# Patient Record
Sex: Male | Born: 1945
Health system: Southern US, Community
[De-identification: ages and names within clinical notes are randomized; demographics above are authoritative.]

## PROBLEM LIST (undated history)

## (undated) DIAGNOSIS — C9110 Chronic lymphocytic leukemia of B-cell type not having achieved remission: Secondary | ICD-10-CM

## (undated) DIAGNOSIS — N529 Male erectile dysfunction, unspecified: Secondary | ICD-10-CM

## (undated) DIAGNOSIS — R011 Cardiac murmur, unspecified: Secondary | ICD-10-CM

## (undated) DIAGNOSIS — I10 Essential (primary) hypertension: Secondary | ICD-10-CM

## (undated) DIAGNOSIS — S7290XA Unspecified fracture of unspecified femur, initial encounter for closed fracture: Secondary | ICD-10-CM

## (undated) DIAGNOSIS — I4949 Other premature depolarization: Secondary | ICD-10-CM

## (undated) DIAGNOSIS — E559 Vitamin D deficiency, unspecified: Secondary | ICD-10-CM

## (undated) DIAGNOSIS — H269 Unspecified cataract: Secondary | ICD-10-CM

## (undated) DIAGNOSIS — D509 Iron deficiency anemia, unspecified: Secondary | ICD-10-CM

## (undated) DIAGNOSIS — K635 Polyp of colon: Secondary | ICD-10-CM

## (undated) DIAGNOSIS — E669 Obesity, unspecified: Secondary | ICD-10-CM

## (undated) DIAGNOSIS — Z8719 Personal history of other diseases of the digestive system: Secondary | ICD-10-CM

## (undated) DIAGNOSIS — E785 Hyperlipidemia, unspecified: Secondary | ICD-10-CM

## (undated) DIAGNOSIS — E119 Type 2 diabetes mellitus without complications: Secondary | ICD-10-CM

## (undated) HISTORY — DX: Unspecified fracture of unspecified femur, initial encounter for closed fracture: S72.90XA

## (undated) HISTORY — DX: Obesity, unspecified: E66.9

## (undated) HISTORY — PX: HERNIA REPAIR: SHX51

## (undated) HISTORY — DX: Hyperlipidemia, unspecified: E78.5

## (undated) HISTORY — DX: Polyp of colon: K63.5

## (undated) HISTORY — PX: OTHER SURGICAL HISTORY: SHX169

## (undated) HISTORY — DX: Male erectile dysfunction, unspecified: N52.9

## (undated) HISTORY — DX: Iron deficiency anemia, unspecified: D50.9

## (undated) HISTORY — DX: Vitamin D deficiency, unspecified: E55.9

## (undated) HISTORY — DX: Unspecified cataract: H26.9

## (undated) HISTORY — DX: Essential (primary) hypertension: I10

## (undated) HISTORY — DX: Other premature depolarization: I49.49

## (undated) HISTORY — DX: Type 2 diabetes mellitus without complications: E11.9

---

## 1981-09-17 HISTORY — PX: OTHER SURGICAL HISTORY: SHX169

## 1990-09-17 DIAGNOSIS — E119 Type 2 diabetes mellitus without complications: Secondary | ICD-10-CM

## 1990-09-17 HISTORY — DX: Type 2 diabetes mellitus without complications: E11.9

## 1998-12-06 ENCOUNTER — Ambulatory Visit (HOSPITAL_COMMUNITY): Admission: RE | Admit: 1998-12-06 | Discharge: 1998-12-06 | Payer: Self-pay | Admitting: *Deleted

## 1999-12-18 ENCOUNTER — Ambulatory Visit (HOSPITAL_COMMUNITY): Admission: RE | Admit: 1999-12-18 | Discharge: 1999-12-18 | Payer: Self-pay | Admitting: *Deleted

## 2001-04-15 ENCOUNTER — Encounter: Payer: Self-pay | Admitting: Internal Medicine

## 2001-04-15 ENCOUNTER — Ambulatory Visit (HOSPITAL_COMMUNITY): Admission: RE | Admit: 2001-04-15 | Discharge: 2001-04-15 | Payer: Self-pay | Admitting: Internal Medicine

## 2001-06-10 ENCOUNTER — Encounter: Payer: Self-pay | Admitting: Gastroenterology

## 2005-01-02 ENCOUNTER — Encounter: Admission: RE | Admit: 2005-01-02 | Discharge: 2005-04-02 | Payer: Self-pay | Admitting: Internal Medicine

## 2008-08-10 ENCOUNTER — Ambulatory Visit (HOSPITAL_COMMUNITY): Admission: RE | Admit: 2008-08-10 | Discharge: 2008-08-10 | Payer: Self-pay | Admitting: Internal Medicine

## 2009-04-19 ENCOUNTER — Ambulatory Visit (HOSPITAL_COMMUNITY): Admission: RE | Admit: 2009-04-19 | Discharge: 2009-04-19 | Payer: Self-pay | Admitting: Internal Medicine

## 2009-09-28 ENCOUNTER — Encounter: Payer: Self-pay | Admitting: Cardiology

## 2009-10-19 ENCOUNTER — Ambulatory Visit: Payer: Self-pay | Admitting: Cardiology

## 2009-10-24 ENCOUNTER — Telehealth (INDEPENDENT_AMBULATORY_CARE_PROVIDER_SITE_OTHER): Payer: Self-pay

## 2009-10-25 ENCOUNTER — Ambulatory Visit: Payer: Self-pay | Admitting: Cardiology

## 2009-10-25 ENCOUNTER — Encounter (HOSPITAL_COMMUNITY): Admission: RE | Admit: 2009-10-25 | Discharge: 2009-12-21 | Payer: Self-pay | Admitting: Cardiology

## 2009-10-25 ENCOUNTER — Ambulatory Visit: Payer: Self-pay

## 2009-11-04 ENCOUNTER — Ambulatory Visit: Payer: Self-pay | Admitting: Cardiology

## 2009-11-22 ENCOUNTER — Encounter: Payer: Self-pay | Admitting: Gastroenterology

## 2010-01-04 ENCOUNTER — Ambulatory Visit: Payer: Self-pay | Admitting: Gastroenterology

## 2010-01-04 DIAGNOSIS — Z794 Long term (current) use of insulin: Secondary | ICD-10-CM

## 2010-01-04 DIAGNOSIS — E1122 Type 2 diabetes mellitus with diabetic chronic kidney disease: Secondary | ICD-10-CM | POA: Insufficient documentation

## 2010-01-04 DIAGNOSIS — N183 Chronic kidney disease, stage 3 (moderate): Secondary | ICD-10-CM

## 2010-03-02 ENCOUNTER — Ambulatory Visit: Payer: Self-pay | Admitting: Gastroenterology

## 2010-03-09 ENCOUNTER — Encounter: Payer: Self-pay | Admitting: Gastroenterology

## 2010-10-08 ENCOUNTER — Encounter: Payer: Self-pay | Admitting: Internal Medicine

## 2010-10-17 NOTE — Procedures (Signed)
Summary: Colonoscopy   Colonoscopy  Procedure date:  06/10/2001  Findings:      Location:  [facility].   Patient Name: Nathan, Davis MRN:  Procedure Procedures: Colorectal cancer screening, average risk CPT: G0121.  Personnel: Endoscopist: Barbette Hair. Arlyce Dice, MD.  Referred By: Marinus Maw, MD.  Exam Location: Exam performed in Outpatient Clinic. Outpatient  Patient Consent: Procedure, Alternatives, Risks and Benefits discussed, consent obtained, from patient.  Indications  Average Risk Screening Routine.  History  Pre-Exam Physical: Performed Jun 10, 2001. Cardio-pulmonary exam, Rectal exam, HEENT exam , Abdominal exam, Extremity exam, Neurological exam, Mental status exam WNL.  Exam Exam: Extent of exam reached: Cecum, extent intended: Cecum.  ASA Classification: II. Tolerance: good.  Monitoring: Pulse and BP monitoring, Oximetry used. Supplemental O2 given.  Colon Prep Used Golytely for colon prep. Prep results: good.  Sedation Meds: Patient assessed and found to be appropriate for moderate (conscious) sedation. Fentanyl 100 mcg. Versed 10 mg.  Findings DIVERTICULOSIS: Sigmoid Colon. ICD9: Sep 15, 1898.  DIVERTICULOSIS: Sigmoid Colon. ICD9: Sep 15, 1898.   Assessment Abnormal examination, see findings above.  Diagnoses: 562.10: Diverticulosis.   Events  Unplanned Interventions: No intervention was required.  Unplanned Events: There were no complications. Plans Patient Education: Patient given standard instructions for: a normal exam.  Disposition: After procedure patient sent to recovery. After recovery patient sent home.  Scheduling/Referral: Colonoscopy, to Barbette Hair. Arlyce Dice, MD, around Jun 11, 2011.    CC: Lucky Cowboy, MD  This report was created from the original endoscopy report, which was reviewed and signed by the above listed endoscopist.

## 2010-10-17 NOTE — Assessment & Plan Note (Signed)
Summary: np6/chest heaviness/premature beats/eval for stress test/jml   Primary Provider:  Dr. Elisabeth Most   History of Present Illness: 65 yo with history of diabetes, HTN, and hyperlipidemia presents for evaluation of heart fluttering.  Patient had some nasal congestion earlier this month so started taking Sudafed.  He began to develop fluttering in his chest after this.  The fluttering was documented at Dr. Hardie Pulley office to be PVCs.  He was started on atenolol with resolution of the flutters.  He is very active.  He can walk as far as he wants on flat ground or up a flight of steps without shortness of breath.  No chest pain or tightness.  No lightheadedness or syncope.    ECG: NSR at 56, 1st degree AV block, nonspecific ST changes  Labs (11/10): K 4.3, creatinine 1.01, LDL 64, HDL 37, TSH normal  Current Medications (verified): 1)  Metformin Hcl 500 Mg Tabs (Metformin Hcl) .... Take One Tablet Two Times A Day 2)  Loratadine 10 Mg Tabs (Loratadine) .... Take One Tablet Once Daily 3)  Lipitor 20 Mg Tabs (Atorvastatin Calcium) .... Take One Tablet Once Daily 4)  Slow Fe 160 (50 Fe) Mg Cr-Tabs (Ferrous Sulfate Dried) .... Once Daily 5)  Claritin 10 Mg Tabs (Loratadine) .... Take One Tablet As Needed 6)  Aspirin 81 Mg Tabs (Aspirin) .... Once Daily 7)  Atenolol 25 Mg Tabs (Atenolol) .... Take One Tablet Once Daily  Allergies (verified): 1)  ! * Actos 2)  ! Codeine  Past History:  Past Medical History: 1. PREMATURE BEATS (ICD-427.60) 2. HTN 3. Diabetes: since 1992 4. Hyperlipidemia 5. Obesity 6.  Fe-deficiency anemia  Family History: HTN Grandmother with MI in her 47s.   Social History: Never smoked.  Married with children.  Works for [employer] [place] at the [employer].   Review of Systems       All systems reviewed and negative except as per HPI.   Vital Signs:  Patient profile:   65 year old male Height:      68 inches Weight:      217 pounds BMI:      33.11 Pulse rate:   56 / minute Pulse rhythm:   regular BP sitting:   120 / 74  (left arm) Cuff size:   large  Vitals Entered By: Judithe Modest CMA (October 19, 2009 2:49 PM)  Physical Exam  General:  Well developed, well nourished, in no acute distress. Head:  normocephalic and atraumatic Nose:  no deformity, discharge, inflammation, or lesions Mouth:  Teeth, gums and palate normal. Oral mucosa normal. Neck:  Neck supple, no JVD. No masses, thyromegaly or abnormal cervical nodes. Lungs:  Clear bilaterally to auscultation and percussion. Heart:  Non-displaced PMI, chest non-tender; regular rate and rhythm, S1, S2 without murmurs, rubs or gallops. Carotid upstroke normal, no bruit.  Pedals normal pulses. No edema, no varicosities. Abdomen:  Bowel sounds positive; abdomen soft and non-tender without masses, organomegaly, or hernias noted. No hepatosplenomegaly. Msk:  Back normal, normal gait. Muscle strength and tone normal. Extremities:  No clubbing or cyanosis. Neurologic:  Alert and oriented x 3. Skin:  Intact without lesions or rashes. Psych:  Normal affect.   Impression & Recommendations:  Problem # 1:  PREMATURE BEATS (ICD-427.60) Patient was having frequent fluttering that appeared to be temporally linked to PVCs found on ECG at Dr. Hardie Pulley office.  Symptoms have resolved with atenolol.  Sudafed (contains phenylephrine) could have been a potential source for  the fluttering.  - Given PVCs + multiple cardiac risk factors including long-standing diabetes, I will risk stratify him with an ETT-myoview.  - Avoid over the counter cold meds.   Problem # 2:  HYPERLIPIDEMIA-MIXED (ICD-272.4) Lipids at goal in 11/10.   Other Orders: EKG w/ Interpretation (93000) Nuclear Stress Test (Nuc Stress Test)  Patient Instructions: 1)  Your physician has requested that you have an exercise stress myoview.  For further information please visit [email or web address]  Please follow  instruction sheet, as given. 2)  Your physician recommends that you schedule a follow-up appointment in: 2 weeks with Dr. Marca Ancona

## 2010-10-17 NOTE — Letter (Signed)
Summary: Diabetic Instructions  [facility]  [address] [place], [place] [address]   Phone: [phone]  Fax: [phone]    Nathan Davis 1946-06-11 MRN: [id number]   X    ORAL DIABETIC MEDICATION INSTRUCTIONS                                      METFORMIN The day before your procedure:   Take your diabetic pill as you do normally  The day of your procedure:   Do not take your diabetic pill    We will check your blood sugar levels during the admission process and again in Recovery before discharging you home  ________________________________________________________________________  _  _   INSULIN (LONG ACTING) MEDICATION INSTRUCTIONS (Lantus, NPH, 70/30, Humulin, Novolin-N)   The day before your procedure:   Take  your regular evening dose    The day of your procedure:   Do not take your morning dose    _  _   INSULIN (SHORT ACTING) MEDICATION INSTRUCTIONS (Regular, Humulog, Novolog)   The day before your procedure:   Do not take your evening dose   The day of your procedure:   Do not take your morning dose   _  _   INSULIN PUMP MEDICATION INSTRUCTIONS  We will contact the physician managing your diabetic care for written dosage instructions for the day before your procedure and the day of your procedure.  Once we have received the instructions, we will contact you.

## 2010-10-17 NOTE — Letter (Signed)
Summary: Moviprep Instructions  [facility]  [address] [facility] [address]   [place], [place] [address]   Phone: [phone]  Fax: [phone]       Nathan LarsenGEORGE Davis    1945/09/19    MRN: [id number]        Procedure Day /Date:THURSDAY 01/26/2010     Arrival Time:10:30AM     Procedure Time:11:30AM     Location of Procedure:                    X   [facility]oscopy Center (4th Floor)                        PREPARATION FOR COLONOSCOPY WITH MOVIPREP   Starting 5 days prior to your procedure 01/21/2010 do not eat nuts, seeds, popcorn, corn, beans, peas,  salads, or any raw vegetables.  Do not take any fiber supplements (e.g. Metamucil, Citrucel, and Benefiber).  THE DAY BEFORE YOUR PROCEDURE         DATE: 01/25/2010 DAY: WED  1.  Drink clear liquids the entire day-NO SOLID FOOD  2.  Do not drink anything colored red or purple.  Avoid juices with pulp.  No orange juice.  3.  Drink at least 64 oz. (8 glasses) of fluid/clear liquids during the day to prevent dehydration and help the prep work efficiently.  CLEAR LIQUIDS INCLUDE: Water Jello Ice Popsicles Tea (sugar ok, no milk/cream) Powdered fruit flavored drinks Coffee (sugar ok, no milk/cream) Gatorade Juice: apple, white grape, white cranberry  Lemonade Clear bullion, consomm, broth Carbonated beverages (any kind) Strained chicken noodle soup Hard Candy                             4.  In the morning, mix first dose of MoviPrep solution:    Empty 1 Pouch A and 1 Pouch B into the disposable container    Add lukewarm drinking water to the top line of the container. Mix to dissolve    Refrigerate (mixed solution should be used within 24 hrs)  5.  Begin drinking the prep at 5:00 p.m. The MoviPrep container is divided by 4 marks.   Every 15 minutes drink the solution down to the next mark (approximately 8 oz) until the full liter is complete.   6.  Follow completed prep with 16 oz of clear liquid of your choice (Nothing  red or purple).  Continue to drink clear liquids until bedtime.  7.  Before going to bed, mix second dose of MoviPrep solution:    Empty 1 Pouch A and 1 Pouch B into the disposable container    Add lukewarm drinking water to the top line of the container. Mix to dissolve    Refrigerate  THE DAY OF YOUR PROCEDURE      DATE: 01/26/2010 DAY: THURSDAY  Beginning at 6:30a.m. (5 hours before procedure):         1. Every 15 minutes, drink the solution down to the next mark (approx 8 oz) until the full liter is complete.  2. Follow completed prep with 16 oz. of clear liquid of your choice.    3. You may drink clear liquids until 9:30AM(2 HOURS BEFORE PROCEDURE).   MEDICATION INSTRUCTIONS  Unless otherwise instructed, you should take regular prescription medications with a small sip of water   as early as possible the morning of your procedure.  Diabetic patients - see separate instructions.  HOLD IRON 7 DAYS PRIOR TO PROCEDURE         OTHER INSTRUCTIONS  You will need a responsible adult at least 66 years of age to accompany you and drive you home.   This person must remain in the waiting room during your procedure.  Wear loose fitting clothing that is easily removed.  Leave jewelry and other valuables at home.  However, you may wish to bring a book to read or  an iPod/MP3 player to listen to music as you wait for your procedure to start.  Remove all body piercing jewelry and leave at home.  Total time from sign-in until discharge is approximately 2-3 hours.  You should go home directly after your procedure and rest.  You can resume normal activities the  day after your procedure.  The day of your procedure you should not:   Drive   Make legal decisions   Operate machinery   Drink alcohol   Return to work  You will receive specific instructions about eating, activities and medications before you leave.    The above instructions have been reviewed and  explained to me by   _______________________    I fully understand and can verbalize these instructions _____________________________ Date _________

## 2010-10-17 NOTE — Letter (Signed)
Summary: Results Letter  [facility]  [address] [place], [place] [address]   Phone: [phone]  Fax: [phone]        March 09, 2010 MRN: [id number]    Nathan Davis [address] [place], [place]  [address]    Dear Mr. Nathan Davis,  Your colon biopsy results did not show any remarkable findings. I recommend that you undergo followup surveillance colonloscopy in 10 years. Please continue with the recommendations previously discussed.  Should you have any further questions or immediate concers, feel free to contact me.  Sincerely,  Barbette Hair. Arlyce Dice, M.D., [facility], [place]          Sincerely,  Louis Meckel MD  This letter has been electronically signed by your physician.  Appended Document: Results Letter letter mailed 6.24.11

## 2010-10-17 NOTE — Assessment & Plan Note (Signed)
Summary: ABNORMAL LABS/YF   History of Present Illness Visit Type: consult  Primary GI MD: Melvia Heaps MD [facility] Primary Provider: Lovenia Kim, DO  Requesting Provider: Lucky Cowboy, MD Chief Complaint: Abnormal labs. Pt denies any GI complaints  History of Present Illness:   Nathan Davis is a pleasant 65 year old African American male referred at the request of Dr. Elisabeth Most for evaluation of anemia.  Routine lab work demonstrated a microcytic anemia.   On November 23, 2009 hemoglobin was 11.4 and MCV 69.6.  The patient has no GI complaints including change of bowel habits, abdominal pain, melena or hematochezia.  He is on no gastric irritants including nonsteroidals.  He takes iron.  He apparently tested Hemoccult positive.  Colonoscopy in 2002 demonstrated diverticula. The patient has non-insulin-dependent diabetes mellitus.   GI Review of Systems      Denies abdominal pain, acid reflux, belching, bloating, chest pain, dysphagia with liquids, dysphagia with solids, heartburn, loss of appetite, nausea, vomiting, vomiting blood, weight loss, and  weight gain.        Denies anal fissure, black tarry stools, change in bowel habit, constipation, diarrhea, diverticulosis, fecal incontinence, heme positive stool, hemorrhoids, irritable bowel syndrome, jaundice, light color stool, liver problems, rectal bleeding, and  rectal pain.    Current Medications (verified): 1)  Metformin Hcl 500 Mg Tabs (Metformin Hcl) .... Take 2  Tablet Two Times A Day 2)  Loratadine 10 Mg Tabs (Loratadine) .... Take One Tablet Once Daily 3)  Lipitor 20 Mg Tabs (Atorvastatin Calcium) .... Take One Tablet Once Daily 4)  Slow Fe 160 (50 Fe) Mg Cr-Tabs (Ferrous Sulfate Dried) .... Once Daily 5)  Aspirin 81 Mg Tabs (Aspirin) .... Once Daily 6)  Atenolol 25 Mg Tabs (Atenolol) .... Take One Tablet Once Daily 7)  Onglyza 5 Mg Tabs (Saxagliptin Hcl) .... One Tablet By Mouth Once Daily  Allergies (verified): 1)  ! *  Actos 2)  ! Codeine  Past History:  Past Medical History: Reviewed history from 11/04/2009 and no changes required. 1. PREMATURE BEATS (ICD-427.60) 2. HTN 3. Diabetes: since 1992 4. Hyperlipidemia 5. Obesity 6. Fe-deficiency anemia 7. ETT-myoview: 7'45", stopped due to fatigue, no chest pain.  EF 55%, no ischemia or infarction.   Past Surgical History: Hernia Surgery  Family History: HTN Grandmother with MI in her 57s.  No FH of Colon Cancer:  Social History: Never smoked.  Married with children.  Works for [employer] [place] at the [employer] park.  Daily Caffeine Use: 1-2 daily   Review of Systems       The patient complains of allergy/sinus.  The patient denies anemia, anxiety-new, arthritis/joint pain, back pain, blood in urine, breast changes/lumps, change in vision, confusion, cough, coughing up blood, depression-new, fainting, fatigue, fever, headaches-new, hearing problems, heart murmur, heart rhythm changes, itching, muscle pains/cramps, night sweats, nosebleeds, shortness of breath, skin rash, sleeping problems, sore throat, swelling of feet/legs, swollen lymph glands, thirst - excessive, urination - excessive, urination changes/pain, urine leakage, vision changes, and voice change.         All other systems were reviewed and were negative   Vital Signs:  Patient profile:   65 year old male Height:      68 inches Weight:      214 pounds BMI:     32.66 BSA:     2.11 Pulse rate:   76 / minute Pulse rhythm:   regular BP sitting:   136 / 82  (left arm) Cuff size:  regular  Vitals Entered By: Ok AnisKelly Smith CMA (January 04, 2010 8:50 AM)  Physical Exam  Additional Exam:  On physical exam he is a well-developed well-nourished male  skin: anicteric HEENT: normocephalic; PEERLA; no nasal or pharyngeal abnormalities neck: supple nodes: no cervical lymphadenopathy chest: clear to ausculatation and percussion heart: no murmurs, gallops, or rubs abd: soft,  nontender; BS normoactive; no abdominal masses, tenderness, organomegaly rectal: deferred ext: no cynanosis, clubbing, edema skeletal: no deformities neuro: oriented x 3; no focal abnormalities    Impression & Recommendations:  Problem # 1:  IRON DEFICIENCY (ICD-280.9)  Iron deficiency anemia with Hemoccult-positive stool points to chronic GI blood loss.  Bleeding sources including polyps, AVMs or neoplasm have to be ruled out.  Recommendations #1 colonoscopy #2 upper endoscopy if colonoscopy is negative  Risks, alternatives, and complications of the procedure, including bleeding, perforation, and possible need for surgery, were explained to the patient.  Patient's questions were answered.  Orders: Colonoscopy (Colon)  Problem # 2:  DM (ICD-250.00) Assessment: Comment Only  Patient Instructions: 1)  Colonoscopy and Flexible Sigmoidoscopy brochure given.  2)  Conscious Sedation brochure given.  3)  Your Colonoscopy is scheduled for 01/26/2010 at 11:30am 4)  You can pick up your MoviPrep from your pharmacy today 5)  cc Amy Elisabeth MostStevenson, DO 6)  The medication list was reviewed and reconciled.  All changed / newly prescribed medications were explained.  A complete medication list was provided to the patient / caregiver. Prescriptions: MOVIPREP 100 GM  SOLR (PEG-KCL-NACL-NASULF-NA ASC-C) As per prep instructions.  #1 x 0   Entered by:   Merri Rayobin Stallings CMA (AAMA)   Authorized by:   Louis Meckelobert D Birdie Beveridge MD   Signed by:   Merri Rayobin Stallings CMA (AAMA) on 01/04/2010   Method used:   [facility] to        [facility]  [address] [phone]* (retail)       [address]       [place]       [place], [place]  [id number]       Ph: [phone] or [phone]       Fax: [phone]   RxID:   [id number]

## 2010-10-17 NOTE — Assessment & Plan Note (Signed)
Summary: Cardiology Nuclear Study  Nuclear Med Background Indications for Stress Test: Evaluation for Ischemia  Indications Comments: PVC's documented per Dr.Stevenson's office.   History Comments: NO DOCUMENTED CAD  Symptoms: Chest Pain, Palpitations  Symptoms Comments: Last episode of CP:"heart burn"; 2 days ago.   Nuclear Pre-Procedure Cardiac Risk Factors: Hypertension, Lipids, NIDDM, Obesity Caffeine/Decaff Intake: None NPO After: 9:30 PM Lungs: Clear IV 0.9% NS with Angio Cath: 20g     IV Site: (R) AC IV Started by: Irean HongPatsy Edwards RN Chest Size (in) 44     Height (in): 68 Weight (lb): 208 BMI: 31.74 Tech Comments: Atenolol held x 36 hours.  Nuclear Med Study 1 or 2 day study:  1 day     Stress Test Type:  Stress Reading MD:  Willa RoughJeffrey Isamu Trammel, MD     Referring MD:  Marca Anconaalton McLean, MD Resting Radionuclide:  Technetium 4150m Tetrofosmin     Resting Radionuclide Dose:  11.0 mCi  Stress Radionuclide:  Technetium 5950m Tetrofosmin     Stress Radionuclide Dose:  32.0 mCi   Stress Protocol Exercise Time (min):  7:45 min     Max HR:  136 bpm     Predicted Max HR:  156 bpm  Max Systolic BP: 187 mm Hg     Percent Max HR:  87.18 %     METS: 8.6 Rate Pressure Product:  [id number]    Stress Test Technologist:  Rea CollegeSherri Tobin CMA-N     Nuclear Technologist:  Burna MortimerWanda Deal RT-N  Rest Procedure  Myocardial perfusion imaging was performed at rest 45 minutes following the intravenous administration of Myoview Technetium 6150m Tetrofosmin.  Stress Procedure  The patient exercised for 7:45.  The patient stopped due to fatigue and denied any chest pain.  There were no significant ST-T wave changes, only occasional PVC's.  Myoview was injected at peak exercise and myocardial perfusion imaging was performed after a brief delay.  QPS Raw Data Images:  Patient motion noted; appropriate software correction applied. Stress Images:  There is normal uptake in all areas. Rest Images:  Normal homogeneous uptake in  all areas of the myocardium. Subtraction (SDS):  No evidence of ischemia. Transient Ischemic Dilatation:  1.05  (Normal <1.22)  Lung/Heart Ratio:  .27  (Normal <0.45)  Quantitative Gated Spect Images QGS EDV:  107 ml QGS ESV:  48 ml QGS EF:  55 % QGS cine images:  Normal motion  Findings Normal nuclear study      Overall Impression  Exercise Capacity: Fair exercise capacity. BP Response: Normal blood pressure response. Clinical Symptoms: No chest pain ECG Impression: No significant ST segment change suggestive of ischemia. Overall Impression: Normal stress nuclear study.  Appended Document: Cardiology Nuclear Study normal  Appended Document: Cardiology Nuclear Study PT AWARE./CY

## 2010-10-17 NOTE — Assessment & Plan Note (Signed)
Summary: PER CHECK OUT/SF   Primary Provider:  Dr. Elisabeth MostStevenson  CC:  pt has no cardiac concerns at this time.  History of Present Illness: 65 yo with history of diabetes, HTN, and hyperlipidemia presents for evaluation of heart fluttering.  Patient had some nasal congestion earlier this month so started taking Sudafed.  He began to develop fluttering in his chest after this.  The fluttering was documented at Dr. Hardie PulleyStevenson's office to be PVCs.  He was started on atenolol with resolution of the flutters.  He is very active.  He can walk as far as he wants on flat ground or up a flight of steps without shortness of breath.  No chest pain or tightness.  No lightheadedness or syncope.    Given PVCs and multiple cardiac risk factors, we did an ETT-myoview which showed no ischemia or infarction.  Since starting atenolol, he has had no further flutters/palpitations.    Labs (11/10): K 4.3, creatinine 1.01, LDL 64, HDL 37, TSH normal  Current Medications (verified): 1)  Metformin Hcl 500 Mg Tabs (Metformin Hcl) .... Take One Tablet Two Times A Day 2)  Loratadine 10 Mg Tabs (Loratadine) .... Take One Tablet Once Daily 3)  Lipitor 20 Mg Tabs (Atorvastatin Calcium) .... Take One Tablet Once Daily 4)  Slow Fe 160 (50 Fe) Mg Cr-Tabs (Ferrous Sulfate Dried) .... Once Daily 5)  Claritin 10 Mg Tabs (Loratadine) .... Take One Tablet As Needed 6)  Aspirin 81 Mg Tabs (Aspirin) .... Once Daily 7)  Atenolol 25 Mg Tabs (Atenolol) .... Take One Tablet Once Daily  Allergies (verified): 1)  ! * Actos 2)  ! Codeine  Past History:  Past Medical History: 1. PREMATURE BEATS (ICD-427.60) 2. HTN 3. Diabetes: since 1992 4. Hyperlipidemia 5. Obesity 6. Fe-deficiency anemia 7. ETT-myoview: 7'45", stopped due to fatigue, no chest pain.  EF 55%, no ischemia or infarction.   Family History: Reviewed history from 10/19/2009 and no changes required. HTN Grandmother with MI in her 2680s.   Social History: Reviewed  history from 10/19/2009 and no changes required. Never smoked.  Married with children.  [place] for [employer] [place] [employer].   Vital Signs:  Patient profile:   65 year old male Height:      68 inches Weight:      215 pounds BMI:     32.81 Pulse rate:   71 / minute Pulse rhythm:   regular BP sitting:   130 / 70  (left arm) Cuff size:   large  Vitals Entered By: Judithe ModestAmanda Trulove CMA (November 04, 2009 2:13 PM)  Physical Exam  General:  Well developed, well nourished, in no acute distress. Neck:  Neck supple, no JVD. No masses, thyromegaly or abnormal cervical nodes. Lungs:  Clear bilaterally to auscultation and percussion. Heart:  Non-displaced PMI, chest non-tender; regular rate and rhythm, S1, S2 without murmurs, rubs or gallops. Carotid upstroke normal, no bruit.  Pedals normal pulses. No edema, no varicosities. Abdomen:  Bowel sounds positive; abdomen soft and non-tender without masses, organomegaly, or hernias noted. No hepatosplenomegaly. Extremities:  No clubbing or cyanosis. Neurologic:  Alert and oriented x 3. Psych:  Normal affect.   Impression & Recommendations:  Problem # 1:  PREMATURE BEATS (ICD-427.60) Patient was having frequent fluttering that appeared to be temporally linked to PVCs found on ECG at Dr. Hardie PulleyStevenson's office.  Symptoms have resolved with atenolol.  Sudafed (contains phenylephrine) could have been a potential source for the fluttering.  -  Given PVCs + multiple cardiac risk factors including long-standing diabetes, we did a myoview which showed no ischemia or infarction.   - Avoid over the counter cold meds.  - as needed followup.

## 2010-10-17 NOTE — Letter (Signed)
Summary: [facility]  [facility]   Imported By: Roderic Ovens 10/28/2009 13:25:55  _____________________________________________________________________  External Attachment:    Type:   Image     Comment:   External Document

## 2010-10-17 NOTE — Progress Notes (Signed)
Summary: Nuc.Pre-Procedure  Phone Note Outgoing Call Call back at [facility] Phone [phone]   Call placed by: Irean Hong, RN,  October 24, 2009 3:25 PM Summary of Call: Reviewed information on Myoview Information Sheet (see scanned document for further details).  Spoke with patient per [employer].     Nuclear Med Background Indications for Stress Test: Evaluation for Ischemia  Indications Comments: PVC's documented per Dr.Stevenson's office.    Symptoms: Palpitations    Nuclear Pre-Procedure Cardiac Risk Factors: Hypertension, Lipids, NIDDM Height (in): 68

## 2010-10-17 NOTE — Procedures (Signed)
Summary: Colonoscopy  Patient: Nathan Davis Note: All result statuses are Final unless otherwise noted.  Tests: (1) Colonoscopy (COL)   COL Colonoscopy           DONE     [facility]     [address]     [place], [place]  [address]           COLONOSCOPY PROCEDURE REPORT           PATIENT:  Nathan Davis, Nathan Davis  MR#:  [id number]     BIRTHDATE:  [birth year], 63 yrs. old  GENDER:  male           ENDOSCOPIST:  Barbette Hair. Arlyce Dice, MD     Referred by:           PROCEDURE DATE:  03/02/2010     PROCEDURE:  Colonoscopy with snare polypectomy     ASA CLASS:  Class II     INDICATIONS:  1) heme positive stool  2) iron deficiency anemia           MEDICATIONS:   Fentanyl 50 mcg IV, Versed 6 mg IV           DESCRIPTION OF PROCEDURE:   After the risks benefits and     alternatives of the procedure were thoroughly explained, informed     consent was obtained.  Digital rectal exam was performed and     revealed no abnormalities.   The LB CF-[id number] endoscope     was introduced through the anus and advanced to the cecum, which     was identified by both the appendix and ileocecal valve, limited     by poor preparation.  Moderate amount of retained thick stool  The     quality of the prep was Moviprep fair.  The instrument was then     slowly withdrawn as the colon was fully examined.     <<PROCEDUREIMAGES>>           FINDINGS:  A sessile polyp was found in the descending colon. It     was 3 mm in size. Polyp was snared without cautery. Retrieval was     successful (see image9). snare polyp Nonbleeding polyp  Scattered     diverticula were found in the sigmoid to descending colon segments     (see image7).  This was otherwise a normal examination of the     colon (see image3, image4, image5, image6, image10, image12, and     image13).   Retroflexed views in the rectum revealed no     abnormalities.    The time to cecum =  8.0  minutes. The scope was     then withdrawn (time =   12.50  min) from the patient and the     procedure completed.           COMPLICATIONS:  None           ENDOSCOPIC IMPRESSION:     1) Nonbleeding 3 mm sessile polyp in the descending colon     2) Diverticula, scattered in the sigmoid to descending colon     segments     3) Otherwise normal examination     RECOMMENDATIONS:     1) If the polyp(s) removed today are proven to be adenomatous     (pre-cancerous) polyps, you will need a repeat colonoscopy in 5     years. Otherwise you should continue to follow colorectal cancer     screening  guidelines for "routine risk" patients with colonoscopy     in 10 years.     2) Upper endoscopy will be scheduled           REPEAT EXAM:   You will receive a letter from Dr. Arlyce DiceKaplan in 1-2     weeks, after reviewing the final pathology, with followup     recommendations.           ______________________________     Barbette Hairobert D. Arlyce DiceKaplan, MD           CC: Lucky CowboyWilliam [name], MD           n.     Rosalie DoctoreSIGNED:   Barbette Hairobert D. Kynlie Jane at 03/02/2010 11:50 AM           Duard Larsenhomasson, [place], [phone]  Note: An exclamation mark (!) indicates a result that was not dispersed into the flowsheet. Document Creation Date: 03/02/2010 11:50 AM _______________________________________________________________________  (1) Order result status: Final Collection or observation date-time: 03/02/2010 11:44 Requested date-time:  Receipt date-time:  Reported date-time:  Referring Physician:   Ordering Physician: Melvia Heapsobert Mavis Gravelle [phone]([id number] Specimen Source:  Source: Launa GrillEndoProS Filler Order Number: [id number] Lab site:   Appended Document: Colonoscopy 10 yr recall     Procedures Next Due Date:    Colonoscopy: 02/2020

## 2010-12-03 LAB — GLUCOSE, CAPILLARY
Glucose-Capillary: 110 mg/dL — ABNORMAL HIGH (ref 70–99)
Glucose-Capillary: 144 mg/dL — ABNORMAL HIGH (ref 70–99)

## 2011-04-04 ENCOUNTER — Ambulatory Visit (HOSPITAL_COMMUNITY)
Admission: RE | Admit: 2011-04-04 | Discharge: 2011-04-04 | Disposition: A | Discharge status: Home / Self Care | Payer: Medicare Other | Source: Ambulatory Visit | Attending: Internal Medicine | Admitting: Internal Medicine

## 2011-04-04 ENCOUNTER — Other Ambulatory Visit (HOSPITAL_COMMUNITY): Payer: Self-pay | Admitting: Internal Medicine

## 2011-04-04 DIAGNOSIS — R1110 Vomiting, unspecified: Secondary | ICD-10-CM | POA: Insufficient documentation

## 2012-08-26 ENCOUNTER — Encounter: Payer: Self-pay | Admitting: Cardiology

## 2012-09-19 ENCOUNTER — Encounter: Payer: Self-pay | Admitting: *Deleted

## 2012-09-26 ENCOUNTER — Ambulatory Visit: Payer: Medicare Other | Admitting: Cardiology

## 2012-11-04 ENCOUNTER — Ambulatory Visit: Payer: Medicare Other | Admitting: Cardiology

## 2012-11-14 ENCOUNTER — Ambulatory Visit (INDEPENDENT_AMBULATORY_CARE_PROVIDER_SITE_OTHER): Payer: Medicare Other | Admitting: Cardiology

## 2012-11-14 ENCOUNTER — Encounter: Payer: Self-pay | Admitting: Cardiology

## 2012-11-14 VITALS — BP 114/80 | HR 58 | Ht 68.0 in | Wt 229.0 lb

## 2012-11-14 DIAGNOSIS — I4949 Other premature depolarization: Secondary | ICD-10-CM

## 2012-11-14 DIAGNOSIS — E785 Hyperlipidemia, unspecified: Secondary | ICD-10-CM

## 2012-11-14 DIAGNOSIS — R0789 Other chest pain: Secondary | ICD-10-CM

## 2012-11-14 NOTE — Patient Instructions (Signed)
Your physician wants you to follow-up in: 1 year with Dr McLean. (February 2015).  You will receive a reminder letter in the mail two months in advance. If you don't receive a letter, please call our office to schedule the follow-up appointment.  

## 2012-11-16 NOTE — Progress Notes (Signed)
Patient ID: Nathan Davis, male   DOB: 06/20/1946, 67 y.o.   MRN: [id number] PCP: Dr. Oneta Rack  67 yo with history of diabetes, HTN, and hyperlipidemia presents for cardiology followup.  He has a history of PVCs and a normal myoview in 2011.  He has done well symptomatically recently.  He has had only rare palpitations.  No chest pain. He has occasional epigastric discomfort and belching after eating, especially when he lies down at night.  No exertional dyspnea.    Labs (11/10): K 4.3, creatinine 1.01, LDL 64, HDL 37, TSH normal   ECG: NSR, anterior T wave inversions  Allergies (verified):  1) ! * Actos  2) ! Codeine   Past Medical History:  1. PVCs 2. HTN  3. Diabetes: since 1992  4. Hyperlipidemia  5. Obesity  6. Fe-deficiency anemia  7. ETT-myoview (2011): 7'45", stopped due to fatigue, no chest pain. EF 55%, no ischemia or infarction.   Family History:  HTN  Grandmother with MI in her 43s.   Social History:  Never smoked. Married with children. 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   Current Outpatient Prescriptions  Medication Sig Dispense Refill  . aspirin 81 MG tablet Take 81 mg by mouth daily.      Marland Kitchen atenolol (TENORMIN) 25 MG tablet Take 25 mg by mouth daily.      Marland Kitchen atorvastatin (LIPITOR) 20 MG tablet Take 20 mg by mouth daily.      . ferrous sulfate dried (SLOW FE) 160 (50 FE) MG TBCR Take 160 mg by mouth daily.      Marland Kitchen LEVEMIR FLEXPEN 100 UNIT/ML injection Inject into the skin as directed.       . loratadine (CLARITIN) 10 MG tablet Take 10 mg by mouth daily.      . metFORMIN (GLUCOPHAGE) 500 MG tablet Take 500 mg by mouth 2 (two) times daily with a meal.      . saxagliptin HCl (ONGLYZA) 5 MG TABS tablet Take 5 mg by mouth daily.       No current facility-administered medications for this visit.    BP 114/80  Pulse 58  Ht 5\' 8"  (1.727 m)  Wt 229 lb (103.874 kg)  BMI 34.83 kg/m2 General: NAD Neck: No JVD, no thyromegaly or thyroid nodule.   Lungs: Clear to auscultation bilaterally with normal respiratory effort. CV: Nondisplaced PMI.  Heart regular S1/S2, no S3/S4, no murmur.  No peripheral edema.  No carotid bruit.  Normal pedal pulses.  Abdomen: Soft, nontender, no hepatosplenomegaly Neurologic: Alert and oriented x 3.  Psych: Normal affect. Extremities: No clubbing or cyanosis.   Assessment/Plan: 1. PVCs: Minimal palpitations.  He is on atenolol. 2. HTN: BP is well-controlled.  3. Hyperlipidemia: Will call Dr. Kathryne Sharper office for a copy of most recent lipids.  Given diabetes, he should continue statin.  4. Indigestion/belching: This sounds like GERD.  I suggested that he start with using over-there-counter Prilosec.   Marca Ancona 11/16/2012

## 2013-07-11 ENCOUNTER — Encounter: Payer: Self-pay | Admitting: Internal Medicine

## 2013-07-22 ENCOUNTER — Ambulatory Visit: Payer: Self-pay | Admitting: Internal Medicine

## 2013-07-22 ENCOUNTER — Other Ambulatory Visit: Payer: Medicare Other

## 2013-07-22 DIAGNOSIS — R7989 Other specified abnormal findings of blood chemistry: Secondary | ICD-10-CM

## 2013-07-22 DIAGNOSIS — Z23 Encounter for immunization: Secondary | ICD-10-CM

## 2013-07-22 DIAGNOSIS — IMO0001 Reserved for inherently not codable concepts without codable children: Secondary | ICD-10-CM

## 2013-07-22 LAB — CBC WITH DIFFERENTIAL/PLATELET
Basophils Absolute: 0.1 10*3/uL (ref 0.0–0.1)
Basophils Relative: 2 % — ABNORMAL HIGH (ref 0–1)
Eosinophils Absolute: 0.4 10*3/uL (ref 0.0–0.7)
Eosinophils Relative: 5 % (ref 0–5)
HCT: 38 % — ABNORMAL LOW (ref 39.0–52.0)
Hemoglobin: 12.4 g/dL — ABNORMAL LOW (ref 13.0–17.0)
Lymphocytes Relative: 57 % — ABNORMAL HIGH (ref 12–46)
Lymphs Abs: 4.7 10*3/uL — ABNORMAL HIGH (ref 0.7–4.0)
MCH: 22.7 pg — ABNORMAL LOW (ref 26.0–34.0)
MCHC: 32.6 g/dL (ref 30.0–36.0)
MCV: 69.5 fL — ABNORMAL LOW (ref 78.0–100.0)
Monocytes Absolute: 0.7 10*3/uL (ref 0.1–1.0)
Monocytes Relative: 8 % (ref 3–12)
Neutro Abs: 2.3 10*3/uL (ref 1.7–7.7)
Neutrophils Relative %: 28 % — ABNORMAL LOW (ref 43–77)
Platelets: 333 10*3/uL (ref 150–400)
RBC: 5.47 MIL/uL (ref 4.22–5.81)
RDW: 16.7 % — ABNORMAL HIGH (ref 11.5–15.5)
WBC: 8.2 10*3/uL (ref 4.0–10.5)

## 2013-07-22 LAB — BASIC METABOLIC PANEL WITH GFR
BUN: 15 mg/dL (ref 6–23)
CO2: 29 mEq/L (ref 19–32)
Calcium: 9.6 mg/dL (ref 8.4–10.5)
Chloride: 101 mEq/L (ref 96–112)
Creat: 1.03 mg/dL (ref 0.50–1.35)
GFR, Est African American: 86 mL/min
GFR, Est Non African American: 75 mL/min
Glucose, Bld: 75 mg/dL (ref 70–99)
Potassium: 4.9 mEq/L (ref 3.5–5.3)
Sodium: 136 mEq/L (ref 135–145)

## 2013-07-22 NOTE — Addendum Note (Signed)
Addended by: Valrie Hart C on: 07/22/2013 03:59 PM   Modules accepted: Orders

## 2013-07-23 ENCOUNTER — Telehealth: Payer: Self-pay | Admitting: *Deleted

## 2013-07-23 NOTE — Telephone Encounter (Signed)
Message copied by Laurence Spates on Thu Jul 23, 2013  5:40 PM ------      Message from: [place], [place] R      Created: Thu Jul 23, 2013  9:33 AM       All labs stable. Continue RX same needs Recheck with Mck in 49m OV ------

## 2013-07-23 NOTE — Telephone Encounter (Signed)
Spoke with pt about lab results from 07/22/2013.  Informed per Loree Fee, PA-C that labs stable and pt will f/u with Dr. Oneta Rack for CPE

## 2013-07-28 ENCOUNTER — Telehealth: Payer: Self-pay | Admitting: Emergency Medicine

## 2013-07-28 ENCOUNTER — Other Ambulatory Visit: Payer: Self-pay | Admitting: *Deleted

## 2013-07-28 DIAGNOSIS — E109 Type 1 diabetes mellitus without complications: Secondary | ICD-10-CM

## 2013-07-28 MED ORDER — INSULIN GLARGINE 100 UNIT/ML SOLOSTAR PEN: 40.0000 [IU] | PEN_INJECTOR | Freq: Once | SUBCUTANEOUS | Status: DC

## 2013-07-28 NOTE — Telephone Encounter (Signed)
Pt called wants samples on levemir pen Call when ready at [phone] Sending chart back

## 2013-07-30 ENCOUNTER — Other Ambulatory Visit: Payer: Self-pay | Admitting: Internal Medicine

## 2013-08-03 ENCOUNTER — Telehealth: Payer: Self-pay | Admitting: *Deleted

## 2013-08-03 MED ORDER — INSULIN DETEMIR 100 UNIT/ML FLEXPEN: 10.0000 [IU] | Freq: Every day | SUBCUTANEOUS | Status: DC

## 2013-08-20 NOTE — Telephone Encounter (Signed)
DONE

## 2013-08-27 ENCOUNTER — Encounter: Payer: Self-pay | Admitting: Emergency Medicine

## 2013-08-27 ENCOUNTER — Ambulatory Visit (INDEPENDENT_AMBULATORY_CARE_PROVIDER_SITE_OTHER): Payer: Medicare Other | Admitting: Emergency Medicine

## 2013-08-27 VITALS — BP 102/60 | HR 52 | Temp 98.0°F | Resp 16 | Ht 67.5 in | Wt 220.0 lb

## 2013-08-27 DIAGNOSIS — E538 Deficiency of other specified B group vitamins: Secondary | ICD-10-CM

## 2013-08-27 DIAGNOSIS — Z23 Encounter for immunization: Secondary | ICD-10-CM

## 2013-08-27 DIAGNOSIS — R5381 Other malaise: Secondary | ICD-10-CM

## 2013-08-27 DIAGNOSIS — E782 Mixed hyperlipidemia: Secondary | ICD-10-CM

## 2013-08-27 DIAGNOSIS — Z125 Encounter for screening for malignant neoplasm of prostate: Secondary | ICD-10-CM

## 2013-08-27 DIAGNOSIS — Z0000 Encounter for general adult medical examination without abnormal findings: Secondary | ICD-10-CM

## 2013-08-27 DIAGNOSIS — Z1212 Encounter for screening for malignant neoplasm of rectum: Secondary | ICD-10-CM

## 2013-08-27 DIAGNOSIS — E1149 Type 2 diabetes mellitus with other diabetic neurological complication: Secondary | ICD-10-CM

## 2013-08-27 DIAGNOSIS — D649 Anemia, unspecified: Secondary | ICD-10-CM

## 2013-08-27 DIAGNOSIS — Z111 Encounter for screening for respiratory tuberculosis: Secondary | ICD-10-CM

## 2013-08-27 DIAGNOSIS — I10 Essential (primary) hypertension: Secondary | ICD-10-CM

## 2013-08-27 LAB — CBC WITH DIFFERENTIAL/PLATELET
Basophils Absolute: 0.1 10*3/uL (ref 0.0–0.1)
Basophils Relative: 1 % (ref 0–1)
Eosinophils Absolute: 0.4 10*3/uL (ref 0.0–0.7)
Eosinophils Relative: 5 % (ref 0–5)
HCT: 38.8 % — ABNORMAL LOW (ref 39.0–52.0)
Hemoglobin: 12.6 g/dL — ABNORMAL LOW (ref 13.0–17.0)
Lymphocytes Relative: 65 % — ABNORMAL HIGH (ref 12–46)
Lymphs Abs: 5.5 10*3/uL — ABNORMAL HIGH (ref 0.7–4.0)
MCH: 22.9 pg — ABNORMAL LOW (ref 26.0–34.0)
MCHC: 32.5 g/dL (ref 30.0–36.0)
MCV: 70.4 fL — ABNORMAL LOW (ref 78.0–100.0)
Monocytes Absolute: 0.7 10*3/uL (ref 0.1–1.0)
Monocytes Relative: 8 % (ref 3–12)
Neutro Abs: 1.9 10*3/uL (ref 1.7–7.7)
Neutrophils Relative %: 21 % — ABNORMAL LOW (ref 43–77)
Platelets: 320 10*3/uL (ref 150–400)
RBC: 5.51 MIL/uL (ref 4.22–5.81)
RDW: 16.7 % — ABNORMAL HIGH (ref 11.5–15.5)
WBC: 8.4 10*3/uL (ref 4.0–10.5)

## 2013-08-27 LAB — BASIC METABOLIC PANEL WITH GFR
BUN: 18 mg/dL (ref 6–23)
CO2: 29 mEq/L (ref 19–32)
Calcium: 9.5 mg/dL (ref 8.4–10.5)
Chloride: 103 mEq/L (ref 96–112)
Creat: 1.17 mg/dL (ref 0.50–1.35)
GFR, Est African American: 74 mL/min
GFR, Est Non African American: 64 mL/min
Glucose, Bld: 58 mg/dL — ABNORMAL LOW (ref 70–99)
Potassium: 4.3 mEq/L (ref 3.5–5.3)
Sodium: 139 mEq/L (ref 135–145)

## 2013-08-27 LAB — IRON AND TIBC
%SAT: 20 % (ref 20–55)
Iron: 70 ug/dL (ref 42–165)
TIBC: 343 ug/dL (ref 215–435)
UIBC: 273 ug/dL (ref 125–400)

## 2013-08-27 LAB — VITAMIN B12: Vitamin B-12: 521 pg/mL (ref 211–911)

## 2013-08-27 NOTE — Patient Instructions (Signed)

## 2013-08-28 ENCOUNTER — Telehealth: Payer: Self-pay | Admitting: *Deleted

## 2013-08-28 LAB — URINALYSIS, MICROSCOPIC ONLY
Bacteria, UA: NONE SEEN
Casts: NONE SEEN
Crystals: NONE SEEN
Squamous Epithelial / HPF: NONE SEEN

## 2013-08-28 LAB — URINALYSIS, ROUTINE W REFLEX MICROSCOPIC
Bilirubin Urine: NEGATIVE
Glucose, UA: >1000 mg/dL — AB
Hgb urine dipstick: NEGATIVE
Ketones, ur: NEGATIVE mg/dL
Leukocytes, UA: NEGATIVE
Nitrite: NEGATIVE
Protein, ur: NEGATIVE mg/dL
Specific Gravity, Urine: 1.025 (ref 1.005–1.030)
Urobilinogen, UA: 1 mg/dL (ref 0.0–1.0)
pH: 5 (ref 5.0–8.0)

## 2013-08-28 LAB — MICROALBUMIN / CREATININE URINE RATIO
Creatinine, Urine: 158.5 mg/dL
Microalb Creat Ratio: 3.2 mg/g (ref 0.0–30.0)
Microalb, Ur: 0.5 mg/dL (ref 0.00–1.89)

## 2013-08-28 LAB — PSA: PSA: 0.72 ng/mL (ref <=4.00)

## 2013-08-28 LAB — TESTOSTERONE: Testosterone: 267 ng/dL — ABNORMAL LOW (ref 300–890)

## 2013-08-28 NOTE — Telephone Encounter (Signed)
Message copied by Nicholaus Corolla A on Fri Aug 28, 2013 10:09 AM ------      Message from: [place], [place] R      Created: Fri Aug 28, 2013  6:26 AM       + sugar in urine from [place] WNL, BS was low make sure he is eating more protein, small portions all day no big meals. CBC stable. Testosterone is low can consider RX may help with fatigue(if no [facility] or prostate CA). If starts needs 6 week OV. Needs 3 month OV with MCK ------

## 2013-08-31 LAB — TB SKIN TEST
Induration: 0 mm
TB Skin Test: NEGATIVE

## 2013-08-31 NOTE — Progress Notes (Signed)
Subjective:    Patient ID: Nathan Davis, male    DOB: [birth year], 67 y.o.   MRN: [id number]  HPI Comments: 67 yo AAM CPE presents for 3 month F/U for HTN, Cholesterol, IDDm, D. Deficient. He has been doing well over all. He is trying to improve diet and wt loss. He is walking QD for exercise. He notes BP occasionally low. BS have been improving he is averaging around 160s, with occasional lows. He notes he occasionally has a little tingling in both feet, usually after prolonged sitting. He notes occasional dizziness, usually with position changes. He denies checking BP/ BS with these episodes. He notes episodes resolve quickly.   LAST LABS BS 134 T 118 TG 99 H 29 L 69 MAG 1.8 A1C 8.7 D 39 He has added [place]xiga AD and is slowly trying to decrease Levemir. He is down 4# since visit. His CBC has been mildly abnormal but stable over last several visits.  He has EYE exam 08/2013 and Cardio eval 10/2013.    Hypertension  Hyperlipidemia  Diabetes Hypoglycemia symptoms include dizziness.    Current Outpatient Prescriptions on File Prior to Visit  Medication Sig Dispense Refill  . aspirin 81 MG tablet Take 81 mg by mouth daily.      Marland Kitchen. atenolol (TENORMIN) 25 MG tablet Take 25 mg by mouth daily.      Marland Kitchen. LEVEMIR FLEXPEN 100 UNIT/ML SOPN INJECT 40-50 UNITS SUBCUTANEOUSLY DAILY AS DIRECTED  5 pen  5  . loratadine (CLARITIN) 10 MG tablet Take 10 mg by mouth daily.      . metFORMIN (GLUCOPHAGE) 500 MG tablet Take 1,000 mg by mouth 2 (two) times daily with a meal.       . saxagliptin HCl (ONGLYZA) 5 MG TABS tablet Take 5 mg by mouth daily.      . ferrous sulfate dried (SLOW FE) 160 (50 FE) MG TBCR Take 160 mg by mouth daily.      . Insulin Glargine 100 UNIT/ML SOPN Inject 40 Units into the skin once.  1 pen  0   No current facility-administered medications on file prior to visit.   ALLERGIES Amaryl; Codeine; and Pioglitazone  Past Medical History  Diagnosis Date  . Premature beats,  unspecified   . Unspecified essential hypertension   . Other and unspecified hyperlipidemia   . Diabetes 1992  . Iron (Fe) deficiency anemia   . Obesity   . Cataract   . Vitamin D deficiency   . Colon polyps   . Femur fracture     left  . ED (erectile dysfunction)     Past Surgical History  Procedure Laterality Date  . Ett - myoview      7'45", stopped due to fatigue, no chest pain. EF55%, no ischemia or infarction  . Hernia repair    . [employer] fundoplication N/A 09/17/1981   History  Substance Use Topics  . Smoking status: Never Smoker   . Smokeless tobacco: Never Used  . Alcohol Use: No    Family History  Problem Relation Age of Onset  . Hypertension    . Heart attack  80  . Cancer - Other Mother     throat  . Alzheimer's disease Father       Review of Systems  Eyes:       DR Ephriam JenkinsSHAPIRO YEARLY EXAM SCHEDULED 08/2013  Cardiovascular:       DR Jearld PiesMCCLEAN F/U 10/2013  Neurological: Positive for dizziness and numbness.  All other systems  reviewed and are negative.    BP 102/60  Pulse 52  Temp(Src) 98 F (36.7 C) (Temporal)  Resp 16  Ht 5' 7.5" (1.715 m)  Wt 220 lb (99.791 kg)  BMI 33.93 kg/m2     Objective:   Physical Exam  Nursing note and vitals reviewed. Constitutional: He is oriented to person, place, and time. He appears well-developed and well-nourished.  HENT:  Head: Normocephalic and atraumatic.  Right Ear: External ear normal.  Left Ear: External ear normal.  Nose: Nose normal.  Eyes: Conjunctivae and EOM are normal. Pupils are equal, round, and reactive to light. Right eye exhibits no discharge. Left eye exhibits no discharge. No scleral icterus.  Neck: Normal range of motion. Neck supple. No JVD present. No tracheal deviation present. No thyromegaly present.  Cardiovascular: Normal rate, regular rhythm, normal heart sounds and intact distal pulses.   Pulmonary/Chest: Effort normal and breath sounds normal.  Abdominal: Soft. Bowel sounds are  normal. He exhibits no distension and no mass. There is no tenderness. There is no rebound and no guarding.  Genitourinary: Rectum normal and prostate normal. Guaiac negative stool.  Musculoskeletal: Normal range of motion. He exhibits no edema and no tenderness.  Lymphadenopathy:    He has no cervical adenopathy.  Neurological: He is alert and oriented to person, place, and time. He has normal reflexes. No cranial nerve deficit. He exhibits normal muscle tone. Coordination normal.  No obvious deficit  Skin: Skin is warm and dry. No rash noted. No erythema. No pallor.  Psychiatric: He has a normal mood and affect. His behavior is normal. Judgment and thought content normal.     EKG SINUS BRADY WITH 1 PVC Will send to Dr. Shirlee Latch for recheck      Assessment & Plan:  1. CPE and 3 month F/U for HTN, Cholesterol, IDDm, D. Deficient. Needs healthy diet, cardio QD and obtain healthy  160 call office.  Check labs. 2. ? Diabetic Peripheral neuropathy- patient will continue to monitor w/c with symptoms may need Neuro and podiatry eval. 3. Dizziness- Concern for hypotension vs hypoglycemia, will check BS/ BP with episodes and call with list. Decrease Atenolol 25 to 1/2 QD.

## 2013-09-01 NOTE — Telephone Encounter (Signed)
Spoke with patient about lab results and instructions. 

## 2013-10-15 ENCOUNTER — Other Ambulatory Visit: Payer: Self-pay | Admitting: Emergency Medicine

## 2013-10-15 MED ORDER — INSULIN PEN NEEDLE 31G X 8 MM MISC: Status: DC

## 2013-11-24 ENCOUNTER — Ambulatory Visit: Payer: Medicare Other | Admitting: Cardiology

## 2013-12-02 ENCOUNTER — Ambulatory Visit: Payer: Self-pay | Admitting: Internal Medicine

## 2013-12-09 ENCOUNTER — Encounter: Payer: Self-pay | Admitting: Internal Medicine

## 2013-12-09 ENCOUNTER — Ambulatory Visit (INDEPENDENT_AMBULATORY_CARE_PROVIDER_SITE_OTHER): Payer: Medicare Other | Admitting: Internal Medicine

## 2013-12-09 VITALS — BP 118/74 | HR 64 | Temp 97.7°F | Resp 16 | Ht 67.75 in | Wt 215.0 lb

## 2013-12-09 DIAGNOSIS — I10 Essential (primary) hypertension: Secondary | ICD-10-CM | POA: Insufficient documentation

## 2013-12-09 DIAGNOSIS — E785 Hyperlipidemia, unspecified: Secondary | ICD-10-CM

## 2013-12-09 DIAGNOSIS — E119 Type 2 diabetes mellitus without complications: Secondary | ICD-10-CM

## 2013-12-09 DIAGNOSIS — E559 Vitamin D deficiency, unspecified: Secondary | ICD-10-CM

## 2013-12-09 DIAGNOSIS — Z79899 Other long term (current) drug therapy: Secondary | ICD-10-CM

## 2013-12-09 DIAGNOSIS — D509 Iron deficiency anemia, unspecified: Secondary | ICD-10-CM

## 2013-12-09 LAB — LIPID PANEL
Cholesterol: 116 mg/dL (ref 0–200)
HDL: 29 mg/dL — ABNORMAL LOW (ref >39)
LDL Cholesterol: 64 mg/dL (ref 0–99)
Total CHOL/HDL Ratio: 4 Ratio
Triglycerides: 116 mg/dL (ref <150)
VLDL: 23 mg/dL (ref 0–40)

## 2013-12-09 LAB — CBC WITH DIFFERENTIAL/PLATELET
Basophils Absolute: 0.1 10*3/uL (ref 0.0–0.1)
Basophils Relative: 1 % (ref 0–1)
Eosinophils Absolute: 0.3 10*3/uL (ref 0.0–0.7)
Eosinophils Relative: 4 % (ref 0–5)
HCT: 40 % (ref 39.0–52.0)
Hemoglobin: 13.1 g/dL (ref 13.0–17.0)
Lymphocytes Relative: 49 % — ABNORMAL HIGH (ref 12–46)
Lymphs Abs: 3.8 10*3/uL (ref 0.7–4.0)
MCH: 22.6 pg — ABNORMAL LOW (ref 26.0–34.0)
MCHC: 32.8 g/dL (ref 30.0–36.0)
MCV: 69 fL — ABNORMAL LOW (ref 78.0–100.0)
Monocytes Absolute: 0.5 10*3/uL (ref 0.1–1.0)
Monocytes Relative: 6 % (ref 3–12)
Neutro Abs: 3.1 10*3/uL (ref 1.7–7.7)
Neutrophils Relative %: 40 % — ABNORMAL LOW (ref 43–77)
Platelets: 306 10*3/uL (ref 150–400)
RBC: 5.8 MIL/uL (ref 4.22–5.81)
RDW: 16.8 % — ABNORMAL HIGH (ref 11.5–15.5)
WBC: 7.8 10*3/uL (ref 4.0–10.5)

## 2013-12-09 LAB — BASIC METABOLIC PANEL WITH GFR
BUN: 20 mg/dL (ref 6–23)
CO2: 27 mEq/L (ref 19–32)
Calcium: 9.6 mg/dL (ref 8.4–10.5)
Chloride: 102 mEq/L (ref 96–112)
Creat: 0.98 mg/dL (ref 0.50–1.35)
GFR, Est African American: >89 mL/min
GFR, Est Non African American: 79 mL/min
Glucose, Bld: 108 mg/dL — ABNORMAL HIGH (ref 70–99)
Potassium: 4.6 mEq/L (ref 3.5–5.3)
Sodium: 137 mEq/L (ref 135–145)

## 2013-12-09 LAB — HEPATIC FUNCTION PANEL
ALT: 20 U/L (ref 0–53)
AST: 17 U/L (ref 0–37)
Albumin: 4.4 g/dL (ref 3.5–5.2)
Alkaline Phosphatase: 61 U/L (ref 39–117)
Bilirubin, Direct: 0.1 mg/dL (ref 0.0–0.3)
Indirect Bilirubin: 0.5 mg/dL (ref 0.2–1.2)
Total Bilirubin: 0.6 mg/dL (ref 0.2–1.2)
Total Protein: 7.5 g/dL (ref 6.0–8.3)

## 2013-12-09 LAB — HEMOGLOBIN A1C
Hgb A1c MFr Bld: 8.5 % — ABNORMAL HIGH (ref <5.7)
Mean Plasma Glucose: 197 mg/dL — ABNORMAL HIGH (ref <117)

## 2013-12-09 LAB — MAGNESIUM: Magnesium: 1.9 mg/dL (ref 1.5–2.5)

## 2013-12-09 LAB — TSH: TSH: 0.545 u[IU]/mL (ref 0.350–4.500)

## 2013-12-09 NOTE — Patient Instructions (Signed)

## 2013-12-09 NOTE — Progress Notes (Signed)
Patient ID: Nathan Davis, male   DOB: 1946/03/27, 68 y.o.   MRN: [id number]    This very nice 68 y.o. MBM presents for 3 month follow up with Hypertension, Hyperlipidemia, T1 IDDM and Vitamin D Deficiency.    HTN predates since Jan 2011. BP has been controlled at home. Today's BP: 118/74 mmHg . Patient denies any cardiac type chest pain, palpitations, dyspnea/orthopnea/PND, dizziness, claudication, or dependent edema.   Hyperlipidemia is controlled with diet & meds. Last Cholesterol was 118, Triglycerides were  99, HDL 29 and LDL 69 in Oct 2014 -all at goal. Patient denies myalgias or other med SE's.    Also, the patient has history of  T1 IDDM predating since 1992  with last A1c of  8.7% in Oct 2014 and patient was started on Farxiga Sx's. Patient denies any symptoms of reactive hypoglycemia, diabetic polys, paresthesias or visual blurring.     Further, Patient has history of Vitamin D Deficiency with last vitamin D of 39 in Oct 2014 and he was advised dose increase. Patient supplements vitamin D without any suspected side-effects.  Medication Sig  . aspirin 81 MG tablet Take 81 mg by mouth daily.  Marland Kitchen atenolol 25 MG tablet Take 25 mg by mouth daily.  Marland Kitchen atorvastatin  80 MG tablet Take 80 mg by mouth daily.  Marland Kitchen VITAMIN D 2000 UNITS tablet Take 2,000 Units by mouth daily.  . Dapagliflozin (FARXIGA) 10 MG TABS Take by mouth daily.  . Insulin Pen Needle 31G X 8 MM MISC Check BS twice daily   . LEVEMIR FLEXPEN 100 UNIT/ML SOPN  25 u / da  . loratadine 10 MG tablet Take 10 mg by mouth daily.  . metFORMIN  500 MG tablet XR Take 1,000 mg by mouth 2  times daily        Allergies  Allergen Reactions  . Amaryl [Glimepiride] Diarrhea  . Codeine Itching  . Pioglitazone Diarrhea, Itching and Anxiety    PMHx:   Past Medical History  Diagnosis Date  . Premature beats, unspecified   . Unspecified essential hypertension   . Other and unspecified hyperlipidemia   . Diabetes 1992  . Iron  (Fe) deficiency anemia   . Obesity   . Cataract   . Vitamin D deficiency   . Colon polyps   . Femur fracture     left  . ED (erectile dysfunction)     FHx:    Reviewed / unchanged  SHx:    Reviewed / unchanged   Systems Review: Constitutional: Denies fever, chills, wt changes, headaches, insomnia, fatigue, night sweats, change in appetite. Eyes: Denies redness, blurred vision, diplopia, discharge, itchy, watery eyes.  ENT: Denies discharge, congestion, post nasal drip, epistaxis, sore throat, earache, hearing loss, dental pain, tinnitus, vertigo, sinus pain, snoring.  CV: Denies chest pain, palpitations, irregular heartbeat, syncope, dyspnea, diaphoresis, orthopnea, PND, claudication, edema. Respiratory: denies cough, dyspnea, DOE, pleurisy, hoarseness, laryngitis, wheezing.  Gastrointestinal: Denies dysphagia, odynophagia, heartburn, reflux, water brash, abdominal pain or cramps, nausea, vomiting, bloating, diarrhea, constipation, hematemesis, melena, hematochezia,  or hemorrhoids. Genitourinary: Denies dysuria, frequency, urgency, nocturia, hesitancy, discharge, hematuria, flank pain. Musculoskeletal: Denies arthralgias, myalgias, stiffness, jt. swelling, pain, limp, strain/sprain.  Skin: Denies pruritus, rash, hives, warts, acne, eczema, change in skin lesion(s). Neuro: No weakness, tremor, incoordination, spasms, paresthesia, or pain. Psychiatric: Denies confusion, memory loss, or sensory loss. Endo: Denies change in weight, skin, hair change.  Heme/Lymph: No excessive bleeding, bruising, orenlarged lymph nodes.  Exam:  BP 118/74  Pulse 64  Temp(Src) 97.7 F (36.5 C) (Temporal)  Resp 16  Ht 5' 7.75" (1.721 m)  Wt 215 lb (97.523 kg)  BMI 32.93 kg/m2  Appears well nourished - in no distress. Eyes: PERRLA, EOMs, conjunctiva no swelling or erythema. Sinuses: No frontal/maxillary tenderness ENT/Mouth: EAC's clear, TM's nl w/o erythema, bulging. Nares clear w/o erythema,  swelling, exudates. Oropharynx clear without erythema or exudates. Oral hygiene is good. Tongue normal, non obstructing. Hearing intact.  Neck: Supple. Thyroid nl. Car 2+/2+ without bruits, nodes or JVD. Chest: Respirations nl with BS clear & equal w/o rales, rhonchi, wheezing or stridor.  Cor: Heart sounds normal w/ regular rate and rhythm without sig. murmurs, gallops, clicks, or rubs. Peripheral pulses normal and equal  without edema.  Abdomen: Soft & bowel sounds normal. Non-tender w/o guarding, rebound, hernias, masses, or organomegaly.  Lymphatics: Unremarkable.  Musculoskeletal: Full ROM all peripheral extremities, joint stability, 5/5 strength, and normal gait.  Skin: Warm, dry without exposed rashes, lesions, ecchymosis apparent.  Neuro: Cranial nerves intact, reflexes equal bilaterally. Sensory-motor testing grossly intact. Tendon reflexes grossly intact.  Pysch: Alert & oriented x 3. Insight and judgement nl & appropriate. No ideations.  Assessment and Plan:  1. Hypertension - Continue monitor blood pressure at home. Continue diet/meds same.  2. Hyperlipidemia - Continue diet/meds, exercise,& lifestyle modifications. Continue monitor periodic cholesterol/liver & renal functions   3. T1 IDDM - continue recommend prudent low glycemic diet, weight control, regular exercise, diabetic monitoring and periodic eye exams.  4. Vitamin D Deficiency - Continue supplementation.  Recommended regular exercise, BP monitoring, weight control, and discussed med and SE's. Recommended labs to assess and monitor clinical status. Further disposition pending results of labs.

## 2013-12-10 LAB — VITAMIN D 25 HYDROXY (VIT D DEFICIENCY, FRACTURES): Vit D, 25-Hydroxy: 42 ng/mL (ref 30–89)

## 2014-01-27 ENCOUNTER — Encounter: Payer: Self-pay | Admitting: *Deleted

## 2014-02-03 ENCOUNTER — Ambulatory Visit (INDEPENDENT_AMBULATORY_CARE_PROVIDER_SITE_OTHER): Payer: Medicare Other | Admitting: Cardiology

## 2014-02-03 VITALS — BP 132/68 | HR 69 | Ht 67.5 in | Wt 211.0 lb

## 2014-02-03 DIAGNOSIS — E785 Hyperlipidemia, unspecified: Secondary | ICD-10-CM

## 2014-02-03 DIAGNOSIS — I493 Ventricular premature depolarization: Secondary | ICD-10-CM

## 2014-02-03 DIAGNOSIS — D509 Iron deficiency anemia, unspecified: Secondary | ICD-10-CM

## 2014-02-03 DIAGNOSIS — I4949 Other premature depolarization: Secondary | ICD-10-CM

## 2014-02-03 DIAGNOSIS — I10 Essential (primary) hypertension: Secondary | ICD-10-CM

## 2014-02-03 NOTE — Patient Instructions (Signed)
Your physician wants you to follow-up in: 1 year with Dr McLean. (May 2016). You will receive a reminder letter in the mail two months in advance. If you don't receive a letter, please call our office to schedule the follow-up appointment.  

## 2014-02-04 ENCOUNTER — Encounter: Payer: Self-pay | Admitting: Cardiology

## 2014-02-04 DIAGNOSIS — I493 Ventricular premature depolarization: Secondary | ICD-10-CM | POA: Insufficient documentation

## 2014-02-04 NOTE — Progress Notes (Signed)
Patient ID: Nathan Davis, male   DOB: June 06, 1946, 68 y.o.   MRN: [id number] PCP: Dr. Melford Aase  68 yo with history of diabetes, HTN, PVCs, and hyperlipidemia presents for cardiology followup.  He had a normal myoview in 2011.  He has done well symptomatically recently.  He has had only rare palpitations on atenolol.  No chest pain.  Occasional GERD.  No exertional dyspnea.  No lightheadedness.  He is now working up at [employer] part-time.   Labs (11/10): K 4.3, creatinine 1.01, LDL 64, HDL 37, TSH normal  Labs (3/15): K 4.6, creatinine 0.98, LDL 64, HDL 29, TSH normal  ECG: NSR, 1st degree AV block, nonspecific T wave flattening.   Allergies (verified):  1) ! * Actos  2) ! Codeine   Past Medical History:  1. PVCs 2. HTN  3. Diabetes: since 1992  4. Hyperlipidemia  5. Obesity  6. Fe-deficiency anemia  7. ETT-myoview (2011): 7'45", stopped due to fatigue, no chest pain. EF 55%, no ischemia or infarction.   Family History:  HTN  Grandmother with MI in her 31s.   Social History:  Never smoked. Married with children. Works for [employer] [place] at the [employer].   Current Outpatient Prescriptions  Medication Sig Dispense Refill  . aspirin 81 MG tablet Take 81 mg by mouth daily.      Marland Kitchen atenolol (TENORMIN) 25 MG tablet Take 25 mg by mouth daily.      Marland Kitchen atorvastatin (LIPITOR) 80 MG tablet Take 40-80 mg by mouth daily. 1/2 TAB Tuesday, Thursday, SATURDAY      . Cholecalciferol (VITAMIN D) 2000 UNITS tablet Take 2,000 Units by mouth daily.      . Dapagliflozin Propanediol (FARXIGA) 10 MG TABS Take by mouth daily.      . Insulin Pen Needle 31G X 8 MM MISC Check BS twice daily for fluctuations and medication adjustment  200 each  3  . LEVEMIR FLEXPEN 100 UNIT/ML SOPN INJECT 40-50 UNITS SUBCUTANEOUSLY DAILY AS DIRECTED  5 pen  5  . loratadine (CLARITIN) 10 MG tablet Take 10 mg by mouth daily.      . metFORMIN (GLUCOPHAGE) 500 MG tablet Take 1,000 mg by mouth 2 (two) times  daily with a meal.        No current facility-administered medications for this visit.    BP 132/68  Pulse 69  Ht 5' 7.5" (1.715 m)  Wt 95.709 kg (211 lb)  BMI 32.54 kg/m2 General: NAD Neck: No JVD, no thyromegaly or thyroid nodule.  Lungs: Clear to auscultation bilaterally with normal respiratory effort. CV: Nondisplaced PMI.  Heart regular S1/S2, no S3/S4, no murmur.  No peripheral edema.  No carotid bruit.  Normal pedal pulses.  Abdomen: Soft, nontender, no hepatosplenomegaly Neurologic: Alert and oriented x 3.  Psych: Normal affect. Extremities: No clubbing or cyanosis.   Assessment/Plan: 1. PVCs: Minimal palpitations.  He is on atenolol. 2. HTN: BP is well-controlled.  3. Hyperlipidemia: Excellent LDL in 3/15.  Followup in 1 year.   Larey Dresser 02/04/2014

## 2014-02-15 ENCOUNTER — Other Ambulatory Visit: Payer: Self-pay | Admitting: Emergency Medicine

## 2014-02-15 DIAGNOSIS — R5383 Other fatigue: Secondary | ICD-10-CM

## 2014-02-15 DIAGNOSIS — I10 Essential (primary) hypertension: Secondary | ICD-10-CM

## 2014-02-15 DIAGNOSIS — Z111 Encounter for screening for respiratory tuberculosis: Secondary | ICD-10-CM

## 2014-02-15 DIAGNOSIS — Z125 Encounter for screening for malignant neoplasm of prostate: Secondary | ICD-10-CM

## 2014-02-15 DIAGNOSIS — D649 Anemia, unspecified: Secondary | ICD-10-CM

## 2014-02-15 DIAGNOSIS — Z1212 Encounter for screening for malignant neoplasm of rectum: Secondary | ICD-10-CM

## 2014-02-15 DIAGNOSIS — E538 Deficiency of other specified B group vitamins: Secondary | ICD-10-CM

## 2014-02-15 DIAGNOSIS — E782 Mixed hyperlipidemia: Secondary | ICD-10-CM

## 2014-02-15 DIAGNOSIS — R5381 Other malaise: Secondary | ICD-10-CM

## 2014-02-15 DIAGNOSIS — E1149 Type 2 diabetes mellitus with other diabetic neurological complication: Secondary | ICD-10-CM

## 2014-02-15 DIAGNOSIS — Z0000 Encounter for general adult medical examination without abnormal findings: Secondary | ICD-10-CM

## 2014-02-15 DIAGNOSIS — Z23 Encounter for immunization: Secondary | ICD-10-CM

## 2014-02-15 MED ORDER — DAPAGLIFLOZIN PROPANEDIOL 10 MG PO TABS: ORAL_TABLET | ORAL | Status: DC

## 2014-03-22 ENCOUNTER — Other Ambulatory Visit: Payer: Self-pay | Admitting: Physician Assistant

## 2014-03-31 ENCOUNTER — Encounter: Payer: Self-pay | Admitting: Emergency Medicine

## 2014-03-31 ENCOUNTER — Ambulatory Visit (INDEPENDENT_AMBULATORY_CARE_PROVIDER_SITE_OTHER): Payer: Medicare Other | Admitting: Emergency Medicine

## 2014-03-31 VITALS — BP 106/60 | HR 58 | Temp 98.2°F | Resp 16 | Ht 67.75 in | Wt 211.0 lb

## 2014-03-31 DIAGNOSIS — E1159 Type 2 diabetes mellitus with other circulatory complications: Secondary | ICD-10-CM

## 2014-03-31 DIAGNOSIS — I10 Essential (primary) hypertension: Secondary | ICD-10-CM

## 2014-03-31 DIAGNOSIS — E782 Mixed hyperlipidemia: Secondary | ICD-10-CM

## 2014-03-31 LAB — CBC WITH DIFFERENTIAL/PLATELET
Basophils Absolute: 0.1 10*3/uL (ref 0.0–0.1)
Basophils Relative: 1 % (ref 0–1)
Eosinophils Absolute: 0.4 10*3/uL (ref 0.0–0.7)
Eosinophils Relative: 5 % (ref 0–5)
HCT: 39.9 % (ref 39.0–52.0)
Hemoglobin: 12.9 g/dL — ABNORMAL LOW (ref 13.0–17.0)
Lymphocytes Relative: 65 % — ABNORMAL HIGH (ref 12–46)
Lymphs Abs: 5.1 10*3/uL — ABNORMAL HIGH (ref 0.7–4.0)
MCH: 22.6 pg — ABNORMAL LOW (ref 26.0–34.0)
MCHC: 32.3 g/dL (ref 30.0–36.0)
MCV: 70 fL — ABNORMAL LOW (ref 78.0–100.0)
Monocytes Absolute: 0.5 10*3/uL (ref 0.1–1.0)
Monocytes Relative: 6 % (ref 3–12)
Neutro Abs: 1.8 10*3/uL (ref 1.7–7.7)
Neutrophils Relative %: 23 % — ABNORMAL LOW (ref 43–77)
Platelets: 304 10*3/uL (ref 150–400)
RBC: 5.7 MIL/uL (ref 4.22–5.81)
RDW: 16.7 % — ABNORMAL HIGH (ref 11.5–15.5)
WBC: 7.8 10*3/uL (ref 4.0–10.5)

## 2014-03-31 LAB — LIPID PANEL
Cholesterol: 115 mg/dL (ref 0–200)
HDL: 35 mg/dL — ABNORMAL LOW (ref >39)
LDL Cholesterol: 63 mg/dL (ref 0–99)
Total CHOL/HDL Ratio: 3.3 Ratio
Triglycerides: 83 mg/dL (ref <150)
VLDL: 17 mg/dL (ref 0–40)

## 2014-03-31 LAB — BASIC METABOLIC PANEL WITHOUT GFR
BUN: 21 mg/dL (ref 6–23)
CO2: 30 meq/L (ref 19–32)
Calcium: 9.3 mg/dL (ref 8.4–10.5)
Chloride: 101 meq/L (ref 96–112)
Creat: 1.09 mg/dL (ref 0.50–1.35)
GFR, Est African American: 80 mL/min
GFR, Est Non African American: 69 mL/min
Glucose, Bld: 95 mg/dL (ref 70–99)
Potassium: 4.8 meq/L (ref 3.5–5.3)
Sodium: 137 meq/L (ref 135–145)

## 2014-03-31 LAB — HEPATIC FUNCTION PANEL
ALT: 14 U/L (ref 0–53)
AST: 17 U/L (ref 0–37)
Albumin: 4.2 g/dL (ref 3.5–5.2)
Alkaline Phosphatase: 59 U/L (ref 39–117)
Bilirubin, Direct: 0.2 mg/dL (ref 0.0–0.3)
Indirect Bilirubin: 0.6 mg/dL (ref 0.2–1.2)
Total Bilirubin: 0.8 mg/dL (ref 0.2–1.2)
Total Protein: 7.1 g/dL (ref 6.0–8.3)

## 2014-03-31 LAB — HEMOGLOBIN A1C
Hgb A1c MFr Bld: 7.8 % — ABNORMAL HIGH (ref <5.7)
Mean Plasma Glucose: 177 mg/dL — ABNORMAL HIGH (ref <117)

## 2014-03-31 MED ORDER — ATORVASTATIN CALCIUM 80 MG PO TABS: 40.0000 mg | ORAL_TABLET | Freq: Every day | ORAL | Status: DC

## 2014-03-31 NOTE — Patient Instructions (Signed)
Hypotension Decrease Atenolol to 1/2 tablet with low BP, check BP and call if it is above 130/80.Try to decrease insulin by 2 units every 2 days that blood sugar is below 120   As your heart beats, it forces blood through your body. This force is called blood pressure. If you have hypotension, you have low blood pressure. When your blood pressure is too low, you may not get enough blood to your brain. You may feel weak, feel lightheaded, have a fast heartbeat, or even pass out (faint). HOME CARE  Drink enough fluids to keep your pee (urine) clear or pale yellow.  Take all medicines as told by your doctor.  Get up slowly after sitting or lying down.  Wear support stockings as told by your doctor.  Maintain a healthy diet by including foods such as fruits, vegetables, nuts, whole grains, and lean meats. GET HELP IF:  You are throwing up (vomiting) or have watery poop (diarrhea).  You have a fever for more than 2-3 days.  You feel more thirsty than usual.  You feel weak and tired. GET HELP RIGHT AWAY IF:   You pass out (faint).  You have chest pain or a fast or irregular heartbeat.  You lose feeling in part of your body.  You cannot move your arms or legs.  You have trouble speaking.  You get sweaty or feel lightheaded. MAKE SURE YOU:   Understand these instructions.  Will watch your condition.  Will get help right away if you are not doing well or get worse. Document Released: 11/28/2009 Document Revised: 05/06/2013 Document Reviewed: 03/06/2013 [facility] Patient Information 2015 [place], [place]. This information is not intended to replace advice given to you by your health care provider. Make sure you discuss any questions you have with your health care provider.

## 2014-03-31 NOTE — Progress Notes (Signed)
Subjective:    Patient ID: Nathan Davis, male    DOB: 31-May-1946, 68 y.o.   MRN: [id number]  HPI Comments: 68 yo AAM presents for 3 month F/U for HTN, Cholesterol, DM, D. Deficient. He notes BS up and down. He checks feet routinely and denies skin break down or neuropathy increase. He is exercising routinely. He is eating healthy for the most part. He is trying to lose weight. He is using 25 units of insulin daily. He denies any low BP issues.   WBC             7.8   12/09/2013 HGB            13.1   12/09/2013 HCT            40.0   12/09/2013 PLT             306   12/09/2013 GLUCOSE         108   12/09/2013 CHOL            116   12/09/2013 TRIG            116   12/09/2013 HDL              29   12/09/2013 LDLCALC          64   12/09/2013 ALT              20   12/09/2013 AST              17   12/09/2013 NA              137   12/09/2013 K               4.6   12/09/2013 CL              102   12/09/2013 CREATININE     0.98   12/09/2013 BUN              20   12/09/2013 CO2              27   12/09/2013 TSH           0.545   12/09/2013 PSA            0.72   08/27/2013 HGBA1C          8.5   12/09/2013 MICROALBUR     0.50   08/27/2013   Diabetes  Hypertension  Hyperlipidemia     Medication List       This list is accurate as of: 03/31/14  8:54 AM.  Always use your most recent med list.               aspirin 81 MG tablet  Take 81 mg by mouth daily.     atenolol 25 MG tablet  Commonly known as:  TENORMIN  Take 25 mg by mouth daily.     atorvastatin 80 MG tablet  Commonly known as:  LIPITOR  Take 40-80 mg by mouth daily. 1/2 TAB Tuesday, Thursday, SATURDAY     Dapagliflozin Propanediol 10 MG Tabs  Commonly known as:  FARXIGA  1 qd     Insulin Pen Needle 31G X 8 MM Misc  Check BS twice daily for fluctuations and medication adjustment     LEVEMIR FLEXPEN 100 UNIT/ML Pen  Generic drug:  Insulin Detemir  INJECT 40-50 UNITS SUBCUTANEOUSLY DAILY AS DIRECTED  loratadine 10 MG  tablet  Commonly known as:  CLARITIN  Take 10 mg by mouth daily.     metFORMIN 500 MG tablet  Commonly known as:  GLUCOPHAGE  Take 1,000 mg by mouth 2 (two) times daily with a meal.     Vitamin D 2000 UNITS tablet  Take 2,000 Units by mouth daily.       Allergies  Allergen Reactions  . Amaryl [Glimepiride] Diarrhea  . Codeine Itching  . Pioglitazone Diarrhea, Itching and Anxiety   Past Medical History  Diagnosis Date  . Premature beats, unspecified   . Unspecified essential hypertension   . Other and unspecified hyperlipidemia   . Diabetes 1992  . Iron (Fe) deficiency anemia   . Obesity   . Cataract   . Vitamin D deficiency   . Colon polyps   . Femur fracture     left  . ED (erectile dysfunction)       Review of Systems  All other systems reviewed and are negative.  BP 106/60  Pulse 58  Temp(Src) 98.2 F (36.8 C) (Temporal)  Resp 16  Ht 5' 7.75" (1.721 m)  Wt 211 lb (95.709 kg)  BMI 32.31 kg/m2     Objective:   Physical Exam  Nursing note and vitals reviewed. Constitutional: He is oriented to person, place, and time. He appears well-developed and well-nourished.  HENT:  Head: Normocephalic and atraumatic.  Right Ear: External ear normal.  Left Ear: External ear normal.  Nose: Nose normal.  Eyes: Conjunctivae and EOM are normal.  Neck: Normal range of motion. Neck supple. No JVD present. No thyromegaly present.  Cardiovascular: Normal rate, regular rhythm, normal heart sounds and intact distal pulses.   Pulmonary/Chest: Effort normal and breath sounds normal.  Abdominal: Soft. Bowel sounds are normal. He exhibits no distension. There is no tenderness.  Musculoskeletal: Normal range of motion. He exhibits no edema and no tenderness.  Lymphadenopathy:    He has no cervical adenopathy.  Neurological: He is alert and oriented to person, place, and time. He has normal reflexes. No cranial nerve deficit. Coordination normal.  Skin: Skin is warm and dry.   Psychiatric: He has a normal mood and affect. His behavior is normal. Judgment and thought content normal.          Assessment & Plan:  1.  3 month F/U for HTN, Cholesterol,DM, D. Deficient. Needs healthy diet, cardio QD and obtain healthy weight. Check Labs, Check BP if >130/80 call office, Check BS if >200 call office. Decrease Atenolol to 1/2 tablet with low BP, check BP and call if it is above 130/80. Try to decrease insulin by 2 units every 2 days that blood sugar is below 120

## 2014-05-25 ENCOUNTER — Other Ambulatory Visit: Payer: Self-pay | Admitting: Physician Assistant

## 2014-06-30 ENCOUNTER — Other Ambulatory Visit: Payer: Self-pay | Admitting: Physician Assistant

## 2014-07-05 ENCOUNTER — Other Ambulatory Visit: Payer: Self-pay | Admitting: Emergency Medicine

## 2014-07-07 ENCOUNTER — Ambulatory Visit: Payer: Self-pay | Admitting: Physician Assistant

## 2014-08-02 ENCOUNTER — Other Ambulatory Visit: Payer: Self-pay | Admitting: Physician Assistant

## 2014-08-09 ENCOUNTER — Ambulatory Visit: Payer: Self-pay | Admitting: Physician Assistant

## 2014-08-11 ENCOUNTER — Encounter: Payer: Self-pay | Admitting: Physician Assistant

## 2014-08-11 ENCOUNTER — Ambulatory Visit (INDEPENDENT_AMBULATORY_CARE_PROVIDER_SITE_OTHER): Payer: Medicare Other | Admitting: Physician Assistant

## 2014-08-11 VITALS — BP 110/60 | HR 56 | Temp 98.1°F | Resp 16 | Ht 67.75 in | Wt 214.0 lb

## 2014-08-11 DIAGNOSIS — N182 Chronic kidney disease, stage 2 (mild): Secondary | ICD-10-CM

## 2014-08-11 DIAGNOSIS — I10 Essential (primary) hypertension: Secondary | ICD-10-CM

## 2014-08-11 DIAGNOSIS — E1169 Type 2 diabetes mellitus with other specified complication: Secondary | ICD-10-CM

## 2014-08-11 DIAGNOSIS — E1122 Type 2 diabetes mellitus with diabetic chronic kidney disease: Secondary | ICD-10-CM

## 2014-08-11 DIAGNOSIS — E785 Hyperlipidemia, unspecified: Secondary | ICD-10-CM

## 2014-08-11 DIAGNOSIS — E559 Vitamin D deficiency, unspecified: Secondary | ICD-10-CM

## 2014-08-11 DIAGNOSIS — Z79899 Other long term (current) drug therapy: Secondary | ICD-10-CM

## 2014-08-11 LAB — BASIC METABOLIC PANEL WITH GFR
BUN: 18 mg/dL (ref 6–23)
CO2: 28 mEq/L (ref 19–32)
Calcium: 9.5 mg/dL (ref 8.4–10.5)
Chloride: 102 mEq/L (ref 96–112)
Creat: 1.01 mg/dL (ref 0.50–1.35)
GFR, Est African American: 88 mL/min
GFR, Est Non African American: 76 mL/min
Glucose, Bld: 96 mg/dL (ref 70–99)
Potassium: 4.7 mEq/L (ref 3.5–5.3)
Sodium: 138 mEq/L (ref 135–145)

## 2014-08-11 LAB — TSH: TSH: 0.612 u[IU]/mL (ref 0.350–4.500)

## 2014-08-11 LAB — LIPID PANEL
Cholesterol: 129 mg/dL (ref 0–200)
HDL: 33 mg/dL — ABNORMAL LOW (ref >39)
LDL Cholesterol: 78 mg/dL (ref 0–99)
Total CHOL/HDL Ratio: 3.9 Ratio
Triglycerides: 91 mg/dL (ref <150)
VLDL: 18 mg/dL (ref 0–40)

## 2014-08-11 LAB — HEPATIC FUNCTION PANEL
ALT: 21 U/L (ref 0–53)
AST: 20 U/L (ref 0–37)
Albumin: 4.3 g/dL (ref 3.5–5.2)
Alkaline Phosphatase: 69 U/L (ref 39–117)
Bilirubin, Direct: 0.2 mg/dL (ref 0.0–0.3)
Indirect Bilirubin: 0.6 mg/dL (ref 0.2–1.2)
Total Bilirubin: 0.8 mg/dL (ref 0.2–1.2)
Total Protein: 7.4 g/dL (ref 6.0–8.3)

## 2014-08-11 LAB — MAGNESIUM: Magnesium: 1.9 mg/dL (ref 1.5–2.5)

## 2014-08-11 NOTE — Patient Instructions (Signed)
ACE inhibitors are blood pressure medications that protect your heart and kidneys. It can cause two symptoms: The most common symptom is a dry cough/tickle in your throat that can happen the first day you take it or 5 years after you have been taking it. Please call us if you have this and we can switch it to a different medications. The least common side effect is called angioedema which is swelling of your lips and tongue and can cause problems with your breathing. This is a very very rare side effect but very serious. If this happens please stop the medication and go to the ER.   Recommendations For Diabetic/Prediabetic Patients:   -  Take medications as prescribed  -  Recommend Dr Fara Olden Fuhrman's book "The End of Diabetes "  And "The End of Dieting"- Can get at  www.[employer].com and encourage also get the Audio CD book  - AVOID Animal products, ie. Meat - red/white, Poultry and Dairy/especially cheese - Exercise at least 5 times a week for 30 minutes or preferably daily.  - No Smoking - Drink less than 2 drinks a day.  - Monitor your feet for sores - Have yearly Eye Exams - Recommend annual Flu vaccine  - Recommend Pneumovax and Prevnar vaccines - Shingles Vaccine (Zostavax) if over 66 y.o.  Goals:   - BMI less than 24 - Fasting sugar less than 130 or less than 150 if tapering medicines to lose weight  - Systolic BP less than 511  - Diastolic BP less than 80 - Bad LDL Cholesterol less than 70 - Triglycerides less than 150    Bad carbs also include fruit juice, alcohol, and sweet tea. These are empty calories that do not signal to your brain that you are full.   Please remember the good carbs are still carbs which convert into sugar. So please measure them out no more than 1/2-1 cup of rice, oatmeal, pasta, and beans  Veggies are however free foods! Pile them on.   Not all fruit is created equal. Please see the list below, the fruit at the bottom is higher in sugars than the fruit  at the top. Please avoid all dried fruits.

## 2014-08-11 NOTE — Progress Notes (Signed)
Assessment and Plan:  Questionable Hypertension: Continue medication, monitor blood pressure at home. Continue DASH diet.  Reminder to go to the ER if any CP, SOB, nausea, dizziness, severe HA, changes vision/speech, left arm numbness and tingling and jaw pain. Cholesterol: Continue diet and exercise. Check cholesterol.  Diabetes with CKD stage II -Continue diet and exercise. Check A1C, discussed ACE/ARB, will wait until after the holidays but think it would be a good idea to try to stop atenolol and switch to ACE/ARB Vitamin D Def- check level and continue medications.  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   Continue diet and meds as discussed. Further disposition pending results of labs. Discussed med's effects and SE's.    HPI 68 y.o. male  presents for 3 month follow up with hypertension, hyperlipidemia, diabetes and vitamin D. His blood pressure has been controlled at home, today their BP is BP: 110/60 mmHg He does workout. He denies chest pain, shortness of breath, dizziness.  He is on cholesterol medication, he is on lipitor 80 mg 1/2 pill 3 days a week and denies myalgias. His cholesterol is at goal. The cholesterol was:  03/31/2014: Cholesterol, Total 115; HDL Cholesterol by NMR 35*; LDL (calc) 63; Triglycerides 83 He has been working on diet and exercise for Diabetes but admits to eating poorly due to homecoming/family reunions etc, in the AM it has been running 120's, he is on levemir 25 units, farxiga and metformin, he is on bASA and denies paresthesia of the feet, polydipsia and polyuria. Last A1C  was: 03/31/2014: Hemoglobin-A1c 7.8* Patient is on Vitamin D supplement. 12/09/2013: Vit D, 25-Hydroxy 42  He is on atenolol for questionable hypertension and PVC's however after reviewing the notes it appears that the PVCs were possibly driven by sudafed. He is very concerned if he has HTN or not, I have discussed in length that with his DM we will want a lower BP  than normal.  Current Medications:  Current Outpatient Prescriptions on File Prior to Visit  Medication Sig Dispense Refill  . ALLERGY RELIEF 10 MG tablet TAKE ONE TABLET BY MOUTH DAILY 90 tablet 4  . aspirin 81 MG tablet Take 81 mg by mouth daily.    Marland Kitchen atenolol (TENORMIN) 25 MG tablet TAKE ONE TABLET BY MOUTH DAILY AT BEDTIME 90 tablet 3  . atorvastatin (LIPITOR) 80 MG tablet Take 0.5-1 tablets (40-80 mg total) by mouth daily. 1/2 TAB Tuesday, Thursday, SATURDAY 30 tablet 1  . atorvastatin (LIPITOR) 80 MG tablet TAKE 0.5-1 TABLETS (40-80 MG TOTAL) BY MOUTH DAILY. 1/2 TAB TUESDAY, THURSDAY, SATURDAY 90 tablet 0  . Cholecalciferol (VITAMIN D) 2000 UNITS tablet Take 2,000 Units by mouth daily.    . Dapagliflozin Propanediol (FARXIGA) 10 MG TABS 1 qd 28 tablet 0  . Insulin Pen Needle 31G X 8 MM MISC Check BS twice daily for fluctuations and medication adjustment 200 each 3  . LEVEMIR FLEXPEN 100 UNIT/ML SOPN INJECT 40-50 UNITS SUBCUTANEOUSLY DAILY AS DIRECTED 5 pen 5  . metFORMIN (GLUCOPHAGE) 1000 MG tablet TAKE ONE TABLET BY MOUTH TWICE DAILY 60 tablet 4   No current facility-administered medications on file prior to visit.   Medical History:  Past Medical History  Diagnosis Date  . Premature beats, unspecified   . Unspecified essential hypertension   . Other and unspecified hyperlipidemia   . Diabetes 1992  . Iron (Fe) deficiency anemia   . Obesity   . Cataract   . Vitamin D deficiency   .  Colon polyps   . Femur fracture     left  . ED (erectile dysfunction)    Allergies:  Allergies  Allergen Reactions  . Amaryl [Glimepiride] Diarrhea  . Codeine Itching  . Pioglitazone Diarrhea, Itching and Anxiety     Review of Systems: [X]  = complains of  [ ]  = denies  General: Fatigue [ ]  Fever [ ]  Chills [ ]  Weakness [ ]   Insomnia [ ]  Eyes: Redness [ ]  Blurred vision [ ]  Diplopia [ ]   ENT: Congestion [ ]  Sinus Pain [ ]  Post Nasal Drip [ ]  Sore Throat [ ]  Earache [ ]   Cardiac: Chest  pain/pressure [ ]  SOB [ ]  Orthopnea [ ]   Palpitations [ ]   Paroxysmal nocturnal dyspnea[ ]  Claudication [ ]  Edema [ ]   Pulmonary: Cough [ ]  Wheezing[ ]   SOB [ ]   Snoring [ ]   GI: Nausea [ ]  Vomiting[ ]  Dysphagia[ ]  Heartburn[ ]  Abdominal pain [ ]  Constipation [ ] ; Diarrhea [ ] ; BRBPR [ ]  Melena[ ]  GU: Hematuria[ ]  Dysuria [ ]  Nocturia[ ]  Urgency [ ]   Hesitancy [ ]  Discharge [ ]  Neuro: Headaches[ ]  Vertigo[ ]  Paresthesias[ ]  Spasm [ ]  Speech changes [ ]  Incoordination [ ]   Ortho: Arthritis [ ]  Joint pain [ ]  Muscle pain [ ]  Joint swelling [ ]  Back Pain [ ]  Skin:  Rash [ ]   Pruritis [ ]  Change in skin lesion [ ]   Psych: Depression[ ]  Anxiety[ ]  Confusion [ ]  Memory loss [ ]   Heme/Lypmh: Bleeding [ ]  Bruising [ ]  Enlarged lymph nodes [ ]   Endocrine: Visual blurring [ ]  Paresthesia [ ]  Polyuria [ ]  Polydypsea [ ]    Heat/cold intolerance [ ]  Hypoglycemia [ ]   Family history- Review and unchanged Social history- Review and unchanged Physical Exam: BP 110/60 mmHg  Pulse 56  Temp(Src) 98.1 F (36.7 C)  Resp 16  Ht 5' 7.75" (1.721 m)  Wt 214 lb (97.07 kg)  BMI 32.77 kg/m2 Wt Readings from Last 3 Encounters:  08/11/14 214 lb (97.07 kg)  03/31/14 211 lb (95.709 kg)  02/03/14 211 lb (95.709 kg)   General Appearance: Well nourished, in no apparent distress. Eyes: PERRLA, EOMs, conjunctiva no swelling or erythema Sinuses: No Frontal/maxillary tenderness ENT/Mouth: Ext aud canals clear, TMs without erythema, bulging. No erythema, swelling, or exudate on post pharynx.  Tonsils not swollen or erythematous. Hearing normal.  Neck: Supple, thyroid normal.  Respiratory: Respiratory effort normal, BS equal bilaterally without rales, rhonchi, wheezing or stridor.  Cardio: RRR with no MRGs. Brisk peripheral pulses without edema.  Abdomen: Soft, + BS.  Non tender, no guarding, rebound, hernias, masses. Lymphatics: Non tender without lymphadenopathy.  Musculoskeletal: Full ROM, 5/5 strength, normal gait.   Skin: Warm, dry without rashes, lesions, ecchymosis.  Neuro: Cranial nerves intact. No cerebellar symptoms. Sensation intact.  Psych: Awake and oriented X 3, normal affect, Insight and Judgment appropriate.    Vicie Mutters, PA-C 11:15 AM [facility]

## 2014-08-12 LAB — CBC WITH DIFFERENTIAL/PLATELET
Basophils Absolute: 0.1 10*3/uL (ref 0.0–0.1)
Basophils Relative: 1 % (ref 0–1)
Eosinophils Absolute: 0.3 10*3/uL (ref 0.0–0.7)
Eosinophils Relative: 4 % (ref 0–5)
HCT: 40.1 % (ref 39.0–52.0)
Hemoglobin: 13 g/dL (ref 13.0–17.0)
Lymphocytes Relative: 63 % — ABNORMAL HIGH (ref 12–46)
Lymphs Abs: 5.4 10*3/uL — ABNORMAL HIGH (ref 0.7–4.0)
MCH: 22.5 pg — ABNORMAL LOW (ref 26.0–34.0)
MCHC: 32.4 g/dL (ref 30.0–36.0)
MCV: 69.5 fL — ABNORMAL LOW (ref 78.0–100.0)
MPV: 9.4 fL (ref 9.4–12.4)
Monocytes Absolute: 0.6 10*3/uL (ref 0.1–1.0)
Monocytes Relative: 7 % (ref 3–12)
Neutro Abs: 2.2 10*3/uL (ref 1.7–7.7)
Neutrophils Relative %: 25 % — ABNORMAL LOW (ref 43–77)
Platelets: 312 10*3/uL (ref 150–400)
RBC: 5.77 MIL/uL (ref 4.22–5.81)
RDW: 16.5 % — ABNORMAL HIGH (ref 11.5–15.5)
WBC: 8.6 10*3/uL (ref 4.0–10.5)

## 2014-08-12 LAB — HEMOGLOBIN A1C
Hgb A1c MFr Bld: 8.4 % — ABNORMAL HIGH (ref <5.7)
Mean Plasma Glucose: 194 mg/dL — ABNORMAL HIGH (ref <117)

## 2014-08-12 LAB — VITAMIN D 25 HYDROXY (VIT D DEFICIENCY, FRACTURES): Vit D, 25-Hydroxy: 34 ng/mL (ref 30–100)

## 2014-08-30 ENCOUNTER — Encounter: Payer: Self-pay | Admitting: Emergency Medicine

## 2014-10-06 ENCOUNTER — Other Ambulatory Visit: Payer: Self-pay | Admitting: Internal Medicine

## 2014-10-07 ENCOUNTER — Encounter: Payer: Self-pay | Admitting: Emergency Medicine

## 2014-10-07 ENCOUNTER — Encounter: Payer: Self-pay | Admitting: Physician Assistant

## 2014-11-11 ENCOUNTER — Other Ambulatory Visit: Payer: Self-pay | Admitting: Internal Medicine

## 2014-12-06 NOTE — Patient Instructions (Signed)

## 2014-12-06 NOTE — Progress Notes (Signed)
Patient ID: Nathan Davis, male   DOB: [birth year], 69 y.o.   MRN: [id number]  [facility] VISIT AND CPE  Assessment:   1. Essential hypertension  - EKG 12-Lead - [place], RETROPERITNL ABD,  LTD - TSH  2. Mixed hyperlipidemia  - Lipid panel  3. 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 Vitamin D deficiency  - Vit D  25 hydroxy (rtn osteoporosis monitoring)  5. Screening for rectal cancer  - POC Hemoccult Bld/Stl (3-Cd Home Screen); Future  6. Prostate cancer screening  - PSA  7. Depression screen   8. At low risk for fall   9. Medication management  - Urine Microscopic - CBC with Differential/Platelet - BASIC METABOLIC PANEL WITH GFR - Hepatic function panel - Magnesium  Plan:   During the course of the visit the patient was educated and counseled about appropriate screening and preventive services including:    Pneumococcal vaccine   Influenza vaccine  Td vaccine  Screening electrocardiogram  Bone densitometry screening  Colorectal cancer screening  Diabetes screening  Glaucoma screening  Nutrition counseling   Advanced directives: requested  Screening recommendations, referrals: Vaccinations: Immunization History  Administered Date(s) Administered  . Influenza, High Dose Seasonal PF 07/22/2013  . Influenza-Unspecified 06/25/2012  . PPD Test 08/27/2013  . Pneumococcal Conjugate-13 08/22/2011  . Td 09/17/2002  . Tdap 08/27/2013  . Zoster 09/17/2005  Hep B vaccine not indicated  Nutrition assessed and recommended  Colonoscopy 10/17/2010 Recommended yearly ophthalmology/optometry visit for glaucoma screening and checkup Recommended yearly dental visit for hygiene and checkup Advanced directives - no - offered forms  Conditions/risks identified: BMI: Discussed weight  loss, diet, and increase physical activity.  Increase physical activity: AHA recommends 150 minutes of physical activity a week.  Medications reviewed Diabetes is not at goal, ACE/ARB therapy: no Urinary Incontinence is not an issue: discussed non pharmacology and pharmacology options.  Fall risk: low- discussed PT, home fall assessment, medications.   Subjective:    Nathan Davis  presents for Medicare Annual Wellness Visit and complete physical.  No prior medicare wellness visit is known. This very nice 69 y.o. MBM presents for 3 month follow up with Hypertension, Hyperlipidemia, Pre-Diabetes and Vitamin D Deficiency.    Patient is treated for HTN since 2011 & BP has been controlled at home. Today's BP: 116/74 mmHg. Patient did have a negative Cardiolite in 2011. Patient has had no complaints of any cardiac type chest pain, palpitations, dyspnea/orthopnea/PND, dizziness, claudication, or dependent edema. Patient has hx/o benign or unifocal VPB's and is followed by Dr Aundra Dubin annually.   Hyperlipidemia is controlled with diet & meds. Patient denies myalgias or other med SE's. Last Lipids were at goal -  Total Chol 129; HDL 33; LDL  78; Trig 91 on 08/11/2014.   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  He is Morbidly Obese (BMI 33.42) and admittedly is not dietary compliant. He reports FBG's usu range 140's -150's.  Last A1c was  8.4% not at goal  on 08/11/2014.   Further, the patient also has history of Vitamin D Deficiency and supplements vitamin D without any suspected side-effects. Last vitamin D was  34 on  08/11/2014.  Names of Other Physician/Practitioners you currently use: 1. [facility] here for primary care 2. Dr Gershon Crane, eye doctor, next Diabetic Eye Exam  scheduled for 12/09/2014 3. No  dentist, has dentures  Patient Care Team: [name], MD as PCP - General (Internal Medicine) [name], MD as Attending Physician (Internal Medicine) Rutherford Guys, MD as Consulting Physician (Ophthalmology) Larey Dresser, MD as Consulting Physician (Cardiology) Inda Castle, MD as Consulting Physician (Gastroenterology)  Medication Review: Medication Sig  . ALLERGY RELIEF 10 MG tablet TAKE ONE TABLET BY MOUTH DAILY  . aspirin 81 MG tablet Take 81 mg by mouth daily.  Marland Kitchen atenolol  25 MG tablet TAKE ONE TABLET BY MOUTH DAILY AT BEDTIME  . atorvastatin 80 MG tablet TAKE 0.5-1 TABLETS (40-80 MG TOTAL) BY MOUTH DAILY. 1/2 TAB TUESDAY, THURSDAY, SATURDAY  . VITAMIN D 2000 UNITS  Take 2,000 Units by mouth daily.  . Dapagliflozin  (FARXIGA) 10 MG  1 qd  . Insulin Pen Needle 31G X 8 MM MISC Check BS twice daily for fluctuations and medication adjustment  . LEVEMIR FLEXPEN 100 UNIT/ML  INJECT 40-50 UNITS SUBCUTANEOUSLY DAILY AS DIRECTED  . metFORMIN  1000 MG tablet TAKE ONE TABLET BY MOUTH TWICE DAILY   Current Problems (verified) Patient Active Problem List   Diagnosis Date Noted  . PVC (premature ventricular contraction) 02/04/2014  . Essential hypertension 12/09/2013  . Vitamin D deficiency 12/09/2013  . Medication management 12/09/2013  . T1_IDDM w/CKD 01/04/2010  . IRON DEFICIENCY 01/04/2010  . Mixed hyperlipidemia 10/19/2009   Screening Tests Health Maintenance  Topic Date Due  . OPHTHALMOLOGY EXAM  09/03/1956  . PNA vac Low Risk Adult (2 of 2 - PPSV23) 08/21/2012  . URINE MICROALBUMIN  08/27/2014  . HEMOGLOBIN A1C  02/09/2015  . INFLUENZA VACCINE  04/18/2015  . FOOT EXAM  12/07/2015  . COLONOSCOPY  06/28/2020  . TETANUS/TDAP  08/28/2023  . ZOSTAVAX  Completed    Immunization History  Administered Date(s) Administered  . Influenza, High Dose Seasonal PF 07/22/2013  . Influenza-Unspecified 06/25/2012  . PPD Test 08/27/2013  . Pneumococcal Conjugate-13  08/22/2011  . Td 09/17/2002  . Tdap 08/27/2013  . Zoster 09/17/2005   Preventative care: Last colonoscopy: 10/17/2010  History reviewed: allergies, current medications, past family history, past medical history, past social history, past surgical history and problem list  Risk Factors: Tobacco History  Substance Use Topics  . Smoking status: Never Smoker   . Smokeless tobacco: Never Used  . Alcohol Use: No   He does not smoke.  Patient is not a former smoker. Are there smokers in your home (other than you)?  No  Alcohol Current alcohol use: none  Caffeine Current caffeine use: coffee 2 cups /day  Exercise Current exercise: cardiovascular workout on exercise equipment, walking and yard work  Nutrition/Diet Current diet: in general, an "unhealthy" diet  Cardiac risk factors: advanced age (older than 33 for men, 70 for women), diabetes mellitus, hypertension, male gender, sedentary lifestyle and smoking/ tobacco exposure.  Depression Screen (Note: if answer to either of the following is "Yes", a more complete depression screening is indicated)   Q1: Over the past two weeks, have you felt down, depressed or hopeless? No  Q2: Over the past two weeks, have you felt little interest or pleasure in doing things? No  Have you lost interest or pleasure in daily life? No  Do you often feel hopeless? No  Do you cry easily over simple  problems? No  Activities of Daily Living In your present state of health, do you have any difficulty performing the following activities?:  Driving? No Managing money?  No Feeding yourself? No Getting from bed to chair? No Climbing a flight of stairs? No Preparing food and eating?: No Bathing or showering? No Getting dressed: No Getting to the toilet? No Using the toilet:No Moving around from place to place: No In the past year have you fallen or had a near fall?:No   Are you sexually active?  Yes  Do you have more than one partner?   No  Vision Difficulties: No  Hearing Difficulties: No Do you often ask people to speak up or repeat themselves? No Do you experience ringing or noises in your ears? No Do you have difficulty understanding soft or whispered voices? No  Cognition  Do you feel that you have a problem with memory?No  Do you often misplace items? No  Do you feel safe at home?  Yes  Advanced directives Does patient have a [place]? No - offered forms Does patient have a Living Will? No - offered forms  Past Medical History  Diagnosis Date  . Premature beats, unspecified   . Unspecified essential hypertension   . Other and unspecified hyperlipidemia   . Diabetes 1992  . Iron (Fe) deficiency anemia   . Obesity   . Cataract   . Vitamin D deficiency   . Colon polyps   . Femur fracture     left  . ED (erectile dysfunction)    Past Surgical History  Procedure Laterality Date  . Ett - myoview      7'45", stopped due to fatigue, no chest pain. EF55%, no ischemia or infarction  . Hernia repair    . Nissan fundoplication N/A [id number]/61/4431   ROS: Constitutional: Denies fever, chills, weight loss/gain, headaches, insomnia, fatigue, night sweats or change in appetite. Eyes: Denies redness, blurred vision, diplopia, discharge, itchy or watery eyes.  ENT: Denies discharge, congestion, post nasal drip, epistaxis, sore throat, earache, hearing loss, dental pain, Tinnitus, Vertigo, Sinus pain or snoring.  Cardio: Denies chest pain, palpitations, irregular heartbeat, syncope, dyspnea, diaphoresis, orthopnea, PND, claudication or edema Respiratory: denies cough, dyspnea, DOE, pleurisy, hoarseness, laryngitis or wheezing.  Gastrointestinal: Denies dysphagia, heartburn, reflux, water brash, pain, cramps, nausea, vomiting, bloating, diarrhea, constipation, hematemesis, melena, hematochezia, jaundice or hemorrhoids Genitourinary: Denies dysuria, frequency, urgency, nocturia, hesitancy, discharge,  hematuria or flank pain Musculoskeletal: Denies arthralgia, myalgia, stiffness, Jt. Swelling, pain, limp or strain/sprain. Denies Falls. Skin: Denies puritis, rash, hives, warts, acne, eczema or change in skin lesion Neuro: No weakness, tremor, incoordination, spasms, paresthesia or pain Psychiatric: Denies confusion, memory loss or sensory loss. Denies Depression. Endocrine: Denies change in weight, skin, hair change, nocturia, and paresthesia, diabetic polys, visual blurring or hyper / hypo glycemic episodes.  Heme/Lymph: No excessive bleeding, bruising or enlarged lymph nodes.  Objective:     BP 116/74   Pulse 60  Temp 97.3 F   Resp 16  Ht 5' 7.5"   Wt 216 lb 11.2 oz     BMI 33.42   General Appearance:  Alert  WD/WN, male , in no apparent distress. Eyes: PERRLA, EOMs, conjunctiva no swelling or erythema, normal fundi and vessels.No diabetic retinopathy noted.  Sinuses: No frontal/maxillary tenderness ENT/Mouth: EACs patent / TMs  nl. Nares clear without erythema, swelling, mucoid exudates. Oral hygiene is good. No erythema, swelling, or exudate. Tongue normal, non-obstructing. Tonsils not swollen or  erythematous. Hearing normal.  Neck: Supple, thyroid normal. No bruits, nodes or JVD. Respiratory: Respiratory effort normal.  BS equal and clear bilateral without rales, rhonci, wheezing or stridor. Cardio: Heart sounds are normal with regular rate and rhythm and no murmurs, rubs or gallops. Peripheral pulses are normal and equal bilaterally without edema. No aortic or femoral bruits. No signs of PVD. Chest: symmetric with normal excursions and percussion.  Abdomen: Flat, soft, with nl bowel sounds. Nontender, no guarding, rebound, hernias, masses, or organomegaly.  Lymphatics: Non tender without lymphadenopathy.  Genitourinary: No hernias.Testes nl. DRE - prostate nl for age - smooth & firm w/o nodules. Musculoskeletal: Full ROM all peripheral extremities, joint stability, 5/5 strength,  and normal gait. Skin: Warm and dry without rashes, lesions, cyanosis, clubbing or  ecchymosis.  Neuro: Cranial nerves intact, reflexes equal bilaterally. Normal muscle tone, no cerebellar symptoms. Sensation intact.  Pysch: Awake and oriented X 3 with normal affect, insight and judgment appropriate.   Cognitive Testing  Alert? Yes  Normal Appearance? Yes  Oriented to person? Yes  Place? Yes   Time? Yes  Recall of three objects?  Yes  Can perform simple calculations? Yes  Displays appropriate judgment? Yes  Can read the correct time from a watch/clock? Yes  Medicare Attestation I have personally reviewed: The patient's medical and social history Their use of alcohol, tobacco or illicit drugs Their current medications and supplements The patient's functional ability including ADLs,fall risks, home safety risks, cognitive, and hearing and visual impairment Diet and physical activities - encouraged continue daily exercises Evidence for depression or mood disorders  The patient's weight, height, BMI, and visual acuity have been recorded in the chart.  I have made referrals, counseling, and provided education to the patient based on review of the above and I have provided the patient with a written personalized care plan for preventive services.  Over 40 minutes of exam, counseling, chart review and critical decision making was performed.   Branna Cortina DAVID, MD   12/07/2014

## 2014-12-07 ENCOUNTER — Encounter: Payer: Self-pay | Admitting: Internal Medicine

## 2014-12-07 ENCOUNTER — Ambulatory Visit (INDEPENDENT_AMBULATORY_CARE_PROVIDER_SITE_OTHER): Payer: Medicare Other | Admitting: Internal Medicine

## 2014-12-07 VITALS — BP 116/74 | HR 60 | Temp 97.3°F | Resp 16 | Ht 67.5 in | Wt 216.7 lb

## 2014-12-07 DIAGNOSIS — Z1212 Encounter for screening for malignant neoplasm of rectum: Secondary | ICD-10-CM

## 2014-12-07 DIAGNOSIS — Z9181 History of falling: Secondary | ICD-10-CM

## 2014-12-07 DIAGNOSIS — Z79899 Other long term (current) drug therapy: Secondary | ICD-10-CM

## 2014-12-07 DIAGNOSIS — Z1331 Encounter for screening for depression: Secondary | ICD-10-CM

## 2014-12-07 DIAGNOSIS — Z125 Encounter for screening for malignant neoplasm of prostate: Secondary | ICD-10-CM

## 2014-12-07 DIAGNOSIS — E1029 Type 1 diabetes mellitus with other diabetic kidney complication: Secondary | ICD-10-CM

## 2014-12-07 DIAGNOSIS — E559 Vitamin D deficiency, unspecified: Secondary | ICD-10-CM

## 2014-12-07 DIAGNOSIS — E782 Mixed hyperlipidemia: Secondary | ICD-10-CM

## 2014-12-07 DIAGNOSIS — I10 Essential (primary) hypertension: Secondary | ICD-10-CM

## 2014-12-07 LAB — LIPID PANEL
Cholesterol: 124 mg/dL (ref 0–200)
HDL: 30 mg/dL — ABNORMAL LOW (ref >=40)
LDL Cholesterol: 76 mg/dL (ref 0–99)
Total CHOL/HDL Ratio: 4.1 Ratio
Triglycerides: 88 mg/dL (ref <150)
VLDL: 18 mg/dL (ref 0–40)

## 2014-12-07 LAB — HEPATIC FUNCTION PANEL
ALT: 20 U/L (ref 0–53)
AST: 20 U/L (ref 0–37)
Albumin: 4.4 g/dL (ref 3.5–5.2)
Alkaline Phosphatase: 61 U/L (ref 39–117)
Bilirubin, Direct: 0.2 mg/dL (ref 0.0–0.3)
Indirect Bilirubin: 0.6 mg/dL (ref 0.2–1.2)
Total Bilirubin: 0.8 mg/dL (ref 0.2–1.2)
Total Protein: 7.4 g/dL (ref 6.0–8.3)

## 2014-12-07 LAB — MAGNESIUM: Magnesium: 1.9 mg/dL (ref 1.5–2.5)

## 2014-12-07 LAB — BASIC METABOLIC PANEL WITH GFR
BUN: 21 mg/dL (ref 6–23)
CO2: 27 mEq/L (ref 19–32)
Calcium: 9.7 mg/dL (ref 8.4–10.5)
Chloride: 101 mEq/L (ref 96–112)
Creat: 0.98 mg/dL (ref 0.50–1.35)
GFR, Est African American: >89 mL/min
GFR, Est Non African American: 78 mL/min
Glucose, Bld: 133 mg/dL — ABNORMAL HIGH (ref 70–99)
Potassium: 5 mEq/L (ref 3.5–5.3)
Sodium: 136 mEq/L (ref 135–145)

## 2014-12-08 LAB — CBC WITH DIFFERENTIAL/PLATELET
Basophils Absolute: 0.1 10*3/uL (ref 0.0–0.1)
Basophils Relative: 1 % (ref 0–1)
Eosinophils Absolute: 0.3 10*3/uL (ref 0.0–0.7)
Eosinophils Relative: 3 % (ref 0–5)
HCT: 41.5 % (ref 39.0–52.0)
Hemoglobin: 13.1 g/dL (ref 13.0–17.0)
Lymphocytes Relative: 68 % — ABNORMAL HIGH (ref 12–46)
Lymphs Abs: 6.6 10*3/uL — ABNORMAL HIGH (ref 0.7–4.0)
MCH: 22.8 pg — ABNORMAL LOW (ref 26.0–34.0)
MCHC: 31.6 g/dL (ref 30.0–36.0)
MCV: 72.2 fL — ABNORMAL LOW (ref 78.0–100.0)
MPV: 9.4 fL (ref 8.6–12.4)
Monocytes Absolute: 0.6 10*3/uL (ref 0.1–1.0)
Monocytes Relative: 6 % (ref 3–12)
Neutro Abs: 2.1 10*3/uL (ref 1.7–7.7)
Neutrophils Relative %: 22 % — ABNORMAL LOW (ref 43–77)
Platelets: 295 10*3/uL (ref 150–400)
RBC: 5.75 MIL/uL (ref 4.22–5.81)
RDW: 16.1 % — ABNORMAL HIGH (ref 11.5–15.5)
WBC: 9.7 10*3/uL (ref 4.0–10.5)

## 2014-12-08 LAB — URINALYSIS, MICROSCOPIC ONLY
Bacteria, UA: NONE SEEN
Casts: NONE SEEN
Crystals: NONE SEEN
Squamous Epithelial / HPF: NONE SEEN

## 2014-12-08 LAB — MICROALBUMIN / CREATININE URINE RATIO
Creatinine, Urine: 79.9 mg/dL
Microalb Creat Ratio: 3.8 mg/g (ref 0.0–30.0)
Microalb, Ur: 0.3 mg/dL (ref <2.0)

## 2014-12-08 LAB — PSA: PSA: 0.75 ng/mL (ref <=4.00)

## 2014-12-08 LAB — HEMOGLOBIN A1C
Hgb A1c MFr Bld: 8.7 % — ABNORMAL HIGH (ref <5.7)
Mean Plasma Glucose: 203 mg/dL — ABNORMAL HIGH (ref <117)

## 2014-12-08 LAB — VITAMIN D 25 HYDROXY (VIT D DEFICIENCY, FRACTURES): Vit D, 25-Hydroxy: 32 ng/mL (ref 30–100)

## 2014-12-08 LAB — PATHOLOGIST SMEAR REVIEW

## 2014-12-08 LAB — TSH: TSH: 0.927 u[IU]/mL (ref 0.350–4.500)

## 2014-12-08 LAB — INSULIN, RANDOM: Insulin: 7.5 u[IU]/mL (ref 2.0–19.6)

## 2014-12-26 ENCOUNTER — Encounter: Payer: Self-pay | Admitting: *Deleted

## 2015-01-07 ENCOUNTER — Other Ambulatory Visit: Payer: Self-pay | Admitting: *Deleted

## 2015-01-07 MED ORDER — INSULIN PEN NEEDLE 31G X 8 MM MISC: Status: DC

## 2015-02-09 ENCOUNTER — Ambulatory Visit: Payer: Self-pay | Admitting: Cardiology

## 2015-03-08 ENCOUNTER — Other Ambulatory Visit: Payer: Self-pay

## 2015-03-16 ENCOUNTER — Ambulatory Visit (INDEPENDENT_AMBULATORY_CARE_PROVIDER_SITE_OTHER): Payer: Medicare Other | Admitting: Internal Medicine

## 2015-03-16 ENCOUNTER — Encounter: Payer: Self-pay | Admitting: Internal Medicine

## 2015-03-16 VITALS — BP 118/76 | HR 56 | Temp 98.2°F | Resp 16 | Ht 67.5 in | Wt 213.0 lb

## 2015-03-16 DIAGNOSIS — Z79899 Other long term (current) drug therapy: Secondary | ICD-10-CM

## 2015-03-16 DIAGNOSIS — E559 Vitamin D deficiency, unspecified: Secondary | ICD-10-CM

## 2015-03-16 DIAGNOSIS — E1029 Type 1 diabetes mellitus with other diabetic kidney complication: Secondary | ICD-10-CM

## 2015-03-16 DIAGNOSIS — I10 Essential (primary) hypertension: Secondary | ICD-10-CM

## 2015-03-16 DIAGNOSIS — E782 Mixed hyperlipidemia: Secondary | ICD-10-CM

## 2015-03-16 DIAGNOSIS — E663 Overweight: Secondary | ICD-10-CM

## 2015-03-16 DIAGNOSIS — H6123 Impacted cerumen, bilateral: Secondary | ICD-10-CM

## 2015-03-16 LAB — HEPATIC FUNCTION PANEL
ALT: 17 U/L (ref 0–53)
AST: 19 U/L (ref 0–37)
Albumin: 4.1 g/dL (ref 3.5–5.2)
Alkaline Phosphatase: 55 U/L (ref 39–117)
Bilirubin, Direct: 0.2 mg/dL (ref 0.0–0.3)
Indirect Bilirubin: 0.6 mg/dL (ref 0.2–1.2)
Total Bilirubin: 0.8 mg/dL (ref 0.2–1.2)
Total Protein: 7.3 g/dL (ref 6.0–8.3)

## 2015-03-16 LAB — BASIC METABOLIC PANEL WITH GFR
BUN: 20 mg/dL (ref 6–23)
CO2: 28 mEq/L (ref 19–32)
Calcium: 9.7 mg/dL (ref 8.4–10.5)
Chloride: 102 mEq/L (ref 96–112)
Creat: 0.97 mg/dL (ref 0.50–1.35)
GFR, Est African American: >89 mL/min
GFR, Est Non African American: 79 mL/min
Glucose, Bld: 98 mg/dL (ref 70–99)
Potassium: 4.7 mEq/L (ref 3.5–5.3)
Sodium: 138 mEq/L (ref 135–145)

## 2015-03-16 LAB — LIPID PANEL
Cholesterol: 141 mg/dL (ref 0–200)
HDL: 33 mg/dL — ABNORMAL LOW (ref >=40)
LDL Cholesterol: 85 mg/dL (ref 0–99)
Total CHOL/HDL Ratio: 4.3 Ratio
Triglycerides: 116 mg/dL (ref <150)
VLDL: 23 mg/dL (ref 0–40)

## 2015-03-16 LAB — MAGNESIUM: Magnesium: 1.8 mg/dL (ref 1.5–2.5)

## 2015-03-16 LAB — TSH: TSH: 0.802 u[IU]/mL (ref 0.350–4.500)

## 2015-03-16 NOTE — Progress Notes (Signed)
Patient ID: BRYTEN MAHER, male   DOB: [birth year], 69 y.o.   MRN: [id number]  Assessment and Plan:  Hypertension:  -Continue medication -monitor blood pressure at home. -Continue DASH diet -Reminder to go to the ER if any CP, SOB, nausea, dizziness, severe HA, changes vision/speech, left arm numbness and tingling and jaw pain.  Cholesterol - Continue diet and exercise -Check cholesterol.   Diabetes with diabetic chronic kidney disease -Continue diet and exercise.  -Check A1C  Vitamin D Def -check level -continue medications.   Cerumen impaction -bilateral scooping and flushing   Continue diet and meds as discussed. Further disposition pending results of labs. Discussed med's effects and SE's.    HPI 69 y.o. male  presents for 3 month follow up with hypertension, hyperlipidemia, diabetes and vitamin D deficiency.   His blood pressure has been controlled at home, today their BP is BP: 118/76 mmHg.He does workout.  He walks a lot every day.   He denies chest pain, shortness of breath, dizziness.  He reports that he checks it regularly.     He is on cholesterol medication and denies myalgias. His cholesterol is at goal. The cholesterol was:  12/07/2014: Cholesterol 124; HDL 30*; LDL Cholesterol 76; Triglycerides 88   He has been working on diet and exercise for diabetes with diabetic chronic kidney disease, he is on bASA, he is not on ACE/ARB, and denies  foot ulcerations, hyperglycemia, hypoglycemia , increased appetite, nausea, paresthesia of the feet, polydipsia, polyuria, visual disturbances, vomiting and weight loss. Last A1C was: 12/07/2014: Hgb A1c MFr Bld 8.7*   Patient is on Vitamin D supplement. 12/07/2014: Vit D, 25-Hydroxy 32  He does report that he has been having a lot of graduation parties and feels like he has not been eating as good.    Current Medications:  Current Outpatient Prescriptions on File Prior to Visit  Medication Sig Dispense Refill  . ALLERGY  RELIEF 10 MG tablet TAKE ONE TABLET BY MOUTH DAILY 90 tablet 4  . aspirin 81 MG tablet Take 81 mg by mouth daily.    Marland Kitchen atenolol (TENORMIN) 25 MG tablet TAKE ONE TABLET BY MOUTH DAILY AT BEDTIME 90 tablet 3  . atorvastatin (LIPITOR) 80 MG tablet Take 0.5-1 tablets (40-80 mg total) by mouth daily. 1/2 TAB Tuesday, Thursday, SATURDAY 30 tablet 1  . atorvastatin (LIPITOR) 80 MG tablet TAKE 0.5-1 TABLETS (40-80 MG TOTAL) BY MOUTH DAILY. 1/2 TAB TUESDAY, THURSDAY, SATURDAY 90 tablet 0  . Cholecalciferol (VITAMIN D) 2000 UNITS tablet Take 2,000 Units by mouth daily.    . Dapagliflozin Propanediol (FARXIGA) 10 MG TABS 1 qd 28 tablet 0  . Insulin Pen Needle 31G X 8 MM MISC Check BS twice daily for fluctuations and medication adjustment 200 each 3  . LEVEMIR FLEXPEN 100 UNIT/ML SOPN INJECT 40-50 UNITS SUBCUTANEOUSLY DAILY AS DIRECTED 5 pen 5  . metFORMIN (GLUCOPHAGE) 1000 MG tablet TAKE ONE TABLET BY MOUTH TWICE DAILY 60 tablet 3   No current facility-administered medications on file prior to visit.   Medical History:  Past Medical History  Diagnosis Date  . Premature beats, unspecified   . Unspecified essential hypertension   . Other and unspecified hyperlipidemia   . Diabetes 1992  . Iron (Fe) deficiency anemia   . Obesity   . Cataract   . Vitamin D deficiency   . Colon polyps   . Femur fracture     left  . ED (erectile dysfunction)    Allergies:  Allergies  Allergen Reactions  . Amaryl [Glimepiride] Diarrhea  . Codeine Itching  . Pioglitazone Diarrhea, Itching and Anxiety     Review of Systems:  Review of Systems  Constitutional: Negative for fever, chills and malaise/fatigue.  HENT: Negative for congestion, ear pain and sore throat.   Eyes: Negative.   Respiratory: Negative for cough, shortness of breath and wheezing.   Cardiovascular: Negative for chest pain, palpitations and leg swelling.  Gastrointestinal: Negative for heartburn, diarrhea, constipation, blood in stool and  melena.  Genitourinary: Negative.   Neurological: Negative for dizziness, sensory change, loss of consciousness and headaches.  Psychiatric/Behavioral: Negative for depression. The patient is not nervous/anxious and does not have insomnia.     Family history- Review and unchanged  Social history- Review and unchanged  Physical Exam: BP 118/76 mmHg  Pulse 56  Temp(Src) 98.2 F (36.8 C) (Temporal)  Resp 16  Ht 5' 7.5" (1.715 m)  Wt 213 lb (96.616 kg)  BMI 32.85 kg/m2 Wt Readings from Last 3 Encounters:  03/16/15 213 lb (96.616 kg)  12/07/14 216 lb 11.2 oz (98.294 kg)  08/11/14 214 lb (97.07 kg)   General Appearance: Well nourished well developed, non-toxic appearing, in no apparent distress. Eyes: PERRLA, EOMs, conjunctiva no swelling or erythema ENT/Mouth: Ear canals clear with no erythema, swelling, or discharge.  TMs normal bilaterally, oropharynx clear, moist, with no exudate.   Neck: Supple, thyroid normal, no JVD, no cervical adenopathy.  Respiratory: Respiratory effort normal, breath sounds clear A&P, no wheeze, rhonchi or rales noted.  No retractions, no accessory muscle usage Cardio: RRR with no MRGs. No noted edema.  Abdomen: Soft, + BS.  Non tender, no guarding, rebound, hernias, masses. Musculoskeletal: Full ROM, 5/5 strength, Normal gait Skin: Warm, dry without rashes, lesions, ecchymosis.  Neuro: Awake and oriented X 3, Cranial nerves intact. No cerebellar symptoms.  Psych: normal affect, Insight and Judgment appropriate.    FORCUCCI, Taim Wurm, PA-C 9:10 AM  [facility]

## 2015-03-17 LAB — CBC WITH DIFFERENTIAL/PLATELET
Basophils Absolute: 0.1 10*3/uL (ref 0.0–0.1)
Basophils Relative: 1 % (ref 0–1)
Eosinophils Absolute: 0.3 10*3/uL (ref 0.0–0.7)
Eosinophils Relative: 3 % (ref 0–5)
HCT: 42.4 % (ref 39.0–52.0)
Hemoglobin: 13.3 g/dL (ref 13.0–17.0)
Lymphocytes Relative: 69 % — ABNORMAL HIGH (ref 12–46)
Lymphs Abs: 7.5 10*3/uL — ABNORMAL HIGH (ref 0.7–4.0)
MCH: 22.7 pg — ABNORMAL LOW (ref 26.0–34.0)
MCHC: 31.4 g/dL (ref 30.0–36.0)
MCV: 72.5 fL — ABNORMAL LOW (ref 78.0–100.0)
MPV: 9.1 fL (ref 8.6–12.4)
Monocytes Absolute: 0.7 10*3/uL (ref 0.1–1.0)
Monocytes Relative: 6 % (ref 3–12)
Neutro Abs: 2.3 10*3/uL (ref 1.7–7.7)
Neutrophils Relative %: 21 % — ABNORMAL LOW (ref 43–77)
Platelets: 266 10*3/uL (ref 150–400)
RBC: 5.85 MIL/uL — ABNORMAL HIGH (ref 4.22–5.81)
RDW: 16.3 % — ABNORMAL HIGH (ref 11.5–15.5)
WBC: 10.9 10*3/uL — ABNORMAL HIGH (ref 4.0–10.5)

## 2015-03-17 LAB — VITAMIN D 25 HYDROXY (VIT D DEFICIENCY, FRACTURES): Vit D, 25-Hydroxy: 34 ng/mL (ref 30–100)

## 2015-03-17 LAB — INSULIN, RANDOM: Insulin: 14 u[IU]/mL (ref 2.0–19.6)

## 2015-03-17 LAB — HEMOGLOBIN A1C
Hgb A1c MFr Bld: 8.4 % — ABNORMAL HIGH (ref <5.7)
Mean Plasma Glucose: 194 mg/dL — ABNORMAL HIGH (ref <117)

## 2015-03-17 LAB — PATHOLOGIST SMEAR REVIEW

## 2015-04-04 ENCOUNTER — Other Ambulatory Visit: Payer: Self-pay | Admitting: *Deleted

## 2015-04-04 MED ORDER — METFORMIN HCL 1000 MG PO TABS: 1000.0000 mg | ORAL_TABLET | Freq: Two times a day (BID) | ORAL | Status: DC

## 2015-04-06 ENCOUNTER — Ambulatory Visit: Payer: Self-pay

## 2015-05-06 ENCOUNTER — Ambulatory Visit: Payer: Self-pay | Admitting: Cardiology

## 2015-05-20 ENCOUNTER — Other Ambulatory Visit: Payer: Self-pay | Admitting: Internal Medicine

## 2015-06-29 ENCOUNTER — Ambulatory Visit: Payer: Self-pay | Admitting: Internal Medicine

## 2015-07-06 ENCOUNTER — Ambulatory Visit (INDEPENDENT_AMBULATORY_CARE_PROVIDER_SITE_OTHER): Payer: Medicare Other | Admitting: Physician Assistant

## 2015-07-06 ENCOUNTER — Encounter: Payer: Self-pay | Admitting: Internal Medicine

## 2015-07-06 VITALS — BP 130/70 | HR 60 | Temp 97.7°F | Resp 16 | Ht 67.5 in | Wt 214.0 lb

## 2015-07-06 DIAGNOSIS — E785 Hyperlipidemia, unspecified: Secondary | ICD-10-CM

## 2015-07-06 DIAGNOSIS — E1022 Type 1 diabetes mellitus with diabetic chronic kidney disease: Secondary | ICD-10-CM | POA: Diagnosis not present

## 2015-07-06 DIAGNOSIS — E782 Mixed hyperlipidemia: Secondary | ICD-10-CM | POA: Diagnosis not present

## 2015-07-06 DIAGNOSIS — Z79899 Other long term (current) drug therapy: Secondary | ICD-10-CM | POA: Diagnosis not present

## 2015-07-06 DIAGNOSIS — Z1159 Encounter for screening for other viral diseases: Secondary | ICD-10-CM | POA: Diagnosis not present

## 2015-07-06 DIAGNOSIS — E663 Overweight: Secondary | ICD-10-CM | POA: Diagnosis not present

## 2015-07-06 DIAGNOSIS — N182 Chronic kidney disease, stage 2 (mild): Secondary | ICD-10-CM | POA: Diagnosis not present

## 2015-07-06 DIAGNOSIS — E559 Vitamin D deficiency, unspecified: Secondary | ICD-10-CM | POA: Diagnosis not present

## 2015-07-06 DIAGNOSIS — Z1389 Encounter for screening for other disorder: Secondary | ICD-10-CM

## 2015-07-06 DIAGNOSIS — Z1331 Encounter for screening for depression: Secondary | ICD-10-CM

## 2015-07-06 DIAGNOSIS — I10 Essential (primary) hypertension: Secondary | ICD-10-CM

## 2015-07-06 LAB — BASIC METABOLIC PANEL WITH GFR
BUN: 17 mg/dL (ref 7–25)
CO2: 29 mmol/L (ref 20–31)
Calcium: 9.3 mg/dL (ref 8.6–10.3)
Chloride: 103 mmol/L (ref 98–110)
Creat: 1.05 mg/dL (ref 0.70–1.25)
GFR, Est African American: 83 mL/min (ref >=60)
GFR, Est Non African American: 72 mL/min (ref >=60)
Glucose, Bld: 99 mg/dL (ref 65–99)
Potassium: 4.8 mmol/L (ref 3.5–5.3)
Sodium: 137 mmol/L (ref 135–146)

## 2015-07-06 LAB — LIPID PANEL
Cholesterol: 120 mg/dL — ABNORMAL LOW (ref 125–200)
HDL: 31 mg/dL — ABNORMAL LOW (ref >=40)
LDL Cholesterol: 69 mg/dL (ref <130)
Total CHOL/HDL Ratio: 3.9 Ratio (ref <=5.0)
Triglycerides: 102 mg/dL (ref <150)
VLDL: 20 mg/dL (ref <30)

## 2015-07-06 LAB — MAGNESIUM: Magnesium: 2 mg/dL (ref 1.5–2.5)

## 2015-07-06 LAB — HEPATIC FUNCTION PANEL
ALT: 14 U/L (ref 9–46)
AST: 16 U/L (ref 10–35)
Albumin: 4.1 g/dL (ref 3.6–5.1)
Alkaline Phosphatase: 57 U/L (ref 40–115)
Bilirubin, Direct: 0.2 mg/dL (ref <=0.2)
Indirect Bilirubin: 0.6 mg/dL (ref 0.2–1.2)
Total Bilirubin: 0.8 mg/dL (ref 0.2–1.2)
Total Protein: 7.3 g/dL (ref 6.1–8.1)

## 2015-07-06 LAB — TSH: TSH: 0.937 u[IU]/mL (ref 0.350–4.500)

## 2015-07-06 MED ORDER — ATORVASTATIN CALCIUM 80 MG PO TABS: ORAL_TABLET | ORAL | Status: DC

## 2015-07-06 NOTE — Progress Notes (Signed)
Assessment and Plan:  Questionable Hypertension: Continue medication, monitor blood pressure at home. Continue DASH diet.  Reminder to go to the ER if any CP, SOB, nausea, dizziness, severe HA, changes vision/speech, left arm numbness and tingling and jaw pain. Cholesterol: Continue diet and exercise. Check cholesterol.  Diabetes with CKD stage II -Continue diet and exercise. Check A1C, discussed ACE/ARB,does not want it added at this time.  Vitamin D Def- check level and continue medications.  Obesity with co morbidities- long discussion about weight loss, diet, and exercise Depression screen negative.   Continue diet and meds as discussed. Further disposition pending results of labs. Discussed med's effects and SE's.    HPI 69 y.o. AA male  presents for 3 month follow up with hypertension, hyperlipidemia, diabetes and vitamin D. His blood pressure has been controlled at home, today their BP is BP: 130/70 mmHg He does workout. He denies chest pain, shortness of breath, dizziness.  He is on cholesterol medication, he is on lipitor 80 mg 1/2 pill 3 days a week and denies myalgias. His cholesterol is at goal. The cholesterol was:  03/16/2015: Cholesterol 141; HDL 33*; LDL Cholesterol 85; Triglycerides 116 He has been working on diet and exercise for Diabetes on insulin but admits to eating poorly due to weddings, etc, in the AM it has been running 140-175, he is on levemir 25 units, farxiga and metformin, he is on bASA, he is not on ACE/ARB and wishes to not start at this time and denies polydipsia and polyuria. He has had a history of numbness in his feet but states only when he wears hit wallet/sits for long time and very sporadic. Last A1C  was: 03/16/2015: Hgb A1c MFr Bld 8.4*  Lab Results  Component Value Date   GFRAA >89 03/16/2015   Patient is on Vitamin D supplement. 03/16/2015: Vit D, 25-Hydroxy 34  BMI is Body mass index is 33 kg/(m^2)., he is working on diet and exercise. Wt Readings  from Last 3 Encounters:  07/06/15 214 lb (97.07 kg)  03/16/15 213 lb (96.616 kg)  12/07/14 216 lb 11.2 oz (98.294 kg)    Current Medications:  Current Outpatient Prescriptions on File Prior to Visit  Medication Sig Dispense Refill  . ALLERGY RELIEF 10 MG tablet TAKE ONE TABLET BY MOUTH DAILY 90 tablet 4  . aspirin 81 MG tablet Take 81 mg by mouth daily.    Marland Kitchen atenolol (TENORMIN) 25 MG tablet TAKE ONE TABLET BY MOUTH DAILY AT BEDTIME 90 tablet 1  . atorvastatin (LIPITOR) 80 MG tablet Take 0.5-1 tablets (40-80 mg total) by mouth daily. 1/2 TAB Tuesday, Thursday, SATURDAY 30 tablet 1  . atorvastatin (LIPITOR) 80 MG tablet TAKE 0.5-1 TABLETS (40-80 MG TOTAL) BY MOUTH DAILY. 1/2 TAB TUESDAY, THURSDAY, SATURDAY 90 tablet 0  . Cholecalciferol (VITAMIN D) 2000 UNITS tablet Take 2,000 Units by mouth daily.    . Dapagliflozin Propanediol (FARXIGA) 10 MG TABS 1 qd 28 tablet 0  . Insulin Pen Needle 31G X 8 MM MISC Check BS twice daily for fluctuations and medication adjustment 200 each 3  . LEVEMIR FLEXPEN 100 UNIT/ML SOPN INJECT 40-50 UNITS SUBCUTANEOUSLY DAILY AS DIRECTED 5 pen 5  . metFORMIN (GLUCOPHAGE) 1000 MG tablet Take 1 tablet (1,000 mg total) by mouth 2 (two) times daily. 60 tablet 3   No current facility-administered medications on file prior to visit.   Medical History:  Past Medical History  Diagnosis Date  . Premature beats, unspecified   . Unspecified essential  hypertension   . Other and unspecified hyperlipidemia   . Diabetes ([place]) 1992  . Iron (Fe) deficiency anemia   . Obesity   . Cataract   . Vitamin D deficiency   . Colon polyps   . Femur fracture ([facility])     left  . ED (erectile dysfunction)    Allergies:  Allergies  Allergen Reactions  . Amaryl [Glimepiride] Diarrhea  . Codeine Itching  . Pioglitazone Diarrhea, Itching and Anxiety    Review of Systems  Constitutional: Negative for fever, chills and malaise/fatigue.  HENT: Negative for congestion, ear pain and  sore throat.   Eyes: Negative.   Respiratory: Negative for cough, shortness of breath and wheezing.   Cardiovascular: Negative for chest pain, palpitations and leg swelling.  Gastrointestinal: Negative for heartburn, diarrhea, constipation, blood in stool and melena.  Genitourinary: Negative.   Neurological: Negative for dizziness, sensory change, loss of consciousness and headaches.  Psychiatric/Behavioral: Negative for depression. The patient is not nervous/anxious and does not have insomnia.      Family history- Review and unchanged Social history- Review and unchanged Physical Exam: BP 130/70 mmHg  Pulse 60  Temp(Src) 97.7 F (36.5 C) (Temporal)  Resp 16  Ht 5' 7.5" (1.715 m)  Wt 214 lb (97.07 kg)  BMI 33.00 kg/m2  SpO2 97% Wt Readings from Last 3 Encounters:  07/06/15 214 lb (97.07 kg)  03/16/15 213 lb (96.616 kg)  12/07/14 216 lb 11.2 oz (98.294 kg)   General Appearance: Well nourished, in no apparent distress. Eyes: PERRLA, EOMs, conjunctiva no swelling or erythema Sinuses: No Frontal/maxillary tenderness ENT/Mouth: Ext aud canals clear, TMs without erythema, bulging. No erythema, swelling, or exudate on post pharynx.  Tonsils not swollen or erythematous. Hearing normal.  Neck: Supple, thyroid normal.  Respiratory: Respiratory effort normal, BS equal bilaterally without rales, rhonchi, wheezing or stridor.  Cardio: RRR with no MRGs. Brisk peripheral pulses without edema.  Abdomen: Soft, + BS.  Non tender, no guarding, rebound, hernias, masses. Lymphatics: Non tender without lymphadenopathy.  Musculoskeletal: Full ROM, 5/5 strength, normal gait.  Skin: Warm, dry without rashes, lesions, ecchymosis.  Neuro: Cranial nerves intact. No cerebellar symptoms. Sensation intact.  Psych: Awake and oriented X 3, normal affect, Insight and Judgment appropriate.    Vicie Mutters, PA-C 10:00 AM [facility]

## 2015-07-06 NOTE — Patient Instructions (Signed)
Diabetes is a very complicated disease...lets simplify it.  An easy way to look at it to understand the complications is if you think of the extra sugar floating in your blood stream as glass shards floating through your blood stream.    Diabetes affects your small vessels first: 1) The glass shards (sugar) scraps down the tiny blood vessels in your eyes and lead to diabetic retinopathy, the leading cause of blindness in the [place]. Diabetes is the leading cause of newly diagnosed adult (20 to 70 years of age) blindness in the [place].  2) The glass shards scratches down the tiny vessels of your legs leading to nerve damage called neuropathy and can lead to amputations of your feet. More than 60% of all non-traumatic amputations of lower limbs occur in people with diabetes.  3) Over time the small vessels in your brain are shredded and closed off, individually this does not cause any problems but over a long period of time many of the small vessels being blocked can lead to Vascular Dementia.   4) Your kidney's are a filter system and have a "net" that keeps certain things in the body and lets bad things out. Sugar shreds this net and leads to kidney damage and eventually failure. Decreasing the sugar that is destroying the net and certain blood pressure medications can help stop or decrease progression of kidney disease. Diabetes was the primary cause of kidney failure in 44 percent of all new cases in 2011.  5) Diabetes also destroys the small vessels in your penis that lead to erectile dysfunction. Eventually the vessels are so damaged that you may not be responsive to cialis or viagra.   Diabetes and your large vessels: Your larger vessels consist of your coronary arteries in your heart and the carotid vessels to your brain. Diabetes or even increased sugars put you at 300% increased risk of heart attack and stroke and this is why.. The sugar scrapes down your large blood vessels and your body  sees this as an internal injury and tries to repair itself. Just like you get a scab on your skin, your platelets will stick to the blood vessel wall trying to heal it. This is why we have diabetics on low dose aspirin daily, this prevents the platelets from sticking and can prevent plaque formation. In addition, your body takes cholesterol and tries to shove it into the open wound. This is why we want your LDL, or bad cholesterol, below 70.   The combination of platelets and cholesterol over 5-10 years forms plaque that can break off and cause a heart attack or stroke.   PLEASE REMEMBER:  Diabetes is preventable! Up to 85 percent of complications and morbidities among individuals with type 2 diabetes can be prevented, delayed, or effectively treated and minimized with regular visits to a health professional, appropriate monitoring and medication, and a healthy diet and lifestyle.     Bad carbs also include fruit juice, alcohol, and sweet tea. These are empty calories that do not signal to your brain that you are full.   Please remember the good carbs are still carbs which convert into sugar. So please measure them out no more than 1/2-1 cup of rice, oatmeal, pasta, and beans  Veggies are however free foods! Pile them on.   Not all fruit is created equal. Please see the list below, the fruit at the bottom is higher in sugars than the fruit at the top. Please avoid all dried fruits.       ACE inhibitors are blood pressure medications that protect your heart and kidneys. It can cause two symptoms: The most common symptom is a dry cough/tickle in your throat that can happen the first day you take it or 5 years after you have been taking it. Please call us if you have this and we can switch it to a different medications. The least common side effect is called angioedema which is swelling of your lips and tongue and can cause problems with your breathing. This is a very very rare side effect but very  serious. If this happens please stop the medication and go to the ER.

## 2015-07-07 LAB — CBC WITH DIFFERENTIAL/PLATELET
Basophils Absolute: 0.1 10*3/uL (ref 0.0–0.1)
Basophils Relative: 1 % (ref 0–1)
Eosinophils Absolute: 0.4 10*3/uL (ref 0.0–0.7)
Eosinophils Relative: 3 % (ref 0–5)
HCT: 40.9 % (ref 39.0–52.0)
Hemoglobin: 12.6 g/dL — ABNORMAL LOW (ref 13.0–17.0)
Lymphocytes Relative: 76 % — ABNORMAL HIGH (ref 12–46)
Lymphs Abs: 9.2 10*3/uL — ABNORMAL HIGH (ref 0.7–4.0)
MCH: 22.8 pg — ABNORMAL LOW (ref 26.0–34.0)
MCHC: 30.8 g/dL (ref 30.0–36.0)
MCV: 74 fL — ABNORMAL LOW (ref 78.0–100.0)
MPV: 8.9 fL (ref 8.6–12.4)
Monocytes Absolute: 0.5 10*3/uL (ref 0.1–1.0)
Monocytes Relative: 4 % (ref 3–12)
Neutro Abs: 1.9 10*3/uL (ref 1.7–7.7)
Neutrophils Relative %: 16 % — ABNORMAL LOW (ref 43–77)
Platelets: 258 10*3/uL (ref 150–400)
RBC: 5.53 MIL/uL (ref 4.22–5.81)
RDW: 16.3 % — ABNORMAL HIGH (ref 11.5–15.5)
WBC: 12.1 10*3/uL — ABNORMAL HIGH (ref 4.0–10.5)

## 2015-07-07 LAB — HEMOGLOBIN A1C
Hgb A1c MFr Bld: 8.2 % — ABNORMAL HIGH (ref <5.7)
Mean Plasma Glucose: 189 mg/dL — ABNORMAL HIGH (ref <117)

## 2015-07-07 LAB — VITAMIN D 25 HYDROXY (VIT D DEFICIENCY, FRACTURES): Vit D, 25-Hydroxy: 38 ng/mL (ref 30–100)

## 2015-07-07 LAB — HEPATITIS C ANTIBODY: HCV Ab: NEGATIVE

## 2015-07-07 LAB — PATHOLOGIST SMEAR REVIEW

## 2015-08-08 ENCOUNTER — Encounter: Payer: Self-pay | Admitting: Gastroenterology

## 2015-08-12 ENCOUNTER — Other Ambulatory Visit: Payer: Self-pay | Admitting: Internal Medicine

## 2015-09-05 ENCOUNTER — Ambulatory Visit: Payer: Self-pay | Admitting: Cardiology

## 2015-10-04 ENCOUNTER — Other Ambulatory Visit: Payer: Self-pay | Admitting: Physician Assistant

## 2015-10-18 ENCOUNTER — Other Ambulatory Visit: Payer: Self-pay | Admitting: Internal Medicine

## 2015-10-19 ENCOUNTER — Ambulatory Visit: Payer: Self-pay | Admitting: Internal Medicine

## 2015-11-09 ENCOUNTER — Ambulatory Visit: Payer: Self-pay | Admitting: Internal Medicine

## 2015-11-15 ENCOUNTER — Other Ambulatory Visit: Payer: Self-pay | Admitting: Internal Medicine

## 2015-11-15 ENCOUNTER — Encounter: Payer: Self-pay | Admitting: Internal Medicine

## 2015-11-15 ENCOUNTER — Ambulatory Visit (INDEPENDENT_AMBULATORY_CARE_PROVIDER_SITE_OTHER): Payer: Medicare Other | Admitting: Physician Assistant

## 2015-11-15 VITALS — BP 102/70 | HR 68 | Temp 97.5°F | Resp 16 | Ht 67.5 in | Wt 212.8 lb

## 2015-11-15 DIAGNOSIS — I10 Essential (primary) hypertension: Secondary | ICD-10-CM

## 2015-11-15 DIAGNOSIS — R6889 Other general symptoms and signs: Secondary | ICD-10-CM | POA: Diagnosis not present

## 2015-11-15 DIAGNOSIS — Z0001 Encounter for general adult medical examination with abnormal findings: Secondary | ICD-10-CM | POA: Diagnosis not present

## 2015-11-15 DIAGNOSIS — E559 Vitamin D deficiency, unspecified: Secondary | ICD-10-CM

## 2015-11-15 DIAGNOSIS — I493 Ventricular premature depolarization: Secondary | ICD-10-CM | POA: Diagnosis not present

## 2015-11-15 DIAGNOSIS — N182 Chronic kidney disease, stage 2 (mild): Secondary | ICD-10-CM

## 2015-11-15 DIAGNOSIS — D509 Iron deficiency anemia, unspecified: Secondary | ICD-10-CM | POA: Diagnosis not present

## 2015-11-15 DIAGNOSIS — E1122 Type 2 diabetes mellitus with diabetic chronic kidney disease: Secondary | ICD-10-CM | POA: Diagnosis not present

## 2015-11-15 DIAGNOSIS — E782 Mixed hyperlipidemia: Secondary | ICD-10-CM | POA: Diagnosis not present

## 2015-11-15 DIAGNOSIS — Z79899 Other long term (current) drug therapy: Secondary | ICD-10-CM

## 2015-11-15 DIAGNOSIS — Z0000 Encounter for general adult medical examination without abnormal findings: Secondary | ICD-10-CM

## 2015-11-15 LAB — CBC WITH DIFFERENTIAL/PLATELET
Basophils Absolute: 0.1 10*3/uL (ref 0.0–0.1)
Basophils Relative: 1 % (ref 0–1)
Eosinophils Absolute: 0.3 10*3/uL (ref 0.0–0.7)
Eosinophils Relative: 2 % (ref 0–5)
HCT: 40.9 % (ref 39.0–52.0)
Hemoglobin: 13 g/dL (ref 13.0–17.0)
Lymphocytes Relative: 77 % — ABNORMAL HIGH (ref 12–46)
Lymphs Abs: 11.1 10*3/uL — ABNORMAL HIGH (ref 0.7–4.0)
MCH: 22.9 pg — ABNORMAL LOW (ref 26.0–34.0)
MCHC: 31.8 g/dL (ref 30.0–36.0)
MCV: 72.1 fL — ABNORMAL LOW (ref 78.0–100.0)
MPV: 9.6 fL (ref 8.6–12.4)
Monocytes Absolute: 0.4 10*3/uL (ref 0.1–1.0)
Monocytes Relative: 3 % (ref 3–12)
Neutro Abs: 2.4 10*3/uL (ref 1.7–7.7)
Neutrophils Relative %: 17 % — ABNORMAL LOW (ref 43–77)
Platelets: 273 10*3/uL (ref 150–400)
RBC: 5.67 MIL/uL (ref 4.22–5.81)
RDW: 16.1 % — ABNORMAL HIGH (ref 11.5–15.5)
WBC: 14.4 10*3/uL — ABNORMAL HIGH (ref 4.0–10.5)

## 2015-11-15 LAB — BASIC METABOLIC PANEL WITH GFR
BUN: 14 mg/dL (ref 7–25)
CO2: 26 mmol/L (ref 20–31)
Calcium: 9.6 mg/dL (ref 8.6–10.3)
Chloride: 100 mmol/L (ref 98–110)
Creat: 1.09 mg/dL (ref 0.70–1.18)
GFR, Est African American: 79 mL/min (ref >=60)
GFR, Est Non African American: 68 mL/min (ref >=60)
Glucose, Bld: 135 mg/dL — ABNORMAL HIGH (ref 65–99)
Potassium: 5 mmol/L (ref 3.5–5.3)
Sodium: 137 mmol/L (ref 135–146)

## 2015-11-15 LAB — HEPATIC FUNCTION PANEL
ALT: 13 U/L (ref 9–46)
AST: 16 U/L (ref 10–35)
Albumin: 4.4 g/dL (ref 3.6–5.1)
Alkaline Phosphatase: 59 U/L (ref 40–115)
Bilirubin, Direct: 0.2 mg/dL (ref <=0.2)
Indirect Bilirubin: 0.6 mg/dL (ref 0.2–1.2)
Total Bilirubin: 0.8 mg/dL (ref 0.2–1.2)
Total Protein: 7.5 g/dL (ref 6.1–8.1)

## 2015-11-15 LAB — LIPID PANEL
Cholesterol: 156 mg/dL (ref 125–200)
HDL: 33 mg/dL — ABNORMAL LOW (ref >=40)
LDL Cholesterol: 105 mg/dL (ref <130)
Total CHOL/HDL Ratio: 4.7 Ratio (ref <=5.0)
Triglycerides: 90 mg/dL (ref <150)
VLDL: 18 mg/dL (ref <30)

## 2015-11-15 LAB — MAGNESIUM: Magnesium: 1.9 mg/dL (ref 1.5–2.5)

## 2015-11-15 LAB — HEMOGLOBIN A1C
Hgb A1c MFr Bld: 8.9 % — ABNORMAL HIGH (ref <5.7)
Mean Plasma Glucose: 209 mg/dL — ABNORMAL HIGH (ref <117)

## 2015-11-15 LAB — TSH: TSH: 0.73 mIU/L (ref 0.40–4.50)

## 2015-11-15 NOTE — Patient Instructions (Signed)
Diabetes is a very complicated disease...lets simplify it.  An easy way to look at it to understand the complications is if you think of the extra sugar floating in your blood stream as glass shards floating through your blood stream.    Diabetes affects your small vessels first: 1) The glass shards (sugar) scraps down the tiny blood vessels in your eyes and lead to diabetic retinopathy, the leading cause of blindness in the [place]. Diabetes is the leading cause of newly diagnosed adult (20 to 71 years of age) blindness in the [place].  2) The glass shards scratches down the tiny vessels of your legs leading to nerve damage called neuropathy and can lead to amputations of your feet. More than 60% of all non-traumatic amputations of lower limbs occur in people with diabetes.  3) Over time the small vessels in your brain are shredded and closed off, individually this does not cause any problems but over a long period of time many of the small vessels being blocked can lead to Vascular Dementia.   4) Your kidney's are a filter system and have a "net" that keeps certain things in the body and lets bad things out. Sugar shreds this net and leads to kidney damage and eventually failure. Decreasing the sugar that is destroying the net and certain blood pressure medications can help stop or decrease progression of kidney disease. Diabetes was the primary cause of kidney failure in 44 percent of all new cases in 2011.  5) Diabetes also destroys the small vessels in your penis that lead to erectile dysfunction. Eventually the vessels are so damaged that you may not be responsive to cialis or viagra.   Diabetes and your large vessels: Your larger vessels consist of your coronary arteries in your heart and the carotid vessels to your brain. Diabetes or even increased sugars put you at 300% increased risk of heart attack and stroke and this is why.. The sugar scrapes down your large blood vessels and your body  sees this as an internal injury and tries to repair itself. Just like you get a scab on your skin, your platelets will stick to the blood vessel wall trying to heal it. This is why we have diabetics on low dose aspirin daily, this prevents the platelets from sticking and can prevent plaque formation. In addition, your body takes cholesterol and tries to shove it into the open wound. This is why we want your LDL, or bad cholesterol, below 70.   The combination of platelets and cholesterol over 5-10 years forms plaque that can break off and cause a heart attack or stroke.   PLEASE REMEMBER:  Diabetes is preventable! Up to 85 percent of complications and morbidities among individuals with type 2 diabetes can be prevented, delayed, or effectively treated and minimized with regular visits to a health professional, appropriate monitoring and medication, and a healthy diet and lifestyle.  Targets for Glucose Readings: Time of Check Usual Target for Most People  Before Meals  70-130  Two hours after meals  Less than 180  Bedtime  90-150   Why Should You Check Your Blood Glucose? -The A1C tells you how your diabetes is doing over a 3 month period.  Home blood glucose monitoring (or checking) gives you information about your diabetes on a daily basis.  You will learn how well your diabetes care plan is working and whether your blood glucose is in your target range throughout the day.   -Reviewing daily blood   glucose levels will help you and your healthcare team make any needed changes to you meal plan, physical activity and medications.  Meter Supplies: - A glucose meter was provided at your visit today along with strips and lancets. The blood glucose meter strips and lancets are usually covered by health insurance. Please let us know if they are not and contact your insurance to see which meter they prefer.  How to get a good blood sample: -Wash your hands in warm water.  You do not need to use alcohol  wipes. -Massage your hands. -Choose which finger you will use.  It helps to use a different finger each time to avoid soreness. -Keep you hand below your wrist when using you lancet to "prick" your finger. -Apply gentle pressure, but do NOT squeeze your finger.  Checking your blood glucose Important Reminders: -Make sure your strips are not expired.  Check the date on the bottle. -Make sure the code on bottle matches the code on your machine. -Make sure your hands are clean and dry. -Do not use the center of your finger, it is the most sensitive area.  Use a spot to the side of the center of your fingertip. -Completely fill the strip target area with blood (until it beeps) to make sure the results are accurate. -You will need a prescription to have your glucose strips and lancets covered by insurance. -There is usually an 800 number on the back of your meter for help with meter issues.  How often to check: -If you take diabetes pills or take one injection of insulin each day, you will usually be asked to check twice a day, before breakfast and 2 hours after one meal, or as directed. -If you take several insulin injections each day, you will usually be asked to check four times a day before meals and at bedtime every day.   

## 2015-11-15 NOTE — Progress Notes (Signed)
Patient ID: Nathan Davis, male   DOB: 12-Nov-1945, 70 y.o.   MRN: [id number]  MEDICARE ANNUAL WELLNESS VISIT AND FOLLOW UP VISIT  Assessment:   1. Mixed hyperlipidemia -continue medications, check lipids, decrease fatty foods, increase activity.  - Lipid panel  2. CKD stage 2 due to type 2 diabetes mellitus [facility]) Discussed general issues about diabetes pathophysiology and management., Educational material distributed., Suggested low cholesterol diet., Encouraged aerobic exercise., Discussed foot care., Reminded to get yearly retinal exam. - Hemoglobin A1c  3. Essential hypertension - continue medications, DASH diet, exercise and monitor at home. Call if greater than 130/80.  - CBC with Differential/Platelet - BASIC METABOLIC PANEL WITH GFR - Hepatic function panel - TSH  4. IRON DEFICIENCY Check CBC  5. Vitamin D deficiency - VITAMIN D 25 Hydroxy (Vit-D Deficiency, Fractures)  6. Medication management - Magnesium  7. PVC (premature ventricular contraction)   Plan:   During the course of the visit the patient was educated and counseled about appropriate screening and preventive services including:    Pneumococcal vaccine   Influenza vaccine  Td vaccine  Screening electrocardiogram  Bone densitometry screening  Colorectal cancer screening  Diabetes screening  Glaucoma screening  Nutrition counseling   Advanced directives: requested  Conditions/risks identified: BMI: Discussed weight loss, diet, and increase physical activity.  Increase physical activity: AHA recommends 150 minutes of physical activity a week.  Medications reviewed Diabetes is not at goal, ACE/ARB therapy: no Urinary Incontinence is not an issue: discussed non pharmacology and pharmacology options.  Fall risk: low- discussed PT, home fall assessment, medications.   Subjective:    Nathan Davis  presents for Medicare Annual Wellness Visit and complete physical.  No prior  medicare wellness visit is known. This very nice 70 y.o. MBM presents for 3 month follow up with Hypertension, Hyperlipidemia, Pre-Diabetes and Vitamin D Deficiency.    Patient is treated for HTN since 2011 & BP has been controlled at home. Today's BP: 102/70 mmHg. Patient did have a negative Cardiolite in 2011. Patient has had no complaints of any cardiac type chest pain, palpitations, dyspnea/orthopnea/PND, dizziness, claudication, or dependent edema. Patient has hx/o benign or unifocal VPB's and is followed by Dr Aundra Dubin annually.  Hyperlipidemia is controlled with diet & meds. Patient denies myalgias or other med SE's. Last Lipids were at goal - Lab Results  Component Value Date   CHOL 120* 07/06/2015   HDL 31* 07/06/2015   LDLCALC 69 07/06/2015   TRIG 102 07/06/2015   CHOLHDL 3.9 07/06/2015   Also, the patient has history of T2_IDDM w/CKD2  (GFR 88 ml/min - improved over the last year ) and has had 1 episode of reactive hypoglycemia while at work, denies diabetic polys, paresthesias or visual blurring.  He is Morbidly Obese (BMI 33.42) and admittedly is not dietary compliant. He reports FBG's usu range 140's -150's.  Lab Results  Component Value Date   HGBA1C 8.2* 07/06/2015   Further, the patient also has history of Vitamin D Deficiency and supplements vitamin D without any suspected side-effects.  Lab Results  Component Value Date   VD25OH 38 07/06/2015    Names of Other Physician/Practitioners you currently use: 1. [facility] here for primary care 2. Dr Gershon Crane, eye doctor, next Diabetic Eye Exam  scheduled for 12/09/2014 3. No dentist, has dentures  Patient Care Team: [name], MD as PCP - General (Internal Medicine) [name], MD as Attending Physician (Internal Medicine) Rutherford Guys, MD  as Consulting Physician (Ophthalmology) Larey Dresser, MD as Consulting Physician (Cardiology) Inda Castle, MD as Consulting  Physician (Gastroenterology)  Medication Review:   Medication List       This list is accurate as of: 11/15/15 12:47 PM.  Always use your most recent med list.               ALLERGY RELIEF 10 MG tablet  Generic drug:  loratadine  TAKE ONE TABLET BY MOUTH DAILY     aspirin 81 MG tablet  Take 81 mg by mouth daily.     atenolol 25 MG tablet  Commonly known as:  TENORMIN  TAKE ONE TABLET BY MOUTH DAILY AT BEDTIME     atorvastatin 80 MG tablet  Commonly known as:  LIPITOR  TAKE ONE TABLET BY MOUTH DAILY     dapagliflozin propanediol 10 MG Tabs tablet  Commonly known as:  FARXIGA  1 qd     Insulin Pen Needle 31G X 8 MM Misc  Check BS twice daily for fluctuations and medication adjustment     LEVEMIR FLEXPEN 100 UNIT/ML Pen  Generic drug:  Insulin Detemir  INJECT 40-50 UNITS SUBCUTANEOUSLY DAILY AS DIRECTED     metFORMIN 1000 MG tablet  Commonly known as:  GLUCOPHAGE  TAKE 1 TABLET (1,000 MG TOTAL) BY MOUTH 2 (TWO) TIMES DAILY.     Vitamin D 2000 units tablet  Take 2,000 Units by mouth daily.       Current Problems (verified) Patient Active Problem List   Diagnosis Date Noted  . PVC (premature ventricular contraction) 02/04/2014  . Essential hypertension 12/09/2013  . Vitamin D deficiency 12/09/2013  . Medication management 12/09/2013  . T1_IDDM w/CKD 01/04/2010  . IRON DEFICIENCY 01/04/2010  . Mixed hyperlipidemia 10/19/2009   Screening Tests Health Maintenance  Topic Date Due  . PNA vac Low Risk Adult (2 of 2 - PPSV23) 08/21/2012  . FOOT EXAM  12/07/2015  . URINE MICROALBUMIN  12/07/2015  . OPHTHALMOLOGY EXAM  12/23/2015  . HEMOGLOBIN A1C  01/04/2016  . INFLUENZA VACCINE  04/17/2016  . COLONOSCOPY  06/28/2020  . TETANUS/TDAP  08/28/2023  . ZOSTAVAX  Completed  . Hepatitis C Screening  Completed    Immunization History  Administered Date(s) Administered  . Influenza, High Dose Seasonal PF 07/22/2013  . Influenza-Unspecified 06/25/2012  . PPD Test  08/27/2013  . Pneumococcal Conjugate-13 08/22/2011  . Td 09/17/2002  . Tdap 08/27/2013  . Zoster 09/17/2005   Preventative care: Last colonoscopy: 10/17/2010  Influenza 2016 at old work Prevnar 13: 2012 Pneumo will get at [facility]PE TDAP 2014 Zoster 2007  History reviewed: allergies, current medications, past family history, past medical history, past social history, past surgical history and problem list  Allergies Allergies  Allergen Reactions  . Amaryl [Glimepiride] Diarrhea  . Codeine Itching  . Pioglitazone Diarrhea, Itching and Anxiety   Surgical history Past Surgical History  Procedure Laterality Date  . Ett - myoview      7'45", stopped due to fatigue, no chest pain. EF55%, no ischemia or infarction  . Hernia repair    . [employer] fundoplication N/A [id number]   Family history Family History  Problem Relation Age of Onset  . Hypertension      family history  . Heart attack  79  . Throat cancer Mother   . Alzheimer's disease Father     Risk Factors: Tobacco Social History  Substance Use Topics  . Smoking status: Never Smoker   . Smokeless tobacco:  Never Used  . Alcohol Use: No   He does not smoke.  Patient is not a former smoker. Are there smokers in your home (other than you)?  No  Alcohol Current alcohol use: none  Caffeine Current caffeine use: coffee 2 cups /day  Exercise Current exercise: cardiovascular workout on exercise equipment, walking and yard work  Nutrition/Diet Current diet: in general, an "unhealthy" diet  Cardiac risk factors: advanced age (older than 79 for men, 17 for women), diabetes mellitus, hypertension, male gender, sedentary lifestyle and smoking/ tobacco exposure.  Depression Screen (Note: if answer to either of the following is "Yes", a more complete depression screening is indicated)   Q1: Over the past two weeks, have you felt down, depressed or hopeless? No  Q2: Over the past two weeks, have you felt little interest or  pleasure in doing things? No  Have you lost interest or pleasure in daily life? No  Do you often feel hopeless? No  Do you cry easily over simple problems? No  Activities of Daily Living In your present state of health, do you have any difficulty performing the following activities?:  Driving? No Managing money?  No Feeding yourself? No Getting from bed to chair? No Climbing a flight of stairs? No Preparing food and eating?: No Bathing or showering? No Getting dressed: No Getting to the toilet? No Using the toilet:No Moving around from place to place: No In the past year have you fallen or had a near fall?:No  Vision Difficulties: No  Hearing Difficulties: No Do you often ask people to speak up or repeat themselves? No Do you experience ringing or noises in your ears? No Do you have difficulty understanding soft or whispered voices? No  Cognition  Do you feel that you have a problem with memory?No  Do you often misplace items? No  Do you feel safe at home?  Yes  Advanced directives Does patient have a [place]? No - given forms Does patient have a Living Will? No - given forms   Objective:     Blood pressure 102/70, pulse 68, temperature 97.5 F (36.4 C), resp. rate 16, height 5' 7.5" (1.715 m), weight 212 lb 12.8 oz (96.525 kg).   General Appearance:  Alert  WD/WN, male , in no apparent distress. Eyes: PERRLA, EOMs, conjunctiva no swelling or erythema, normal fundi and vessels.No diabetic retinopathy noted.  Sinuses: No frontal/maxillary tenderness ENT/Mouth: EACs patent / TMs  nl. Nares clear without erythema, swelling, mucoid exudates. Oral hygiene is good. No erythema, swelling, or exudate. Tongue normal, non-obstructing. Tonsils not swollen or erythematous. Hearing normal.  Neck: Supple, thyroid normal. No bruits, nodes or JVD. Respiratory: Respiratory effort normal.  BS equal and clear bilateral without rales, rhonci, wheezing or  stridor. Cardio: Heart sounds are normal with regular rate and rhythm and no murmurs, rubs or gallops. Peripheral pulses are normal and equal bilaterally without edema. No aortic or femoral bruits. No signs of PVD. Chest: symmetric with normal excursions and percussion.  Abdomen: Flat, soft, with nl bowel sounds. Nontender, no guarding, rebound, hernias, masses, or organomegaly.  Lymphatics: Non tender without lymphadenopathy.  Musculoskeletal: Full ROM all peripheral extremities, joint stability, 5/5 strength, and normal gait. Skin: Warm and dry without rashes, lesions, cyanosis, clubbing or  ecchymosis.  Neuro: Cranial nerves intact, reflexes equal bilaterally. Normal muscle tone, no cerebellar symptoms. Sensation intact.  Pysch: Awake and oriented X 3 with normal affect, insight and judgment appropriate.   Cognitive Testing  Alert? Yes  Normal Appearance? Yes  Oriented to person? Yes  Place? Yes   Time? Yes  Recall of three objects?  Yes  Can perform simple calculations? Yes  Displays appropriate judgment? Yes  Can read the correct time from a watch/clock? Yes  Medicare Attestation I have personally reviewed: The patient's medical and social history Their use of alcohol, tobacco or illicit drugs Their current medications and supplements The patient's functional ability including ADLs,fall risks, home safety risks, cognitive, and hearing and visual impairment Diet and physical activities - encouraged continue daily exercises Evidence for depression or mood disorders  The patient's weight, height, BMI, and visual acuity have been recorded in the chart.  I have made referrals, counseling, and provided education to the patient based on review of the above and I have provided the patient with a written personalized care plan for preventive services.  Over 40 minutes of exam, counseling, chart review and critical decision making was performed.   Vicie Mutters, PA-C   11/15/2015

## 2015-11-16 ENCOUNTER — Other Ambulatory Visit: Payer: Self-pay | Admitting: Physician Assistant

## 2015-11-16 DIAGNOSIS — D72820 Lymphocytosis (symptomatic): Secondary | ICD-10-CM

## 2015-11-16 DIAGNOSIS — D509 Iron deficiency anemia, unspecified: Secondary | ICD-10-CM

## 2015-11-16 LAB — VITAMIN D 25 HYDROXY (VIT D DEFICIENCY, FRACTURES): Vit D, 25-Hydroxy: 33 ng/mL (ref 30–100)

## 2015-11-23 ENCOUNTER — Other Ambulatory Visit: Payer: Self-pay | Admitting: *Deleted

## 2015-11-23 ENCOUNTER — Ambulatory Visit: Payer: Self-pay

## 2015-11-23 MED ORDER — METFORMIN HCL 1000 MG PO TABS: ORAL_TABLET | ORAL | Status: DC

## 2015-11-27 ENCOUNTER — Other Ambulatory Visit: Payer: Self-pay | Admitting: Internal Medicine

## 2015-11-28 ENCOUNTER — Other Ambulatory Visit: Payer: Self-pay | Admitting: Internal Medicine

## 2015-11-28 ENCOUNTER — Ambulatory Visit: Payer: Self-pay

## 2015-12-07 ENCOUNTER — Ambulatory Visit: Payer: Self-pay

## 2015-12-07 ENCOUNTER — Other Ambulatory Visit: Payer: Self-pay | Admitting: Internal Medicine

## 2015-12-07 ENCOUNTER — Ambulatory Visit (INDEPENDENT_AMBULATORY_CARE_PROVIDER_SITE_OTHER): Payer: Medicare Other | Admitting: *Deleted

## 2015-12-07 DIAGNOSIS — D72820 Lymphocytosis (symptomatic): Secondary | ICD-10-CM | POA: Diagnosis not present

## 2015-12-07 DIAGNOSIS — D509 Iron deficiency anemia, unspecified: Secondary | ICD-10-CM | POA: Diagnosis not present

## 2015-12-07 LAB — IRON AND TIBC
%SAT: 17 % (ref 15–60)
Iron: 54 ug/dL (ref 50–180)
TIBC: 312 ug/dL (ref 250–425)
UIBC: 258 ug/dL (ref 125–400)

## 2015-12-07 LAB — FERRITIN: Ferritin: 36 ng/mL (ref 20–380)

## 2015-12-07 NOTE — Progress Notes (Signed)
Patient ID: Nathan Davis, male   DOB: 08-24-1946, 70 y.o.   MRN: [id number] Patient here for [place] to have labs drawn to evaluate anemia per Irving Shows

## 2015-12-08 ENCOUNTER — Other Ambulatory Visit: Payer: Self-pay | Admitting: Internal Medicine

## 2015-12-08 DIAGNOSIS — C9110 Chronic lymphocytic leukemia of B-cell type not having achieved remission: Secondary | ICD-10-CM

## 2015-12-08 LAB — REFLEX BLAST SCREEN

## 2015-12-08 LAB — FOLATE RBC: RBC Folate: 589 ng/mL

## 2015-12-08 LAB — ANA: Anti Nuclear Antibody(ANA): NEGATIVE

## 2015-12-08 LAB — HIV ANTIBODY (ROUTINE TESTING W REFLEX): HIV 1&2 Ab, 4th Generation: NONREACTIVE

## 2015-12-08 LAB — ANTI-DNA ANTIBODY, DOUBLE-STRANDED: ds DNA Ab: <1 IU/mL

## 2015-12-08 LAB — IMMUNOPHENOTYPING BY FLOW CYTOMETRY

## 2015-12-13 ENCOUNTER — Encounter: Payer: Self-pay | Admitting: Oncology

## 2015-12-14 ENCOUNTER — Other Ambulatory Visit: Payer: Self-pay

## 2015-12-14 MED ORDER — AZITHROMYCIN 250 MG PO TABS: ORAL_TABLET | ORAL | Status: DC

## 2015-12-16 ENCOUNTER — Encounter: Payer: Self-pay | Admitting: Cardiology

## 2015-12-16 ENCOUNTER — Ambulatory Visit (INDEPENDENT_AMBULATORY_CARE_PROVIDER_SITE_OTHER): Payer: Medicare Other | Admitting: Cardiology

## 2015-12-16 VITALS — BP 106/68 | HR 58 | Ht 68.0 in | Wt 231.2 lb

## 2015-12-16 DIAGNOSIS — I493 Ventricular premature depolarization: Secondary | ICD-10-CM

## 2015-12-16 DIAGNOSIS — E782 Mixed hyperlipidemia: Secondary | ICD-10-CM

## 2015-12-16 NOTE — Patient Instructions (Signed)
Medication Instructions:  Your physician recommends that you continue on your current medications as directed. Please refer to the Current Medication list given to you today.   Labwork: None   Testing/Procedures: None   Follow-Up: Your physician wants you to follow-up in: 1 year with Dr Aundra Dubin. (March 2018). You will receive a reminder letter in the mail two months in advance. If you don't receive a letter, please call our office to schedule the follow-up appointment.   Any Other Special Instructions Will Be Listed Below (If Applicable).     If you need a refill on your cardiac medications before your next appointment, please call your pharmacy.

## 2015-12-18 NOTE — Progress Notes (Signed)
Patient ID: Nathan Davis, male   DOB: 1946-08-06, 70 y.o.   MRN: [id number] PCP: Dr. Melford Aase  70 yo with history of diabetes, HTN, PVCs, and hyperlipidemia presents for cardiology followup.  He had a normal myoview in 2011.  He has done well symptomatically recently.  He has had only rare palpitations on atenolol.  No chest pain.  No exertional dyspnea.  No lightheadedness.  Walking for exercise.  Labs (11/10): K 4.3, creatinine 1.01, LDL 64, HDL 37, TSH normal  Labs (3/15): K 4.6, creatinine 0.98, LDL 64, HDL 29, TSH normal Labs (2/17): HCT 40.9, LDL 105, HDL 33  ECG: NSR, nonspecific T wave flattening.   Allergies (verified):  1) ! * Actos  2) ! Codeine   Past Medical History:  1. PVCs 2. HTN  3. Diabetes: since 1992  4. Hyperlipidemia  5. Obesity  6. Fe-deficiency anemia  7. ETT-myoview (2011): 7'45", stopped due to fatigue, no chest pain. EF 55%, no ischemia or infarction.   Family History:  HTN  Grandmother with MI in her 9s.   Social History:  Never smoked. Married with children. Works for [employer] [place] at the [employer].   Current Outpatient Prescriptions  Medication Sig Dispense Refill  . ALLERGY RELIEF 10 MG tablet TAKE ONE TABLET BY MOUTH DAILY 90 tablet 4  . aspirin 81 MG tablet Take 81 mg by mouth daily.    Marland Kitchen atenolol (TENORMIN) 25 MG tablet TAKE ONE TABLET BY MOUTH DAILY AT BEDTIME 90 tablet 0  . atorvastatin (LIPITOR) 80 MG tablet TAKE ONE TABLET BY MOUTH DAILY 90 tablet 0  . Cholecalciferol (VITAMIN D) 2000 UNITS tablet Take 2,000 Units by mouth daily.    . Dapagliflozin Propanediol (FARXIGA) 10 MG TABS 1 qd 28 tablet 0  . Insulin Pen Needle 31G X 8 MM MISC Check BS twice daily for fluctuations and medication adjustment 200 each 3  . LEVEMIR FLEXPEN 100 UNIT/ML SOPN INJECT 40-50 UNITS SUBCUTANEOUSLY DAILY AS DIRECTED 5 pen 5  . metFORMIN (GLUCOPHAGE) 1000 MG tablet TAKE 1 TABLET (1,000 MG TOTAL) BY MOUTH 2 (TWO) TIMES DAILY. 60 tablet 5   No  current facility-administered medications for this visit.    BP 106/68 mmHg  Pulse 58  Ht 5\' 8"  (1.727 m)  Wt 231 lb 3.2 oz (104.872 kg)  BMI 35.16 kg/m2 General: NAD Neck: No JVD, no thyromegaly or thyroid nodule.  Lungs: Clear to auscultation bilaterally with normal respiratory effort. CV: Nondisplaced PMI.  Heart regular S1/S2, no S3/S4, no murmur.  No peripheral edema.  No carotid bruit.  Normal pedal pulses.  Abdomen: Soft, nontender, no hepatosplenomegaly Neurologic: Alert and oriented x 3.  Psych: Normal affect. Extremities: No clubbing or cyanosis.   Assessment/Plan: 1. PVCs: Minimal palpitations.  He is on atenolol. 2. HTN: BP is well-controlled.  3. Hyperlipidemia:Good LLD in 2/17.  Followup in 1 year.   Loralie Champagne 12/18/2015

## 2015-12-19 ENCOUNTER — Encounter: Payer: Self-pay | Admitting: Internal Medicine

## 2015-12-23 ENCOUNTER — Other Ambulatory Visit (HOSPITAL_COMMUNITY)
Admission: RE | Admit: 2015-12-23 | Discharge: 2015-12-23 | Disposition: A | Discharge status: Home / Self Care | Payer: Medicare Other | Source: Ambulatory Visit | Attending: Oncology | Admitting: Oncology

## 2015-12-23 ENCOUNTER — Other Ambulatory Visit: Payer: Self-pay | Admitting: Internal Medicine

## 2015-12-23 ENCOUNTER — Ambulatory Visit (HOSPITAL_BASED_OUTPATIENT_CLINIC_OR_DEPARTMENT_OTHER): Payer: Medicare Other | Admitting: Oncology

## 2015-12-23 ENCOUNTER — Other Ambulatory Visit: Payer: Self-pay | Admitting: Oncology

## 2015-12-23 ENCOUNTER — Ambulatory Visit (HOSPITAL_BASED_OUTPATIENT_CLINIC_OR_DEPARTMENT_OTHER): Payer: Medicare Other

## 2015-12-23 ENCOUNTER — Telehealth: Payer: Self-pay | Admitting: Oncology

## 2015-12-23 VITALS — BP 146/64 | HR 56 | Temp 98.7°F | Resp 18 | Wt 211.0 lb

## 2015-12-23 DIAGNOSIS — D72820 Lymphocytosis (symptomatic): Secondary | ICD-10-CM | POA: Insufficient documentation

## 2015-12-23 LAB — CBC WITH DIFFERENTIAL/PLATELET
BASO%: 0.8 % (ref 0.0–2.0)
Basophils Absolute: 0.1 10*3/uL (ref 0.0–0.1)
EOS%: 2.9 % (ref 0.0–7.0)
Eosinophils Absolute: 0.4 10*3/uL (ref 0.0–0.5)
HCT: 39.9 % (ref 38.4–49.9)
HGB: 12.4 g/dL — ABNORMAL LOW (ref 13.0–17.1)
LYMPH%: 71.4 % — ABNORMAL HIGH (ref 14.0–49.0)
MCH: 22.1 pg — ABNORMAL LOW (ref 27.2–33.4)
MCHC: 31 g/dL — ABNORMAL LOW (ref 32.0–36.0)
MCV: 71.4 fL — ABNORMAL LOW (ref 79.3–98.0)
MONO#: 0.8 10*3/uL (ref 0.1–0.9)
MONO%: 5.3 % (ref 0.0–14.0)
NEUT#: 2.9 10*3/uL (ref 1.5–6.5)
NEUT%: 19.6 % — ABNORMAL LOW (ref 39.0–75.0)
Platelets: 311 10*3/uL (ref 140–400)
RBC: 5.59 10*6/uL (ref 4.20–5.82)
RDW: 15.3 % — ABNORMAL HIGH (ref 11.0–14.6)
WBC: 14.8 10*3/uL — ABNORMAL HIGH (ref 4.0–10.3)
lymph#: 10.5 10*3/uL — ABNORMAL HIGH (ref 0.9–3.3)

## 2015-12-23 LAB — COMPREHENSIVE METABOLIC PANEL
ALT: 16 U/L (ref 0–55)
AST: 21 U/L (ref 5–34)
Albumin: 3.8 g/dL (ref 3.5–5.0)
Alkaline Phosphatase: 64 U/L (ref 40–150)
Anion Gap: 8 mEq/L (ref 3–11)
BUN: 13.9 mg/dL (ref 7.0–26.0)
CO2: 26 mEq/L (ref 22–29)
Calcium: 9.3 mg/dL (ref 8.4–10.4)
Chloride: 104 mEq/L (ref 98–109)
Creatinine: 1 mg/dL (ref 0.7–1.3)
EGFR: 84 mL/min/{1.73_m2} — ABNORMAL LOW (ref >90)
Glucose: 100 mg/dl (ref 70–140)
Potassium: 4.7 mEq/L (ref 3.5–5.1)
Sodium: 139 mEq/L (ref 136–145)
Total Bilirubin: 0.59 mg/dL (ref 0.20–1.20)
Total Protein: 7.5 g/dL (ref 6.4–8.3)

## 2015-12-23 LAB — LACTATE DEHYDROGENASE: LDH: 237 U/L (ref 125–245)

## 2015-12-23 LAB — TECHNOLOGIST REVIEW

## 2015-12-23 NOTE — Telephone Encounter (Signed)
per po fto sch pt appt-gave pt copy of avs-sent back to lab °

## 2015-12-23 NOTE — Consult Note (Signed)
Reason for Referral: Lymphocytosis.   HPI: 70 year old gentleman currently of [place] where he lived the majority of his life. We will gentleman who is in excellent health and shape but does have history of diabetes. He remains in excellent shape and have excellent performance status and continues to work part time. He was noted to have a lymphocytosis on his recent a CBC done 11/15/2015. At that time his white cell count was 14.4 and MCV of 72. His hemoglobin was 13 with a normal platelet count. His lymphocyte percentage was 77%. His white cell count was 12,000 in October 2016. Previous CBCs dating back to 2014 have showed mild lymphocytosis. He is asymptomatic from these findings. He denied any constitutional symptoms of fevers or chills or sweats. He denied lymphadenopathy. He denied abdominal pain, as well as satiety or leg of energy. He denied any recurrent infections or recent hospitalizations. He denied any pain anywhere in his body. That includes arthralgias or myalgias. He continues to attends to activities of daily living without any decline.  He does not report any headaches, blurry vision, syncope or seizures. He does not report any fevers, chills or sweats. Does not report any chest pain, palpitation, orthopnea or leg edema. He does not report any cough, wheezing or hemoptysis. Does not report any nausea, vomiting or abdominal pain. Does not report any constipation, diarrhea, hematochezia or melena. He does not report any frequency urgency or hesitancy. He does not report any skeletal complaints. Remaining review of systems unremarkable.   Past Medical History  Diagnosis Date  . Premature beats, unspecified   . Unspecified essential hypertension   . Other and unspecified hyperlipidemia   . Diabetes ([place]) 1992  . Iron (Fe) deficiency anemia   . Obesity   . Cataract   . Vitamin D deficiency   . Colon polyps   . Femur fracture (HCC)     left  . ED (erectile dysfunction)   :  Past  Surgical History  Procedure Laterality Date  . Ett - myoview      7'45", stopped due to fatigue, no chest pain. EF55%, no ischemia or infarction  . Hernia repair    . Nissan fundoplication N/A [id number]  :   Current outpatient prescriptions:  .  ALLERGY RELIEF 10 MG tablet, TAKE ONE TABLET BY MOUTH DAILY, Disp: 90 tablet, Rfl: 4 .  aspirin 81 MG tablet, Take 81 mg by mouth daily., Disp: , Rfl:  .  atenolol (TENORMIN) 25 MG tablet, TAKE ONE TABLET BY MOUTH DAILY AT BEDTIME, Disp: 90 tablet, Rfl: 0 .  atorvastatin (LIPITOR) 80 MG tablet, TAKE ONE TABLET BY MOUTH DAILY, Disp: 90 tablet, Rfl: 0 .  Cholecalciferol (VITAMIN D) 2000 UNITS tablet, Take 2,000 Units by mouth daily., Disp: , Rfl:  .  Dapagliflozin Propanediol (FARXIGA) 10 MG TABS, 1 qd, Disp: 28 tablet, Rfl: 0 .  Insulin Pen Needle 31G X 8 MM MISC, Check BS twice daily for fluctuations and medication adjustment, Disp: 200 each, Rfl: 3 .  LEVEMIR FLEXPEN 100 UNIT/ML SOPN, INJECT 40-50 UNITS SUBCUTANEOUSLY DAILY AS DIRECTED, Disp: 5 pen, Rfl: 5 .  metFORMIN (GLUCOPHAGE) 1000 MG tablet, TAKE 1 TABLET (1,000 MG TOTAL) BY MOUTH 2 (TWO) TIMES DAILY., Disp: 60 tablet, Rfl: 5:  Allergies  Allergen Reactions  . Amaryl [Glimepiride] Diarrhea  . Codeine Itching  . Pioglitazone Diarrhea, Itching and Anxiety  :  Family History  Problem Relation Age of Onset  . Hypertension      family history  .  Heart attack  80  . Throat cancer Mother   . Alzheimer's disease Father   :  Social History   Social History  . Marital Status: Married    Spouse Name: N/A  . Number of Children: N/A  . Years of Education: N/A   Occupational History  . Not on file.   Social History Main Topics  . Smoking status: Never Smoker   . Smokeless tobacco: Never Used  . Alcohol Use: No  . Drug Use: No  . Sexual Activity: Not on file   Other Topics Concern  . Not on file   Social History Narrative  :  Pertinent items are noted in  HPI.  Exam: Blood pressure 146/64, pulse 56, temperature 98.7 F (37.1 C), temperature source Oral, resp. rate 18, weight 211 lb (95.709 kg), SpO2 98 %. General appearance: alert and cooperative Head: Normocephalic, without obvious abnormality Throat: lips, mucosa, and tongue normal; teeth and gums normal Neck: no adenopathy Back: negative Resp: clear to auscultation bilaterally Chest wall: no tenderness Cardio: regular rate and rhythm, S1, S2 normal, no murmur, click, rub or gallop GI: soft, non-tender; bowel sounds normal; no masses,  no organomegaly Extremities: extremities normal, atraumatic, no cyanosis or edema Pulses: 2+ and symmetric Skin: Skin color, texture, turgor normal. No rashes or lesions Lymph nodes: Cervical, supraclavicular, and axillary nodes normal.   CBC    Component Value Date/Time   WBC 14.4* 11/15/2015 1302   RBC 5.67 11/15/2015 1302   HGB 13.0 11/15/2015 1302   HCT 40.9 11/15/2015 1302   PLT 273 11/15/2015 1302   MCV 72.1* 11/15/2015 1302   MCH 22.9* 11/15/2015 1302   MCHC 31.8 11/15/2015 1302   RDW 16.1* 11/15/2015 1302   LYMPHSABS 11.1* 11/15/2015 1302   MONOABS 0.4 11/15/2015 1302   EOSABS 0.3 11/15/2015 1302   BASOSABS 0.1 11/15/2015 1302      Chemistry      Component Value Date/Time   NA 137 11/15/2015 1302   K 5.0 11/15/2015 1302   CL 100 11/15/2015 1302   CO2 26 11/15/2015 1302   BUN 14 11/15/2015 1302   CREATININE 1.09 11/15/2015 1302      Component Value Date/Time   CALCIUM 9.6 11/15/2015 1302   ALKPHOS 59 11/15/2015 1302   AST 16 11/15/2015 1302   ALT 13 11/15/2015 1302   BILITOT 0.8 11/15/2015 1302       Assessment and Plan:   70 year old gentleman with the following issues:  1. Lymphocytosis noted in February 2017 and dates back to at least 2014. His most recent white cell count is 14,000 and his lymphocyte percentage is around 70%. The differential diagnosis was discussed with the patient and his wife. This is most  likely represents a lymphoproliferative disorder. The most common condition would be chronic lymphocytic leukemia (CLL). Other lymphoproliferative disorder such as monoclonal B-cell lymphocytosis would be also a consideration. Other conditions such as mantle cell lymphoma, marginal zone lymphoma or other lymphomas with peripheral lymphocytosis a possibility but less likely.  For management purposes, I will obtain peripheral blood flow cytometry confirmed the diagnosis.  The natural course of CLL was discussed today assuming that is to condition we are dealing with. 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 I educated him about the signs of symptoms of progression of disease that will require treatment. These would include cytopenias, fatigue, excessive weight loss, painful adenopathy, or other constitutional symptoms.  For the time being, I have recommended observation and surveillance only and monitor him clinically every 6 months. He develops these symptoms at any point, we will consider treatment.  2. Microcytosis and mild iron deficiency: Hemoglobin is adequate and iron studies in March 2017 within normal range.  3. Follow-up: Will be in 6 months sooner if needed to.

## 2015-12-23 NOTE — Progress Notes (Signed)
Please see consult note.  

## 2015-12-28 LAB — FLOW CYTOMETRY

## 2016-01-25 ENCOUNTER — Encounter: Payer: Self-pay | Admitting: Internal Medicine

## 2016-01-29 ENCOUNTER — Other Ambulatory Visit: Payer: Self-pay | Admitting: Internal Medicine

## 2016-01-29 DIAGNOSIS — E109 Type 1 diabetes mellitus without complications: Secondary | ICD-10-CM

## 2016-01-29 MED ORDER — INSULIN PEN NEEDLE 31G X 8 MM MISC: Status: DC

## 2016-02-14 ENCOUNTER — Other Ambulatory Visit: Payer: Self-pay

## 2016-02-14 MED ORDER — DAPAGLIFLOZIN PROPANEDIOL 10 MG PO TABS: 10.0000 mg | ORAL_TABLET | Freq: Every day | ORAL | Status: DC

## 2016-02-23 ENCOUNTER — Ambulatory Visit (INDEPENDENT_AMBULATORY_CARE_PROVIDER_SITE_OTHER): Payer: Medicare Other | Admitting: Internal Medicine

## 2016-02-23 ENCOUNTER — Encounter: Payer: Self-pay | Admitting: Internal Medicine

## 2016-02-23 VITALS — BP 116/72 | HR 64 | Temp 97.3°F | Resp 16 | Ht 68.0 in | Wt 208.4 lb

## 2016-02-23 DIAGNOSIS — N182 Chronic kidney disease, stage 2 (mild): Secondary | ICD-10-CM

## 2016-02-23 DIAGNOSIS — I10 Essential (primary) hypertension: Secondary | ICD-10-CM

## 2016-02-23 DIAGNOSIS — Z0000 Encounter for general adult medical examination without abnormal findings: Secondary | ICD-10-CM

## 2016-02-23 DIAGNOSIS — E1022 Type 1 diabetes mellitus with diabetic chronic kidney disease: Secondary | ICD-10-CM

## 2016-02-23 DIAGNOSIS — Z1212 Encounter for screening for malignant neoplasm of rectum: Secondary | ICD-10-CM

## 2016-02-23 DIAGNOSIS — E782 Mixed hyperlipidemia: Secondary | ICD-10-CM

## 2016-02-23 DIAGNOSIS — Z136 Encounter for screening for cardiovascular disorders: Secondary | ICD-10-CM

## 2016-02-23 DIAGNOSIS — E785 Hyperlipidemia, unspecified: Secondary | ICD-10-CM

## 2016-02-23 DIAGNOSIS — E1169 Type 2 diabetes mellitus with other specified complication: Secondary | ICD-10-CM | POA: Insufficient documentation

## 2016-02-23 DIAGNOSIS — E559 Vitamin D deficiency, unspecified: Secondary | ICD-10-CM

## 2016-02-23 DIAGNOSIS — Z79899 Other long term (current) drug therapy: Secondary | ICD-10-CM

## 2016-02-23 DIAGNOSIS — Z0001 Encounter for general adult medical examination with abnormal findings: Secondary | ICD-10-CM

## 2016-02-23 DIAGNOSIS — E1122 Type 2 diabetes mellitus with diabetic chronic kidney disease: Secondary | ICD-10-CM

## 2016-02-23 DIAGNOSIS — Z125 Encounter for screening for malignant neoplasm of prostate: Secondary | ICD-10-CM

## 2016-02-23 LAB — HEPATIC FUNCTION PANEL
ALT: 31 U/L (ref 9–46)
AST: 29 U/L (ref 10–35)
Albumin: 4.2 g/dL (ref 3.6–5.1)
Alkaline Phosphatase: 55 U/L (ref 40–115)
Bilirubin, Direct: 0.2 mg/dL (ref <=0.2)
Indirect Bilirubin: 0.7 mg/dL (ref 0.2–1.2)
Total Bilirubin: 0.9 mg/dL (ref 0.2–1.2)
Total Protein: 7.5 g/dL (ref 6.1–8.1)

## 2016-02-23 LAB — BASIC METABOLIC PANEL WITH GFR
BUN: 17 mg/dL (ref 7–25)
CO2: 25 mmol/L (ref 20–31)
Calcium: 9 mg/dL (ref 8.6–10.3)
Chloride: 101 mmol/L (ref 98–110)
Creat: 0.99 mg/dL (ref 0.70–1.18)
GFR, Est African American: 89 mL/min (ref >=60)
GFR, Est Non African American: 77 mL/min (ref >=60)
Glucose, Bld: 88 mg/dL (ref 65–99)
Potassium: 4.6 mmol/L (ref 3.5–5.3)
Sodium: 136 mmol/L (ref 135–146)

## 2016-02-23 LAB — CBC WITH DIFFERENTIAL/PLATELET
Basophils Absolute: 0 cells/uL (ref 0–200)
Basophils Relative: 0 %
Eosinophils Absolute: 328 cells/uL (ref 15–500)
Eosinophils Relative: 2 %
HCT: 39.4 % (ref 38.5–50.0)
Hemoglobin: 12.5 g/dL — ABNORMAL LOW (ref 13.2–17.1)
Lymphocytes Relative: 79 %
Lymphs Abs: 12956 cells/uL — ABNORMAL HIGH (ref 850–3900)
MCH: 23 pg — ABNORMAL LOW (ref 27.0–33.0)
MCHC: 31.7 g/dL — ABNORMAL LOW (ref 32.0–36.0)
MCV: 72.6 fL — ABNORMAL LOW (ref 80.0–100.0)
MPV: 9.4 fL (ref 7.5–12.5)
Monocytes Absolute: 656 cells/uL (ref 200–950)
Monocytes Relative: 4 %
Neutro Abs: 2460 cells/uL (ref 1500–7800)
Neutrophils Relative %: 15 %
Platelets: 244 10*3/uL (ref 140–400)
RBC: 5.43 MIL/uL (ref 4.20–5.80)
RDW: 16.5 % — ABNORMAL HIGH (ref 11.0–15.0)
WBC: 16.4 10*3/uL — ABNORMAL HIGH (ref 3.8–10.8)

## 2016-02-23 LAB — LIPID PANEL
Cholesterol: 112 mg/dL — ABNORMAL LOW (ref 125–200)
HDL: 32 mg/dL — ABNORMAL LOW (ref >=40)
LDL Cholesterol: 63 mg/dL (ref <130)
Total CHOL/HDL Ratio: 3.5 Ratio (ref <=5.0)
Triglycerides: 83 mg/dL (ref <150)
VLDL: 17 mg/dL (ref <30)

## 2016-02-23 LAB — HEMOGLOBIN A1C
Hgb A1c MFr Bld: 8.4 % — ABNORMAL HIGH (ref <5.7)
Mean Plasma Glucose: 194 mg/dL

## 2016-02-23 LAB — TSH: TSH: 0.82 mIU/L (ref 0.40–4.50)

## 2016-02-23 LAB — MAGNESIUM: Magnesium: 2 mg/dL (ref 1.5–2.5)

## 2016-02-23 NOTE — Patient Instructions (Signed)

## 2016-02-23 NOTE — Progress Notes (Signed)
Patient ID: Nathan Davis, male   DOB: April 26, 1946, 70 y.o.   MRN: [id number]  [facility]   [name], M.D.    Uvaldo Bristle. Silverio Lay, P.A.-C      Starlyn Skeans, P.A.-C   [facility]                [address] [place] [id number] Telephone [phone] Telefax [phone] _________________________________  Annual  Screening/Preventative Visit And Comprehensive Evaluation & Examination     This very nice 70 y.o.MBM presents for a Wellness/Preventative Visit & comprehensive evaluation and management of multiple medical co-morbidities.  Patient has been followed for HTN, Insulin requiring T2_DM, Hyperlipidemia and Vitamin D Deficiency.     HTN predates since 2011. Patient's BP has been controlled at home.Today's BP: 116/72 mmHg. 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 He had a negative stress test in 2011. Patient denies any cardiac symptoms as chest pain, palpitations, shortness of breath, dizziness or ankle swelling.     Patient's hyperlipidemia is controlled with diet and medications. Patient denies myalgias or other medication SE's. Last lipids were at goal with Cholesterol 156; HDL 33*; LDL 105; Triglycerides 90 on 11/15/2015.     Patient has Morbid Obesity (BMI 31+) and consequent Insulin requiring T2_DM since 1992 and patient denies reactive hypoglycemic symptoms, visual blurring, diabetic polys or paresthesias. He alleges CBG's range 125-165 mg%.  Last A1c was not at goal with A1c  8.9% on 11/15/2015.     Finally, patient has history of Vitamin D Deficiency of "34" in 2015  and last vitamin D was still very low at "55" on 11/15/2015.  Medication Sig  . ALLERGY RELIEF 10 MG TAKE ONE TAB  DAILY  . aspirin 81 MG  Take  daily.  Marland Kitchen atenolol 25 MG  TAKE ONE TAB DAILY AT BEDTIME  . atorvastatin 80 MG  TAKE ONE TAB DAILY  . VITAMIN D 2000 UNITS  Take 2,000 Units by mouth daily.   Marland Kitchen FARXIGA Not Taking due to high co-pay  . LEVEMIR FLEXPEN  INJECT 40-50 UNITS SQ  DAILY AS DIRECTED  . metFORMIN 1000 MG TAKE 1 TAB 2  TIMES DAILY.   Past Medical History  Diagnosis Date  . Premature beats, unspecified   . Unspecified essential hypertension   . Other and unspecified hyperlipidemia   . Diabetes ([place]) 1992  . Iron (Fe) deficiency anemia   . Obesity   . Cataract   . Vitamin D deficiency   . Colon polyps   . Femur fracture (HCC)     left  . ED (erectile dysfunction)    Health Maintenance  Topic Date Due  . PNA vac Low Risk Adult (2 of 2 - PPSV23) 08/21/2012  . FOOT EXAM  12/07/2015  . URINE MICROALBUMIN  12/07/2015  . INFLUENZA VACCINE  04/17/2016  . HEMOGLOBIN A1C  05/14/2016  . OPHTHALMOLOGY EXAM  01/22/2017  . COLONOSCOPY  06/28/2020  . TETANUS/TDAP  08/28/2023  . ZOSTAVAX  Completed  . Hepatitis C Screening  Completed   Immunization History  Administered Date(s) Administered  . Influenza, High Dose Seasonal PF 07/22/2013  . Influenza-Unspecified 06/25/2012  . PPD Test 08/27/2013  . Pneumococcal Conjugate-13 08/22/2011  . Td 09/17/2002  . Tdap 08/27/2013  . Zoster 09/17/2005   Past Surgical History  Procedure Laterality Date  . Ett - myoview  7'45", stopped due to fatigue, no chest pain. EF55%, no ischemia or infarction  . Hernia repair    . Nissan fundoplication N/A [id number]   Family History  Problem Relation Age of Onset  . Hypertension      family history  . Heart attack  80  . Throat cancer Mother   . Alzheimer's disease Father    Social History   Social History  . Marital Status: Married    Spouse Name: N/A  . Number of Children: N/A  . Years of Education: N/A   Occupational History  . Not on file.   Social History Main Topics  . Smoking status: Never Smoker   . Smokeless tobacco: Never Used  . Alcohol Use: No  . Drug Use: No  . Sexual Activity: Not on file    ROS Constitutional: Denies fever, chills, weight  loss/gain, headaches, insomnia,  night sweats or change in appetite. Does c/o fatigue. Eyes: Denies redness, blurred vision, diplopia, discharge, itchy or watery eyes.  ENT: Denies discharge, congestion, post nasal drip, epistaxis, sore throat, earache, hearing loss, dental pain, Tinnitus, Vertigo, Sinus pain or snoring.  Cardio: Denies chest pain, palpitations, irregular heartbeat, syncope, dyspnea, diaphoresis, orthopnea, PND, claudication or edema Respiratory: denies cough, dyspnea, DOE, pleurisy, hoarseness, laryngitis or wheezing.  Gastrointestinal: Denies dysphagia, heartburn, reflux, water brash, pain, cramps, nausea, vomiting, bloating, diarrhea, constipation, hematemesis, melena, hematochezia, jaundice or hemorrhoids Genitourinary: Denies dysuria, frequency, urgency, nocturia, hesitancy, discharge, hematuria or flank pain Musculoskeletal: Denies arthralgia, myalgia, stiffness, Jt. Swelling, pain, limp or strain/sprain. Denies Falls. Skin: Denies puritis, rash, hives, warts, acne, eczema or change in skin lesion Neuro: No weakness, tremor, incoordination, spasms, paresthesia or pain Psychiatric: Denies confusion, memory loss or sensory loss. Denies Depression. Endocrine: Denies change in weight, skin, hair change, nocturia, and paresthesia, diabetic polys, visual blurring or hyper / hypo glycemic episodes.  Heme/Lymph: No excessive bleeding, bruising or enlarged lymph nodes.  Physical Exam  BP 116/72 mmHg  Pulse 64  Temp(Src) 97.3 F (36.3 C)  Resp 16  Ht 5\' 8"  (1.727 m)  Wt 208 lb 6.4 oz (94.53 kg)  BMI 31.69 kg/m2  General Appearance: Well nourished, in no apparent distress.  Eyes: PERRLA, EOMs, conjunctiva no swelling or erythema, normal fundi and vessels. Sinuses: No frontal/maxillary tenderness ENT/Mouth: EACs patent / TMs  nl. Nares clear without erythema, swelling, mucoid exudates. Oral hygiene is good. No erythema, swelling, or exudate. Tongue normal, non-obstructing.  Tonsils not swollen or erythematous. Hearing normal.  Neck: Supple, thyroid normal. No bruits, nodes or JVD. Respiratory: Respiratory effort normal.  BS equal and clear bilateral without rales, rhonci, wheezing or stridor. Cardio: Heart sounds are normal with regular rate and rhythm and no murmurs, rubs or gallops. Peripheral pulses are normal and equal bilaterally without edema. No aortic or femoral bruits. Chest: symmetric with normal excursions and percussion.  Abdomen: Soft, with Nl bowel sounds. Nontender, no guarding, rebound, hernias, masses, or organomegaly.  Lymphatics: Non tender without lymphadenopathy.  Genitourinary: No hernias.Testes nl. DRE - prostate nl for age - smooth & firm w/o nodules. Musculoskeletal: Full ROM all peripheral extremities, joint stability, 5/5 strength, and normal gait. Skin: Warm and dry without rashes, lesions, cyanosis, clubbing or  ecchymosis.  Neuro: Cranial nerves intact, reflexes equal bilaterally. Normal muscle tone, no cerebellar symptoms. Sensation intact by touch, vibratory & Monofilament to the toes bilaterally. Marland Kitchen  Pysch: Alert and oriented X 3 with normal affect, insight and judgment appropriate.   Assessment and Plan  1.  Annual Preventative/Screening Exam    2. Essential hypertension  - EKG 12-Lead - [place], RETROPERITNL ABD,  LTD - TSH  3. Mixed hyperlipidemia  - Lipid panel - TSH  4. CKD stage 2 due to type 2 diabetes mellitus (HCC)  - Microalbumin / creatinine urine ratio - HM DIABETES FOOT EXAM - LOW EXTREMITY NEUR EXAM DOCUM - Hemoglobin A1c - Insulin, random  5. Vitamin D deficiency  - VITAMIN D 25 Hydroxy   6. Controlled type 1 diabetes mellitus with stage 2 chronic kidney disease ([place])   7. Screening for rectal cancer  - POC Hemoccult Bld/Stl   8. Prostate cancer screening  - PSA  9. Medication management  - Urinalysis, Routine w reflex microscopic - CBC with Differential/Platelet - BASIC METABOLIC PANEL  WITH GFR - Hepatic function panel - Magnesium   Continue prudent diet as discussed, weight control, BP monitoring, regular exercise, and medications as discussed.  Discussed med effects and SE's. Routine screening labs and tests as requested with regular follow-up as recommended. Over 40 minutes of exam, counseling, chart review and high complex critical decision making was performed

## 2016-02-24 ENCOUNTER — Encounter: Payer: Self-pay | Admitting: Internal Medicine

## 2016-02-24 LAB — URINALYSIS, ROUTINE W REFLEX MICROSCOPIC
Bilirubin Urine: NEGATIVE
Hgb urine dipstick: NEGATIVE
Ketones, ur: NEGATIVE
Leukocytes, UA: NEGATIVE
Nitrite: NEGATIVE
Protein, ur: NEGATIVE
Specific Gravity, Urine: 1.031 (ref 1.001–1.035)
pH: 5.5 (ref 5.0–8.0)

## 2016-02-24 LAB — PSA: PSA: 0.71 ng/mL (ref <=4.00)

## 2016-02-24 LAB — URINALYSIS, MICROSCOPIC ONLY
Bacteria, UA: NONE SEEN [HPF]
Casts: NONE SEEN [LPF]
Crystals: NONE SEEN [HPF]
RBC / HPF: NONE SEEN RBC/HPF (ref <=2)
Squamous Epithelial / HPF: NONE SEEN [HPF] (ref <=5)
WBC, UA: NONE SEEN WBC/HPF (ref <=5)
Yeast: NONE SEEN [HPF]

## 2016-02-24 LAB — VITAMIN D 25 HYDROXY (VIT D DEFICIENCY, FRACTURES): Vit D, 25-Hydroxy: 46 ng/mL (ref 30–100)

## 2016-02-24 LAB — MICROALBUMIN / CREATININE URINE RATIO
Creatinine, Urine: 126 mg/dL (ref 20–370)
Microalb Creat Ratio: 6 mcg/mg creat (ref <30)
Microalb, Ur: 0.7 mg/dL

## 2016-02-24 LAB — INSULIN, RANDOM: Insulin: 16.6 u[IU]/mL (ref 2.0–19.6)

## 2016-02-25 ENCOUNTER — Encounter: Payer: Self-pay | Admitting: *Deleted

## 2016-03-27 ENCOUNTER — Other Ambulatory Visit: Payer: Self-pay

## 2016-03-27 MED ORDER — DAPAGLIFLOZIN PROPANEDIOL 10 MG PO TABS: 10.0000 mg | ORAL_TABLET | Freq: Every day | ORAL | Status: DC

## 2016-05-30 ENCOUNTER — Ambulatory Visit (INDEPENDENT_AMBULATORY_CARE_PROVIDER_SITE_OTHER): Payer: Medicare Other | Admitting: Internal Medicine

## 2016-05-30 ENCOUNTER — Encounter: Payer: Self-pay | Admitting: Internal Medicine

## 2016-05-30 VITALS — BP 126/62 | HR 64 | Temp 98.0°F | Resp 16 | Ht 68.0 in | Wt 208.0 lb

## 2016-05-30 DIAGNOSIS — E1122 Type 2 diabetes mellitus with diabetic chronic kidney disease: Secondary | ICD-10-CM

## 2016-05-30 DIAGNOSIS — I10 Essential (primary) hypertension: Secondary | ICD-10-CM | POA: Diagnosis not present

## 2016-05-30 DIAGNOSIS — E785 Hyperlipidemia, unspecified: Secondary | ICD-10-CM

## 2016-05-30 DIAGNOSIS — Z79899 Other long term (current) drug therapy: Secondary | ICD-10-CM

## 2016-05-30 DIAGNOSIS — E559 Vitamin D deficiency, unspecified: Secondary | ICD-10-CM

## 2016-05-30 DIAGNOSIS — N182 Chronic kidney disease, stage 2 (mild): Secondary | ICD-10-CM

## 2016-05-30 DIAGNOSIS — Z23 Encounter for immunization: Secondary | ICD-10-CM | POA: Diagnosis not present

## 2016-05-30 LAB — BASIC METABOLIC PANEL WITH GFR
BUN: 17 mg/dL (ref 7–25)
CO2: 28 mmol/L (ref 20–31)
Calcium: 9.6 mg/dL (ref 8.6–10.3)
Chloride: 102 mmol/L (ref 98–110)
Creat: 1.08 mg/dL (ref 0.70–1.18)
GFR, Est African American: 80 mL/min (ref >=60)
GFR, Est Non African American: 69 mL/min (ref >=60)
Glucose, Bld: 176 mg/dL — ABNORMAL HIGH (ref 65–99)
Potassium: 5 mmol/L (ref 3.5–5.3)
Sodium: 134 mmol/L — ABNORMAL LOW (ref 135–146)

## 2016-05-30 LAB — HEPATIC FUNCTION PANEL
ALT: 15 U/L (ref 9–46)
AST: 16 U/L (ref 10–35)
Albumin: 4.2 g/dL (ref 3.6–5.1)
Alkaline Phosphatase: 78 U/L (ref 40–115)
Bilirubin, Direct: 0.2 mg/dL (ref <=0.2)
Indirect Bilirubin: 0.5 mg/dL (ref 0.2–1.2)
Total Bilirubin: 0.7 mg/dL (ref 0.2–1.2)
Total Protein: 7.5 g/dL (ref 6.1–8.1)

## 2016-05-30 LAB — LIPID PANEL
Cholesterol: 156 mg/dL (ref 125–200)
HDL: 38 mg/dL — ABNORMAL LOW (ref >=40)
LDL Cholesterol: 100 mg/dL (ref <130)
Total CHOL/HDL Ratio: 4.1 Ratio (ref <=5.0)
Triglycerides: 92 mg/dL (ref <150)
VLDL: 18 mg/dL (ref <30)

## 2016-05-30 NOTE — Progress Notes (Signed)
Assessment and Plan:  Hypertension:  -Continue medication,  -monitor blood pressure at home.  -Continue DASH diet.   -Reminder to go to the ER if any CP, SOB, nausea, dizziness, severe HA, changes vision/speech, left arm numbness and tingling, and jaw pain.  Cholesterol: -Continue diet and exercise.  -Check cholesterol.   Diabetes: -consider increasing levemir if not good A1C -Continue diet and exercise.  -Check A1C  Vitamin D Def: -check level -continue medications.   Continue diet and meds as discussed. Further disposition pending results of labs.  HPI 70 y.o. male  presents for 3 month follow up with hypertension, hyperlipidemia, prediabetes and vitamin D.   His blood pressure has been controlled at home, today their BP is BP: 126/62.   He does workout. He denies chest pain, shortness of breath, dizziness.   He is on cholesterol medication and denies myalgias. His cholesterol is at goal. The cholesterol last visit was:   Lab Results  Component Value Date   CHOL 112 (L) 02/23/2016   HDL 32 (L) 02/23/2016   LDLCALC 63 02/23/2016   TRIG 83 02/23/2016   CHOLHDL 3.5 02/23/2016     He has been working on diet and exercise for prediabetes, and denies foot ulcerations, hyperglycemia, hypoglycemia , increased appetite, nausea, paresthesia of the feet, polydipsia, polyuria, visual disturbances, vomiting and weight loss. Last A1C in the office was:  Lab Results  Component Value Date   HGBA1C 8.4 (H) 02/23/2016  He reports that after coffee he is around 160.  He reports that there is no hypoglycemic episodes.    Patient is on Vitamin D supplement.  Lab Results  Component Value Date   VD25OH 46 02/23/2016      Current Medications:  Current Outpatient Prescriptions on File Prior to Visit  Medication Sig Dispense Refill  . ALLERGY RELIEF 10 MG tablet TAKE ONE TABLET BY MOUTH DAILY 90 tablet 4  . aspirin 81 MG tablet Take 81 mg by mouth daily.    Marland Kitchen atenolol (TENORMIN) 25 MG  tablet TAKE ONE TABLET BY MOUTH DAILY AT BEDTIME 90 tablet 0  . atorvastatin (LIPITOR) 80 MG tablet TAKE ONE TABLET BY MOUTH DAILY 90 tablet 0  . Cholecalciferol (VITAMIN D) 2000 UNITS tablet Take 2,000 Units by mouth daily.    . dapagliflozin propanediol (FARXIGA) 10 MG TABS tablet Take 10 mg by mouth daily. 28 tablet 0  . Insulin Pen Needle 31G X 8 MM MISC Check BS twice daily for fluctuations and medication adjustment 200 each 3  . LEVEMIR FLEXPEN 100 UNIT/ML SOPN INJECT 40-50 UNITS SUBCUTANEOUSLY DAILY AS DIRECTED 5 pen 5  . metFORMIN (GLUCOPHAGE) 1000 MG tablet TAKE 1 TABLET (1,000 MG TOTAL) BY MOUTH 2 (TWO) TIMES DAILY. 60 tablet 5   No current facility-administered medications on file prior to visit.     Medical History:  Past Medical History:  Diagnosis Date  . Cataract   . Colon polyps   . Diabetes ([place]) 1992  . ED (erectile dysfunction)   . Femur fracture (HCC)    left  . Iron (Fe) deficiency anemia   . Obesity   . Other and unspecified hyperlipidemia   . Premature beats, unspecified   . Unspecified essential hypertension   . Vitamin D deficiency     Allergies:  Allergies  Allergen Reactions  . Amaryl [Glimepiride] Diarrhea  . Codeine Itching  . Pioglitazone Diarrhea, Itching and Anxiety     Review of Systems:  Review of Systems  Constitutional: Negative for  chills, fever and malaise/fatigue.  HENT: Negative for congestion, ear pain and sore throat.   Eyes: Negative.   Respiratory: Negative for cough, shortness of breath and wheezing.   Cardiovascular: Negative for chest pain, palpitations and leg swelling.  Gastrointestinal: Negative for abdominal pain, blood in stool, constipation, diarrhea, heartburn and melena.  Genitourinary: Negative.   Skin: Negative.   Neurological: Negative for dizziness, sensory change, loss of consciousness and headaches.  Psychiatric/Behavioral: Negative for depression. The patient is not nervous/anxious and does not have insomnia.      Family history- Review and unchanged  Social history- Review and unchanged  Physical Exam: BP 126/62   Pulse 64   Temp 98 F (36.7 C) (Temporal)   Resp 16   Ht 5\' 8"  (1.727 m)   Wt 208 lb (94.3 kg)   BMI 31.63 kg/m  Wt Readings from Last 3 Encounters:  05/30/16 208 lb (94.3 kg)  02/23/16 208 lb 6.4 oz (94.5 kg)  12/23/15 211 lb (95.7 kg)    General Appearance: Well nourished well developed, in no apparent distress. Eyes: PERRLA, EOMs, conjunctiva no swelling or erythema ENT/Mouth: Ear canals normal without obstruction, swelling, erythma, discharge.  TMs normal bilaterally.  Oropharynx moist, clear, without exudate, or postoropharyngeal swelling. Neck: Supple, thyroid normal,no cervical adenopathy  Respiratory: Respiratory effort normal, Breath sounds clear A&P without rhonchi, wheeze, or rale.  No retractions, no accessory usage. Cardio: RRR with no MRGs. Brisk peripheral pulses without edema.  Abdomen: Soft, + BS,  Non tender, no guarding, rebound, hernias, masses. Musculoskeletal: Full ROM, 5/5 strength, Normal gait Skin: Warm, dry without rashes, lesions, ecchymosis.  Neuro: Awake and oriented X 3, Cranial nerves intact. Normal muscle tone, no cerebellar symptoms. Psych: Normal affect, Insight and Judgment appropriate.    Starlyn Skeans, PA-C 9:07 AM [facility]

## 2016-05-31 LAB — CBC WITH DIFFERENTIAL/PLATELET
Basophils Absolute: 219 cells/uL — ABNORMAL HIGH (ref 0–200)
Basophils Relative: 1 %
Eosinophils Absolute: 438 cells/uL (ref 15–500)
Eosinophils Relative: 2 %
HCT: 40.8 % (ref 38.5–50.0)
Hemoglobin: 13.1 g/dL — ABNORMAL LOW (ref 13.2–17.1)
Lymphocytes Relative: 84 %
Lymphs Abs: 18396 cells/uL — ABNORMAL HIGH (ref 850–3900)
MCH: 23.1 pg — ABNORMAL LOW (ref 27.0–33.0)
MCHC: 32.1 g/dL (ref 32.0–36.0)
MCV: 72.1 fL — ABNORMAL LOW (ref 80.0–100.0)
MPV: 10 fL (ref 7.5–12.5)
Monocytes Absolute: 657 cells/uL (ref 200–950)
Monocytes Relative: 3 %
Neutro Abs: 2190 cells/uL (ref 1500–7800)
Neutrophils Relative %: 10 %
Platelets: 240 10*3/uL (ref 140–400)
RBC: 5.66 MIL/uL (ref 4.20–5.80)
RDW: 16.6 % — ABNORMAL HIGH (ref 11.0–15.0)
WBC: 21.9 10*3/uL — ABNORMAL HIGH (ref 3.8–10.8)

## 2016-05-31 LAB — HEMOGLOBIN A1C
Hgb A1c MFr Bld: 9.2 % — ABNORMAL HIGH (ref <5.7)
Mean Plasma Glucose: 217 mg/dL

## 2016-05-31 LAB — TSH: TSH: 0.82 mIU/L (ref 0.40–4.50)

## 2016-06-01 ENCOUNTER — Other Ambulatory Visit: Payer: Self-pay | Admitting: *Deleted

## 2016-06-01 MED ORDER — LORATADINE 10 MG PO TABS: 10.0000 mg | ORAL_TABLET | Freq: Every day | ORAL | 2 refills | Status: DC

## 2016-06-20 ENCOUNTER — Telehealth: Payer: Self-pay | Admitting: Oncology

## 2016-06-20 NOTE — Telephone Encounter (Signed)
10/06 Appointment rescheduled to 10/11 per patient request. The patient has to work and will not be available on 10/06. Pt . Requested an appointment for the following week.

## 2016-06-22 ENCOUNTER — Ambulatory Visit: Payer: Medicare Other | Admitting: Oncology

## 2016-06-22 ENCOUNTER — Other Ambulatory Visit: Payer: Medicare Other

## 2016-06-27 ENCOUNTER — Ambulatory Visit (HOSPITAL_BASED_OUTPATIENT_CLINIC_OR_DEPARTMENT_OTHER): Payer: Medicare Other | Admitting: Oncology

## 2016-06-27 ENCOUNTER — Other Ambulatory Visit (HOSPITAL_BASED_OUTPATIENT_CLINIC_OR_DEPARTMENT_OTHER): Payer: Medicare Other

## 2016-06-27 ENCOUNTER — Other Ambulatory Visit (HOSPITAL_COMMUNITY)
Admission: RE | Admit: 2016-06-27 | Discharge: 2016-06-27 | Disposition: A | Discharge status: Home / Self Care | Payer: Medicare Other | Source: Ambulatory Visit | Attending: Oncology | Admitting: Oncology

## 2016-06-27 ENCOUNTER — Telehealth: Payer: Self-pay | Admitting: Oncology

## 2016-06-27 VITALS — HR 65 | Temp 98.7°F | Resp 18 | Wt 215.8 lb

## 2016-06-27 DIAGNOSIS — D509 Iron deficiency anemia, unspecified: Secondary | ICD-10-CM

## 2016-06-27 DIAGNOSIS — C9110 Chronic lymphocytic leukemia of B-cell type not having achieved remission: Secondary | ICD-10-CM | POA: Diagnosis not present

## 2016-06-27 DIAGNOSIS — D72820 Lymphocytosis (symptomatic): Secondary | ICD-10-CM

## 2016-06-27 DIAGNOSIS — E1122 Type 2 diabetes mellitus with diabetic chronic kidney disease: Secondary | ICD-10-CM | POA: Insufficient documentation

## 2016-06-27 LAB — CBC WITH DIFFERENTIAL/PLATELET
BASO%: 0.6 % (ref 0.0–2.0)
Basophils Absolute: 0.1 10*3/uL (ref 0.0–0.1)
EOS%: 2.3 % (ref 0.0–7.0)
Eosinophils Absolute: 0.5 10*3/uL (ref 0.0–0.5)
HCT: 37.6 % — ABNORMAL LOW (ref 38.4–49.9)
HGB: 11.6 g/dL — ABNORMAL LOW (ref 13.0–17.1)
LYMPH%: 81.4 % — ABNORMAL HIGH (ref 14.0–49.0)
MCH: 21.9 pg — ABNORMAL LOW (ref 27.2–33.4)
MCHC: 30.8 g/dL — ABNORMAL LOW (ref 32.0–36.0)
MCV: 71.3 fL — ABNORMAL LOW (ref 79.3–98.0)
MONO#: 0.7 10*3/uL (ref 0.1–0.9)
MONO%: 3.4 % (ref 0.0–14.0)
NEUT#: 2.5 10*3/uL (ref 1.5–6.5)
NEUT%: 12.3 % — ABNORMAL LOW (ref 39.0–75.0)
Platelets: 208 10*3/uL (ref 140–400)
RBC: 5.28 10*6/uL (ref 4.20–5.82)
RDW: 15.9 % — ABNORMAL HIGH (ref 11.0–14.6)
WBC: 20.6 10*3/uL — ABNORMAL HIGH (ref 4.0–10.3)
lymph#: 16.7 10*3/uL — ABNORMAL HIGH (ref 0.9–3.3)

## 2016-06-27 LAB — COMPREHENSIVE METABOLIC PANEL
ALT: 17 U/L (ref 0–55)
AST: 16 U/L (ref 5–34)
Albumin: 3.7 g/dL (ref 3.5–5.0)
Alkaline Phosphatase: 94 U/L (ref 40–150)
Anion Gap: 7 mEq/L (ref 3–11)
BUN: 18.7 mg/dL (ref 7.0–26.0)
CO2: 27 mEq/L (ref 22–29)
Calcium: 9 mg/dL (ref 8.4–10.4)
Chloride: 104 mEq/L (ref 98–109)
Creatinine: 1.2 mg/dL (ref 0.7–1.3)
EGFR: 72 mL/min/{1.73_m2} — ABNORMAL LOW (ref >90)
Glucose: 222 mg/dl — ABNORMAL HIGH (ref 70–140)
Potassium: 4.7 mEq/L (ref 3.5–5.1)
Sodium: 138 mEq/L (ref 136–145)
Total Bilirubin: 0.65 mg/dL (ref 0.20–1.20)
Total Protein: 7.3 g/dL (ref 6.4–8.3)

## 2016-06-27 LAB — TECHNOLOGIST REVIEW

## 2016-06-27 NOTE — Progress Notes (Signed)
Hematology and Oncology Follow Up Visit  Nathan Davis [id number] [birth year] 70 y.o. 06/27/2016 12:58 PM Nathan Davis, MDMcKeown, Nathan Saxon, MD   Principle Diagnosis: 70 year old gentleman with stage 0 CLL presented with lymphocytosis. His flow cytometry in April 2017 confirmed this diagnosis. He has also element of iron deficiency anemia.   Current therapy: Observation and surveillance for his CLL. Oral iron supplements for his iron deficiency.  Interim History:  Nathan Davis presents today for a follow-up visit. Pleasant gentleman with leukocytosis and lymphocytosis. His peripheral blood flow cytometry in April 2017 confirmed the presence of CLL. He is asymptomatic at this time and has not reported any complaints since last visit. He denied any painful adenopathy, fevers, chills or constitutional symptoms. He remains very active and works part-time. He remains on oral iron at this time and has tolerated reasonably well. He denied any hematochezia or melena. He is up-to-date on his colonoscopies.  He does not report any headaches, blurry vision, syncope or seizures. He does not report any fevers, chills or sweats. Does not report any chest pain, palpitation, orthopnea or leg edema. He does not report any cough, wheezing or hemoptysis. Does not report any nausea, vomiting or abdominal pain. Does not report any constipation, diarrhea, hematochezia or melena. He does not report any frequency urgency or hesitancy. He does not report any skeletal complaints. Remaining review of systems unremarkable.   Medications: I have reviewed the patient's current medications.  Current Outpatient Prescriptions  Medication Sig Dispense Refill  . ALLERGY RELIEF 10 MG tablet TAKE ONE TABLET BY MOUTH DAILY 90 tablet 4  . aspirin 81 MG tablet Take 81 mg by mouth daily.    Marland Kitchen atenolol (TENORMIN) 25 MG tablet TAKE ONE TABLET BY MOUTH DAILY AT BEDTIME 90 tablet 0  . atorvastatin (LIPITOR) 80 MG tablet  TAKE ONE TABLET BY MOUTH DAILY 90 tablet 0  . Cholecalciferol (VITAMIN D) 2000 UNITS tablet Take 2,000 Units by mouth daily.    . dapagliflozin propanediol (FARXIGA) 10 MG TABS tablet Take 10 mg by mouth daily. 28 tablet 0  . Insulin Pen Needle 31G X 8 MM MISC Check BS twice daily for fluctuations and medication adjustment 200 each 3  . LEVEMIR FLEXPEN 100 UNIT/ML SOPN INJECT 40-50 UNITS SUBCUTANEOUSLY DAILY AS DIRECTED 5 pen 5  . loratadine (CLARITIN) 10 MG tablet Take 1 tablet (10 mg total) by mouth daily. 30 tablet 2  . metFORMIN (GLUCOPHAGE) 1000 MG tablet TAKE 1 TABLET (1,000 MG TOTAL) BY MOUTH 2 (TWO) TIMES DAILY. 60 tablet 5   No current facility-administered medications for this visit.      Allergies:  Allergies  Allergen Reactions  . Amaryl [Glimepiride] Diarrhea  . Codeine Itching  . Pioglitazone Diarrhea, Itching and Anxiety    Past Medical History, Surgical history, Social history, and Family History were reviewed and updated.   Physical Exam: Pulse 65, temperature 98.7 F (37.1 C), temperature source Oral, resp. rate 18, weight 215 lb 12.8 oz (97.9 kg), SpO2 98 %. ECOG: 0 General appearance: alert and cooperative Head: Normocephalic, without obvious abnormality Neck: no adenopathy Lymph nodes: Cervical, supraclavicular, and axillary nodes normal. Heart:regular rate and rhythm, S1, S2 normal, no murmur, click, rub or gallop Lung:chest clear, no wheezing, rales, normal symmetric air entry, Heart exam - S1, S2 normal, no murmur, no gallop, rate regular Abdomin: soft, non-tender, without masses or organomegaly EXT:no erythema, induration, or nodules   Lab Results: Lab Results  Component Value Date   WBC 20.6 (H) 06/27/2016  HGB 11.6 (L) 06/27/2016   HCT 37.6 (L) 06/27/2016   MCV 71.3 (L) 06/27/2016   PLT 208 06/27/2016     Chemistry      Component Value Date/Time   NA 134 (L) 05/30/2016 0959   NA 139 12/23/2015 1443   K 5.0 05/30/2016 0959   K 4.7  12/23/2015 1443   CL 102 05/30/2016 0959   CO2 28 05/30/2016 0959   CO2 26 12/23/2015 1443   BUN 17 05/30/2016 0959   BUN 13.9 12/23/2015 1443   CREATININE 1.08 05/30/2016 0959   CREATININE 1.0 12/23/2015 1443      Component Value Date/Time   CALCIUM 9.6 05/30/2016 0959   CALCIUM 9.3 12/23/2015 1443   ALKPHOS 78 05/30/2016 0959   ALKPHOS 64 12/23/2015 1443   AST 16 05/30/2016 0959   AST 21 12/23/2015 1443   ALT 15 05/30/2016 0959   ALT 16 12/23/2015 1443   BILITOT 0.7 05/30/2016 0959   BILITOT 0.59 12/23/2015 1443       Impression and Plan:   70 year old gentleman with the following issues:  1. CLL diagnosed in April 2017. He presented with lymphocytosis without any lymphadenopathy or symptoms. His laboratory data and physical examination today did not show any major changes or progression of disease.  The natural course of this disease was reviewed again and indication for treatment were discussed. These would include progressive cytopenias, autoimmune concerns, painful adenopathy, constitutional symptoms among others. He is clearly asymptomatic at this time and I recommended continued monitoring.  2. Iron deficiency anemia: He is up-to-date on colonoscopies and Hemoccult testing is negative. I agree with oral iron and I will recheck his iron stores and 6 months intravenous iron will be considered if he is not responsive to oral iron.  3. Autoimmune considerations: He is at risk of developing autoimmune anemia and thrombocytopenia and no indication for that at this time and we'll continue to monitor.  4. Infectious considerations: He is at risk of infection because of immune dysregulation related to CLL. Continue to educate him about these possibilities and a ways to avoid serious infections. He is immune system appears to be intact at this time I will continue to address this issue moving forward.  5. Follow-up: Will be in 6 months sooner if needed to.   Zola Button,  MD 10/11/201712:58 PM

## 2016-06-27 NOTE — Telephone Encounter (Signed)
Avs report and appointment schedule given to patient, per 06/27/16 los. ° °

## 2016-06-28 LAB — LACTATE DEHYDROGENASE: LDH: 171 U/L (ref 125–245)

## 2016-07-07 ENCOUNTER — Other Ambulatory Visit: Payer: Self-pay | Admitting: Internal Medicine

## 2016-07-07 MED ORDER — METFORMIN HCL 1000 MG PO TABS: ORAL_TABLET | ORAL | 0 refills | Status: DC

## 2016-08-03 ENCOUNTER — Other Ambulatory Visit: Payer: Self-pay | Admitting: Internal Medicine

## 2016-09-05 ENCOUNTER — Ambulatory Visit: Payer: Self-pay | Admitting: Internal Medicine

## 2016-10-03 ENCOUNTER — Ambulatory Visit: Payer: Self-pay | Admitting: Internal Medicine

## 2016-10-09 ENCOUNTER — Ambulatory Visit (INDEPENDENT_AMBULATORY_CARE_PROVIDER_SITE_OTHER): Payer: Medicare Other | Admitting: Internal Medicine

## 2016-10-09 ENCOUNTER — Encounter: Payer: Self-pay | Admitting: Internal Medicine

## 2016-10-09 VITALS — BP 132/76 | HR 72 | Temp 97.5°F | Resp 16 | Ht 68.0 in | Wt 215.6 lb

## 2016-10-09 DIAGNOSIS — C9110 Chronic lymphocytic leukemia of B-cell type not having achieved remission: Secondary | ICD-10-CM | POA: Diagnosis not present

## 2016-10-09 DIAGNOSIS — I10 Essential (primary) hypertension: Secondary | ICD-10-CM

## 2016-10-09 DIAGNOSIS — E782 Mixed hyperlipidemia: Secondary | ICD-10-CM | POA: Diagnosis not present

## 2016-10-09 DIAGNOSIS — N182 Chronic kidney disease, stage 2 (mild): Secondary | ICD-10-CM | POA: Diagnosis not present

## 2016-10-09 DIAGNOSIS — E1122 Type 2 diabetes mellitus with diabetic chronic kidney disease: Secondary | ICD-10-CM

## 2016-10-09 DIAGNOSIS — E559 Vitamin D deficiency, unspecified: Secondary | ICD-10-CM

## 2016-10-09 DIAGNOSIS — Z79899 Other long term (current) drug therapy: Secondary | ICD-10-CM | POA: Diagnosis not present

## 2016-10-09 LAB — CBC WITH DIFFERENTIAL/PLATELET
Basophils Absolute: 297 cells/uL — ABNORMAL HIGH (ref 0–200)
Basophils Relative: 1 %
Eosinophils Absolute: 297 cells/uL (ref 15–500)
Eosinophils Relative: 1 %
HCT: 39.1 % (ref 38.5–50.0)
Hemoglobin: 12.1 g/dL — ABNORMAL LOW (ref 13.2–17.1)
Lymphocytes Relative: 73 %
Lymphs Abs: 21681 cells/uL — ABNORMAL HIGH (ref 850–3900)
MCH: 22.9 pg — ABNORMAL LOW (ref 27.0–33.0)
MCHC: 30.9 g/dL — ABNORMAL LOW (ref 32.0–36.0)
MCV: 74.1 fL — ABNORMAL LOW (ref 80.0–100.0)
MPV: 9.5 fL (ref 7.5–12.5)
Monocytes Absolute: 1782 cells/uL — ABNORMAL HIGH (ref 200–950)
Monocytes Relative: 6 %
Neutro Abs: 5643 cells/uL (ref 1500–7800)
Neutrophils Relative %: 19 % — AB
Platelets: 223 10*3/uL (ref 140–400)
RBC: 5.28 MIL/uL (ref 4.20–5.80)
RDW: 15.9 % — ABNORMAL HIGH (ref 11.0–15.0)
WBC: 29.7 10*3/uL — ABNORMAL HIGH (ref 3.8–10.8)

## 2016-10-09 LAB — TSH: TSH: 0.64 mIU/L (ref 0.40–4.50)

## 2016-10-09 NOTE — Progress Notes (Signed)
[facility] [name], M.D.        Uvaldo Bristle. Silverio Lay, P.A.-C       Starlyn Skeans, P.A.-C  [facility]                [address] [place], [place] [id number] Telephone [phone] Telefax [phone] ______________________________________________________________________     This very nice 71 y.o.  MBM presents for 6 month follow up with Hypertension, Hyperlipidemia, Pre-Diabetes and Vitamin D Deficiency.   Also, patient is followed by Dr Alen Blew for atypical lymphocytosis, prob CLL.      Patient is treated for HTN (2011)  & BP has been controlled at home. Patient had a negative stress test in 2011 and also has hx/o unifocal PVB's. Today's BP is at goal - 132/76. Patient has had no complaints of any cardiac type chest pain, palpitations, dyspnea/orthopnea/PND, dizziness, claudication, or dependent edema.     Hyperlipidemia is controlled with diet & meds. Patient denies myalgias or other med SE's. Last Lipids were at goal: Lab Results  Component Value Date   CHOL 156 05/30/2016   HDL 38 (L) 05/30/2016   LDLCALC 100 05/30/2016   TRIG 92 05/30/2016   CHOLHDL 4.1 05/30/2016      Also, the patient has history of Morbid Obesity (BMI 32+) and Insulin req. [id number]) and has had no symptoms of reactive hypoglycemia, diabetic polys, paresthesias or visual blurring.  He reports CBG's have recently been higher due to indiscriminate gluttony over the recent holidays and CBG's have been <200 mg%. Last A1c was not at goal: Lab Results  Component Value Date   HGBA1C 9.2 (H) 05/30/2016      Further, the patient also has history of Vitamin D Deficiency in 2015 of "34" and he supplements vitamin D without any suspected side-effects. Last vitamin D was not at goal: Lab Results  Component Value Date   VD25OH 46 02/23/2016   Current Outpatient Prescriptions on File Prior to Visit  Medication Sig  . ALLERGY  RELIEF 10 MG tablet TAKE ONE TABLET BY MOUTH DAILY  . aspirin 81 MG tablet Take 81 mg by mouth daily.  Marland Kitchen atenolol (TENORMIN) 25 MG tablet TAKE ONE TABLET BY MOUTH DAILY AT BEDTIME  . atorvastatin (LIPITOR) 80 MG tablet TAKE ONE TABLET BY MOUTH DAILY  . Cholecalciferol (VITAMIN D) 2000 UNITS tablet Take 2,000 Units by mouth daily.  . dapagliflozin propanediol (FARXIGA) 10 MG TABS tablet Take 10 mg by mouth daily.  . Insulin Pen Needle 31G X 8 MM MISC Check BS twice daily for fluctuations and medication adjustment  . LEVEMIR FLEXPEN 100 UNIT/ML SOPN INJECT 40-50 UNITS SUBCUTANEOUSLY DAILY AS DIRECTED  . loratadine (CLARITIN) 10 MG tablet Take 1 tablet (10 mg total) by mouth daily.  . metFORMIN (GLUCOPHAGE) 1000 MG tablet TAKE 1 TABLET (1,000 MG TOTAL) BY MOUTH 2 (TWO) TIMES DAILY.   No current facility-administered medications on file prior to visit.    Allergies  Allergen Reactions  . Amaryl [Glimepiride] Diarrhea  . Codeine Itching  . Pioglitazone Diarrhea, Itching and Anxiety   PMHx:   Past Medical History:  Diagnosis Date  . Cataract   . Colon polyps   . Diabetes ([place]) 1992  . ED (erectile dysfunction)   . Femur fracture (HCC)    left  . Iron (Fe) deficiency anemia   . Obesity   . Other  and unspecified hyperlipidemia   . Premature beats, unspecified   . Unspecified essential hypertension   . Vitamin D deficiency    Immunization History  Administered Date(s) Administered  . Influenza, High Dose Seasonal PF 07/22/2013  . Influenza-Unspecified 06/25/2012  . PPD Test 08/27/2013  . Pneumococcal Conjugate-13 08/22/2011  . Pneumococcal Polysaccharide-23 05/30/2016  . Td 09/17/2002  . Tdap 08/27/2013  . Zoster 09/17/2005   Past Surgical History:  Procedure Laterality Date  . ETT - MYOVIEW     7'45", stopped due to fatigue, no chest pain. EF55%, no ischemia or infarction  . HERNIA REPAIR    . nissan fundoplication N/A [id number]   FHx:    Reviewed / unchanged  SHx:     Reviewed / unchanged  Systems Review:  Constitutional: Denies fever, chills, wt changes, headaches, insomnia, fatigue, night sweats, change in appetite. Eyes: Denies redness, blurred vision, diplopia, discharge, itchy, watery eyes.  ENT: Denies discharge, congestion, post nasal drip, epistaxis, sore throat, earache, hearing loss, dental pain, tinnitus, vertigo, sinus pain, snoring.  CV: Denies chest pain, palpitations, irregular heartbeat, syncope, dyspnea, diaphoresis, orthopnea, PND, claudication or edema. Respiratory: denies cough, dyspnea, DOE, pleurisy, hoarseness, laryngitis, wheezing.  Gastrointestinal: Denies dysphagia, odynophagia, heartburn, reflux, water brash, abdominal pain or cramps, nausea, vomiting, bloating, diarrhea, constipation, hematemesis, melena, hematochezia  or hemorrhoids. Genitourinary: Denies dysuria, frequency, urgency, nocturia, hesitancy, discharge, hematuria or flank pain. Musculoskeletal: Denies arthralgias, myalgias, stiffness, jt. swelling, pain, limping or strain/sprain.  Skin: Denies pruritus, rash, hives, warts, acne, eczema or change in skin lesion(s). Neuro: No weakness, tremor, incoordination, spasms, paresthesia or pain. Psychiatric: Denies confusion, memory loss or sensory loss. Endo: Denies change in weight, skin or hair change.  Heme/Lymph: No excessive bleeding, bruising or enlarged lymph nodes.  Physical Exam  BP 132/76   Pulse 72   Temp 97.5 F (36.4 C)   Resp 16   Ht 5\' 8"  (1.727 m)   Wt 215 lb 9.6 oz (97.8 kg)   BMI 32.78 kg/m   Appears well nourished and in no distress.  Eyes: PERRLA, EOMs, conjunctiva no swelling or erythema. Sinuses: No frontal/maxillary tenderness ENT/Mouth: EAC's clear, TM's nl w/o erythema, bulging. Nares clear w/o erythema, swelling, exudates. Oropharynx clear without erythema or exudates. Oral hygiene is good. Tongue normal, non obstructing. Hearing intact.  Neck: Supple. Thyroid nl. Car 2+/2+ without bruits,  nodes or JVD. Chest: Respirations nl with BS clear & equal w/o rales, rhonchi, wheezing or stridor.  Cor: Heart sounds normal w/ regular rate and rhythm without sig. murmurs, gallops, clicks, or rubs. Peripheral pulses normal and equal  without edema.  Abdomen: Soft & bowel sounds normal. Non-tender w/o guarding, rebound, hernias, masses, or organomegaly.  Lymphatics: Unremarkable.  Musculoskeletal: Full ROM all peripheral extremities, joint stability, 5/5 strength, and normal gait.  Skin: Warm, dry without exposed rashes, lesions or ecchymosis apparent.  Neuro: Cranial nerves intact, reflexes equal bilaterally. Sensory-motor testing grossly intact. Tendon reflexes grossly intact.  Pysch: Alert & oriented x 3.  Insight and judgement nl & appropriate. No ideations.  Assessment and Plan:  1. Essential hypertension  - Continue medication, monitor blood pressure at home.  - Continue DASH diet. Reminder to go to the ER if any CP,  SOB, nausea, dizziness, severe HA, changes vision/speech,  left arm numbness and tingling and jaw pain. - CBC with Differential/Platelet - BASIC METABOLIC PANEL WITH GFR - TSH  2. Mixed hyperlipidemia  - Continue diet/meds, exercise,& lifestyle modifications.  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  - Monitor appropriate labs. - Hemoglobin A1c  4. Vitamin D deficiency  - Continue supplementation. - VITAMIN D 25 Hydroxy   5. Medication management  - CBC with Differential/Platelet - BASIC METABOLIC PANEL WITH GFR - Hepatic function panel - Magnesium - VITAMIN D 25 Hydroxy   6. CLL (chronic lymphocytic leukemia) ([place])       Recommended regular exercise, BP monitoring, weight control, and discussed med and SE's. Recommended labs to assess and monitor clinical status.  Further disposition pending results of labs. Over 30 minutes of exam, counseling, chart review was performed

## 2016-10-09 NOTE — Patient Instructions (Signed)

## 2016-10-10 LAB — HEPATIC FUNCTION PANEL
ALT: 19 U/L (ref 9–46)
AST: 20 U/L (ref 10–35)
Albumin: 4.2 g/dL (ref 3.6–5.1)
Alkaline Phosphatase: 61 U/L (ref 40–115)
Bilirubin, Direct: 0.2 mg/dL (ref <=0.2)
Indirect Bilirubin: 0.5 mg/dL (ref 0.2–1.2)
Total Bilirubin: 0.7 mg/dL (ref 0.2–1.2)
Total Protein: 7.5 g/dL (ref 6.1–8.1)

## 2016-10-10 LAB — HEMOGLOBIN A1C
Hgb A1c MFr Bld: 9.8 % — ABNORMAL HIGH (ref <5.7)
Mean Plasma Glucose: 235 mg/dL

## 2016-10-10 LAB — BASIC METABOLIC PANEL WITH GFR
BUN: 22 mg/dL (ref 7–25)
CO2: 22 mmol/L (ref 20–31)
Calcium: 9.5 mg/dL (ref 8.6–10.3)
Chloride: 101 mmol/L (ref 98–110)
Creat: 1.22 mg/dL — ABNORMAL HIGH (ref 0.70–1.18)
GFR, Est African American: 69 mL/min (ref >=60)
GFR, Est Non African American: 59 mL/min — ABNORMAL LOW (ref >=60)
Glucose, Bld: 169 mg/dL — ABNORMAL HIGH (ref 65–99)
Potassium: 4.7 mmol/L (ref 3.5–5.3)
Sodium: 137 mmol/L (ref 135–146)

## 2016-10-10 LAB — LIPID PANEL
Cholesterol: 146 mg/dL (ref <200)
HDL: 32 mg/dL — ABNORMAL LOW (ref >40)
LDL Cholesterol: 88 mg/dL (ref <100)
Total CHOL/HDL Ratio: 4.6 Ratio (ref <5.0)
Triglycerides: 130 mg/dL (ref <150)
VLDL: 26 mg/dL (ref <30)

## 2016-10-10 LAB — MAGNESIUM: Magnesium: 1.8 mg/dL (ref 1.5–2.5)

## 2016-10-10 LAB — VITAMIN D 25 HYDROXY (VIT D DEFICIENCY, FRACTURES): Vit D, 25-Hydroxy: 49 ng/mL (ref 30–100)

## 2016-11-04 ENCOUNTER — Other Ambulatory Visit: Payer: Self-pay | Admitting: Internal Medicine

## 2016-11-09 ENCOUNTER — Other Ambulatory Visit: Payer: Self-pay | Admitting: Internal Medicine

## 2016-12-26 ENCOUNTER — Other Ambulatory Visit (HOSPITAL_BASED_OUTPATIENT_CLINIC_OR_DEPARTMENT_OTHER): Payer: Medicare Other

## 2016-12-26 ENCOUNTER — Telehealth: Payer: Self-pay | Admitting: Oncology

## 2016-12-26 ENCOUNTER — Ambulatory Visit (HOSPITAL_BASED_OUTPATIENT_CLINIC_OR_DEPARTMENT_OTHER): Payer: Medicare Other | Admitting: Oncology

## 2016-12-26 VITALS — BP 126/64 | HR 56 | Temp 98.2°F | Resp 18 | Ht 68.0 in | Wt 217.2 lb

## 2016-12-26 DIAGNOSIS — D509 Iron deficiency anemia, unspecified: Secondary | ICD-10-CM | POA: Diagnosis not present

## 2016-12-26 DIAGNOSIS — C9110 Chronic lymphocytic leukemia of B-cell type not having achieved remission: Secondary | ICD-10-CM

## 2016-12-26 LAB — CBC WITH DIFFERENTIAL/PLATELET
BASO%: 0.3 % (ref 0.0–2.0)
Basophils Absolute: 0.1 10*3/uL (ref 0.0–0.1)
EOS%: 1.4 % (ref 0.0–7.0)
Eosinophils Absolute: 0.5 10*3/uL (ref 0.0–0.5)
HCT: 39.1 % (ref 38.4–49.9)
HGB: 12.5 g/dL — ABNORMAL LOW (ref 13.0–17.1)
LYMPH%: 88.6 % — ABNORMAL HIGH (ref 14.0–49.0)
MCH: 23 pg — ABNORMAL LOW (ref 27.2–33.4)
MCHC: 32 g/dL (ref 32.0–36.0)
MCV: 72 fL — ABNORMAL LOW (ref 79.3–98.0)
MONO#: 1.1 10*3/uL — ABNORMAL HIGH (ref 0.1–0.9)
MONO%: 2.8 % (ref 0.0–14.0)
NEUT#: 2.7 10*3/uL (ref 1.5–6.5)
NEUT%: 6.9 % — ABNORMAL LOW (ref 39.0–75.0)
Platelets: 223 10*3/uL (ref 140–400)
RBC: 5.43 10*6/uL (ref 4.20–5.82)
RDW: 16 % — ABNORMAL HIGH (ref 11.0–14.6)
WBC: 38.1 10*3/uL — ABNORMAL HIGH (ref 4.0–10.3)
lymph#: 33.8 10*3/uL — ABNORMAL HIGH (ref 0.9–3.3)
nRBC: 0 % (ref 0–0)

## 2016-12-26 LAB — COMPREHENSIVE METABOLIC PANEL
ALT: 16 U/L (ref 0–55)
AST: 16 U/L (ref 5–34)
Albumin: 4.1 g/dL (ref 3.5–5.0)
Alkaline Phosphatase: 99 U/L (ref 40–150)
Anion Gap: 9 mEq/L (ref 3–11)
BUN: 20.4 mg/dL (ref 7.0–26.0)
CO2: 28 mEq/L (ref 22–29)
Calcium: 10 mg/dL (ref 8.4–10.4)
Chloride: 102 mEq/L (ref 98–109)
Creatinine: 1.2 mg/dL (ref 0.7–1.3)
EGFR: 70 mL/min/{1.73_m2} — ABNORMAL LOW (ref >90)
Glucose: 124 mg/dl (ref 70–140)
Potassium: 4.9 mEq/L (ref 3.5–5.1)
Sodium: 139 mEq/L (ref 136–145)
Total Bilirubin: 0.62 mg/dL (ref 0.20–1.20)
Total Protein: 7.9 g/dL (ref 6.4–8.3)

## 2016-12-26 LAB — TECHNOLOGIST REVIEW

## 2016-12-26 LAB — IRON AND TIBC
%SAT: 20 % (ref 20–55)
Iron: 69 ug/dL (ref 42–163)
TIBC: 351 ug/dL (ref 202–409)
UIBC: 282 ug/dL (ref 117–376)

## 2016-12-26 LAB — FERRITIN: Ferritin: 39 ng/ml (ref 22–316)

## 2016-12-26 LAB — LACTATE DEHYDROGENASE: LDH: 195 U/L (ref 125–245)

## 2016-12-26 NOTE — Telephone Encounter (Signed)
Appointments scheduled per 4.11.18 LOS. Patient given AVS report and calendars with future scheduled appointments. °

## 2016-12-26 NOTE — Progress Notes (Signed)
Hematology and Oncology Follow Up Visit  Nathan Davis [id number] [birth year] 71 y.o. 12/26/2016 8:31 AM Nathan Davis, MDMcKeown, Nathan Saxon, MD   Principle Diagnosis: 71 year old gentleman with stage 0 CLL presented with lymphocytosis. His flow cytometry in April 2017 confirmed this diagnosis. He has also element of iron deficiency anemia.   Current therapy: Observation and surveillance for his CLL. Oral iron supplements for his iron deficiency.  Interim History:  Mr. Davis presents today for a follow-up visit. Since the last visit, he reports no recent complaints or changes. He remains active and attends to activities of daily living. He denied any weight loss or constitutional symptoms. He denied any painful adenopathy, fevers, chills. He denied any hematochezia or melena. He is up-to-date on his colonoscopies. He denied any excessive fatigue or tiredness. He denied any recent hospitalization or illnesses.  He does not report any headaches, blurry vision, syncope or seizures. He does not report any fevers, chills or sweats. Does not report any chest pain, palpitation, orthopnea or leg edema. He does not report any cough, wheezing or hemoptysis. Does not report any nausea, vomiting or abdominal pain. Does not report any constipation, diarrhea, hematochezia or melena. He does not report any frequency urgency or hesitancy. He does not report any skeletal complaints. Remaining review of systems unremarkable.   Medications: I have reviewed the patient's current medications.  Current Outpatient Prescriptions  Medication Sig Dispense Refill  . ALLERGY RELIEF 10 MG tablet TAKE ONE TABLET BY MOUTH DAILY 90 tablet 4  . aspirin 81 MG tablet Take 81 mg by mouth daily.    Marland Kitchen atenolol (TENORMIN) 25 MG tablet TAKE ONE TABLET BY MOUTH AT BEDTIME 90 tablet 1  . atorvastatin (LIPITOR) 80 MG tablet TAKE ONE TABLET BY MOUTH DAILY 90 tablet 0  . Cholecalciferol (VITAMIN D) 2000 UNITS tablet Take  2,000 Units by mouth daily.    . dapagliflozin propanediol (FARXIGA) 10 MG TABS tablet Take 10 mg by mouth daily. 28 tablet 0  . Insulin Pen Needle 31G X 8 MM MISC Check BS twice daily for fluctuations and medication adjustment 200 each 3  . LEVEMIR FLEXPEN 100 UNIT/ML SOPN INJECT 40-50 UNITS SUBCUTANEOUSLY DAILY AS DIRECTED 5 pen 5  . loratadine (CLARITIN) 10 MG tablet Take 1 tablet (10 mg total) by mouth daily. 30 tablet 2  . metFORMIN (GLUCOPHAGE) 1000 MG tablet TAKE 1 TABLET (1,000 MG TOTAL) BY MOUTH 2 (TWO) TIMES DAILY. 180 tablet 0   No current facility-administered medications for this visit.      Allergies:  Allergies  Allergen Reactions  . Amaryl [Glimepiride] Diarrhea  . Codeine Itching  . Pioglitazone Diarrhea, Itching and Anxiety    Past Medical History, Surgical history, Social history, and Family History were reviewed and updated.   Physical Exam: Blood pressure 126/64, pulse (!) 56, temperature 98.2 F (36.8 C), temperature source Oral, resp. rate 18, height 5\' 8"  (1.727 m), weight 217 lb 3.2 oz (98.5 kg), SpO2 99 %. ECOG: 0 General appearance: alert and cooperative appeared without distress. Head: Normocephalic, without obvious abnormality no oral ulcers or lesions. Neck: no adenopathy Lymph nodes: Cervical, supraclavicular, and axillary nodes normal. Heart:regular rate and rhythm, S1, S2 normal, no murmur, click, rub or gallop Lung:chest clear, no wheezing, rales, normal symmetric air entry.  Abdomin: soft, non-tender, without masses or organomegaly no shifting dullness or ascites. EXT:no erythema, induration, or nodules   Lab Results: Lab Results  Component Value Date   WBC 38.1 (H) 12/26/2016   HGB 12.5 (  L) 12/26/2016   HCT 39.1 12/26/2016   MCV 72.0 (L) 12/26/2016   PLT 223 12/26/2016     Chemistry      Component Value Date/Time   NA 137 10/09/2016 1021   NA 138 06/27/2016 1234   K 4.7 10/09/2016 1021   K 4.7 06/27/2016 1234   CL 101 10/09/2016  1021   CO2 22 10/09/2016 1021   CO2 27 06/27/2016 1234   BUN 22 10/09/2016 1021   BUN 18.7 06/27/2016 1234   CREATININE 1.22 (H) 10/09/2016 1021   CREATININE 1.2 06/27/2016 1234      Component Value Date/Time   CALCIUM 9.5 10/09/2016 1021   CALCIUM 9.0 06/27/2016 1234   ALKPHOS 61 10/09/2016 1021   ALKPHOS 94 06/27/2016 1234   AST 20 10/09/2016 1021   AST 16 06/27/2016 1234   ALT 19 10/09/2016 1021   ALT 17 06/27/2016 1234   BILITOT 0.7 10/09/2016 1021   BILITOT 0.65 06/27/2016 1234       Impression and Plan:   71 year old gentleman with the following issues:  1. CLL diagnosed in April 2017. He is currently on observation and surveillance without any changes in his clinical status. He presented with lymphocytosis without any lymphadenopathy or symptoms.   His CBC was reviewed today and did show increase in his total white cell count and lymphocyte percentage. Indication for treatment for this disease was reviewed today which include symptomatic lymphadenopathy, cytopenias or constitutional symptoms. He does not have any of these findings and we'll continue on observation and surveillance.   2. Iron deficiency anemia: He is up-to-date on colonoscopies and Hemoccult testing is negative. Iron studies have been within normal range previously and rechecked today.  3. Autoimmune considerations: He is at risk of developing autoimmune anemia and thrombocytopenia no findings noted at this time.  4. Infectious considerations: He is at risk of infection because of immune dysregulation related to CLL. Continue to educate him about these possibilities and a ways to avoid serious infections. No recent hospitalizations or infections.  5. Follow-up: Will be in 6 months sooner if needed to.   [facility], MD 4/11/20188:31 AM

## 2017-01-08 NOTE — Progress Notes (Signed)
Patient ID: Nathan Davis, male   DOB: Jan 07, 1946, 71 y.o.   MRN: [id number]  MEDICARE ANNUAL WELLNESS VISIT AND FOLLOW UP VISIT  Assessment:    Mixed hyperlipidemia -continue medications, check lipids, decrease fatty foods, increase activity.  - Lipid panel  CKD stage 2 due to type 2 diabetes mellitus ([place]) Discussed general issues about diabetes pathophysiology and management., Educational material distributed., Suggested low cholesterol diet., Encouraged aerobic exercise., Discussed foot care., Reminded to get yearly retinal exam. - Hemoglobin A1c - dicussed ACE, declines at this time, will follow up for monitoring BP 1 month, if still elevated will add on ACE/ARB  Essential hypertension - continue medications, DASH diet, exercise and monitor at home. Call if greater than 130/80.  - CBC with Differential/Platelet - BASIC METABOLIC PANEL WITH GFR - Hepatic function panel - TSH  IRON DEFICIENCY Check CBC   Vitamin D deficiency - VITAMIN D 25 Hydroxy (Vit-D Deficiency, Fractures)   Medication management - Magnesium  PVC (premature ventricular contraction) Continue BB, follow up cardio  Bilateral ear cerumen impaction Clean out ears, will do at home and if not better clean at nurse visit.   CLL (chronic lymphocytic leukemia) (HCC) -     CBC with Differential/Platelet - being monitored.   Encounter for Medicare annual wellness exam  Future Appointments Date Time Provider [place]  04/24/2017 11:00 AM [name], MD [facility] None  06/26/2017 8:30 AM [facility] None  06/26/2017 9:00 AM Wyatt Portela, MD [facility] None     Plan:   During the course of the visit the patient was educated and counseled about appropriate screening and preventive services including:    Pneumococcal vaccine   Influenza vaccine  Td vaccine  Screening electrocardiogram  Bone densitometry screening  Colorectal cancer screening  Diabetes  screening  Glaucoma screening  Nutrition counseling   Advanced directives: requested   Subjective:   Nathan Davis  presents for Medicare Annual Wellness Visit and complete physical.   This very nice 71 y.o. MBM presents for 3 month follow up with Hypertension, Hyperlipidemia, Pre-Diabetes and Vitamin D Deficiency.   Patient is treated for HTN since 2011 & BP has not been checked at home.  Today's BP: (!) 152/74. Patient did have a negative Cardiolite in 2011. Patient has had no complaints of any cardiac type chest pain, palpitations, dyspnea/orthopnea/PND, dizziness, claudication, or dependent edema. Patient has hx/o benign or unifocal VPB's and is followed by Dr Aundra Dubin annually. BP Readings from Last 3 Encounters:  01/09/17 (!) 152/74  12/26/16 126/64  10/09/16 132/76   Hyperlipidemia is controlled with diet & meds. Patient denies myalgias or other med SE's. Last Lipids were at goal - Lab Results  Component Value Date   CHOL 146 10/09/2016   HDL 32 (L) 10/09/2016   LDLCALC 88 10/09/2016   TRIG 130 10/09/2016   CHOLHDL 4.6 10/09/2016   Also, the patient has history of T2_IDDM w/CKD2 and he is on levemir 25 units a day with MF 2000 and farxiga 10 a day denies diabetic polys, paresthesias or visual blurring.  He is Morbidly Obese (BMI 33.42) and admittedly is not dietary compliant. He reports FBG's usu range 140's -150's.  Lab Results  Component Value Date   HGBA1C 9.8 (H) 10/09/2016   Further, the patient also has history of Vitamin D Deficiency and supplements vitamin D without any suspected side-effects.  Lab Results  Component Value Date   VD25OH 49 10/09/2016   BMI is Body mass  index is 32.87 kg/m., he is working on diet and exercise. Wt Readings from Last 3 Encounters:  01/09/17 216 lb 3.2 oz (98.1 kg)  12/26/16 217 lb 3.2 oz (98.5 kg)  10/09/16 215 lb 9.6 oz (97.8 kg)    Names of Other Physician/Practitioners you currently use: 1. [facility] here for primary care 2. Dr Gershon Crane, eye doctor, next Diabetic Eye Exam  May 2017 3. No dentist, has dentures, over 3 years  Patient Care Team: [name], MD as PCP - General (Internal Medicine) [name], MD as Attending Physician (Internal Medicine) Rutherford Guys, MD as Consulting Physician (Ophthalmology) Larey Dresser, MD as Consulting Physician (Cardiology) Inda Castle, MD as Consulting Physician (Gastroenterology) Wyatt Portela, MD as Consulting Physician (Oncology)  Medication Review: Current Outpatient Prescriptions on File Prior to Visit  Medication Sig  . ALLERGY RELIEF 10 MG tablet TAKE ONE TABLET BY MOUTH DAILY  . aspirin 81 MG tablet Take 81 mg by mouth daily.  Marland Kitchen atenolol (TENORMIN) 25 MG tablet TAKE ONE TABLET BY MOUTH AT BEDTIME  . atorvastatin (LIPITOR) 80 MG tablet TAKE ONE TABLET BY MOUTH DAILY  . Cholecalciferol (VITAMIN D) 2000 UNITS tablet Take 2,000 Units by mouth daily.  . dapagliflozin propanediol (FARXIGA) 10 MG TABS tablet Take 10 mg by mouth daily.  . Insulin Pen Needle 31G X 8 MM MISC Check BS twice daily for fluctuations and medication adjustment  . loratadine (CLARITIN) 10 MG tablet Take 1 tablet (10 mg total) by mouth daily.  . metFORMIN (GLUCOPHAGE) 1000 MG tablet TAKE 1 TABLET (1,000 MG TOTAL) BY MOUTH 2 (TWO) TIMES DAILY.   No current facility-administered medications on file prior to visit.     Current Problems (verified) Patient Active Problem List   Diagnosis Date Noted  . CLL (chronic lymphocytic leukemia) ([place]) 10/09/2016  . Hyperlipemia 02/23/2016  . Encounter for Medicare annual wellness exam 11/15/2015  . PVC (premature ventricular contraction) 02/04/2014  . Essential hypertension 12/09/2013  . Vitamin D deficiency 12/09/2013  . Medication management 12/09/2013  . CKD stage 2 due to type 2 diabetes mellitus ([place]) 01/04/2010   Screening Tests Immunization History  Administered Date(s)  Administered  . Influenza, High Dose Seasonal PF 07/22/2013  . Influenza-Unspecified 06/25/2012  . PPD Test 08/27/2013  . Pneumococcal Conjugate-13 08/22/2011  . Pneumococcal Polysaccharide-23 05/30/2016  . Td 09/17/2002  . Tdap 08/27/2013  . Zoster 09/17/2005   Preventative care: Last colonoscopy: 10/17/2010  Influenza 2017 at old work Prevnar 13: 2012 Pneumo 2017 TDAP 2014 Zoster 2007  History reviewed: allergies, current medications, past family history, past medical history, past social history, past surgical history and problem list  Allergies Allergies  Allergen Reactions  . Amaryl [Glimepiride] Diarrhea  . Codeine Itching  . Pioglitazone Diarrhea, Itching and Anxiety    SURGICAL HISTORY He  has a past surgical history that includes ETT - MYOVIEW; Hernia repair; and nissan fundoplication (N/A, 16/06/9603). FAMILY HISTORY His family history includes Alzheimer's disease in his father; Throat cancer in his mother. SOCIAL HISTORY He  reports that he has never smoked. He has never used smokeless tobacco. He reports that he does not drink alcohol or use drugs.  MEDICARE WELLNESS OBJECTIVES: Physical activity: Current Exercise Habits: Home exercise routine, Type of exercise: walking, Time (Minutes): 20, Frequency (Times/Week): 4, Weekly Exercise (Minutes/Week): 80, Intensity: Mild Cardiac risk factors: Cardiac Risk Factors include: advanced age (>21men, >68 women);diabetes mellitus;dyslipidemia;hypertension;male gender;obesity (BMI >30kg/m2);sedentary lifestyle Depression/mood screen:   Depression  screen PHQ 2/9 01/09/2017  Decreased Interest 0  Down, Depressed, Hopeless 0  PHQ - 2 Score 0    ADLs:  In your present state of health, do you have any difficulty performing the following activities: 01/09/2017 10/09/2016  Hearing? N N  Vision? N N  Difficulty concentrating or making decisions? N N  Walking or climbing stairs? N N  Dressing or bathing? N N  Doing errands,  shopping? Y N  Some recent data might be hidden     Cognitive Testing  Alert? Yes  Normal Appearance?Yes  Oriented to person? Yes  Place? Yes   Time? Yes  Recall of three objects?  Yes  Can perform simple calculations? Yes  Displays appropriate judgment?Yes  Can read the correct time from a watch face?Yes  EOL planning: Does Patient Have a Medical Advance Directive?: No   Objective:     Blood pressure (!) 152/74, pulse 85, temperature 97.5 F (36.4 C), resp. rate 14, height 5\' 8"  (1.727 m), weight 216 lb 3.2 oz (98.1 kg), SpO2 98 %.   General Appearance:  Alert  WD/WN, male , in no apparent distress. Eyes: PERRLA, EOMs, conjunctiva no swelling or erythema, normal fundi and vessels.No diabetic retinopathy noted.  Sinuses: No frontal/maxillary tenderness ENT/Mouth: EACs patent / TMs  nl. Nares clear without erythema, swelling, mucoid exudates. Oral hygiene is good. No erythema, swelling, or exudate. Tongue normal, non-obstructing. Tonsils not swollen or erythematous. Hearing normal.  Neck: Supple, thyroid normal. No bruits, nodes or JVD. Respiratory: Respiratory effort normal.  BS equal and clear bilateral without rales, rhonci, wheezing or stridor. Cardio: Heart sounds are normal with regular rate and rhythm and no murmurs, rubs or gallops. Peripheral pulses are normal and equal bilaterally without edema. No aortic or femoral bruits. No signs of PVD. Chest: symmetric with normal excursions and percussion.  Abdomen: Flat, soft, with nl bowel sounds. Nontender, no guarding, rebound, hernias, masses, or organomegaly.  Lymphatics: Non tender without lymphadenopathy.  Musculoskeletal: Full ROM all peripheral extremities, joint stability, 5/5 strength, and normal gait. Skin: Warm and dry without rashes, lesions, cyanosis, clubbing or  ecchymosis.  Neuro: Cranial nerves intact, reflexes equal bilaterally. Normal muscle tone, no cerebellar symptoms. Sensation intact.  Pysch: Awake and  oriented X 3 with normal affect, insight and judgment appropriate.   Medicare Attestation I have personally reviewed: The patient's medical and social history Their use of alcohol, tobacco or illicit drugs Their current medications and supplements The patient's functional ability including ADLs,fall risks, home safety risks, cognitive, and hearing and visual impairment Diet and physical activities - encouraged continue daily exercises Evidence for depression or mood disorders  The patient's weight, height, BMI, and visual acuity have been recorded in the chart.  I have made referrals, counseling, and provided education to the patient based on review of the above and I have provided the patient with a written personalized care plan for preventive services.  Over 40 minutes of exam, counseling, chart review and critical decision making was performed.   Vicie Mutters, PA-C   01/09/2017

## 2017-01-09 ENCOUNTER — Ambulatory Visit: Payer: Self-pay | Admitting: Internal Medicine

## 2017-01-09 ENCOUNTER — Other Ambulatory Visit: Payer: Self-pay | Admitting: Internal Medicine

## 2017-01-09 ENCOUNTER — Ambulatory Visit (INDEPENDENT_AMBULATORY_CARE_PROVIDER_SITE_OTHER): Payer: Medicare Other | Admitting: Physician Assistant

## 2017-01-09 ENCOUNTER — Encounter: Payer: Self-pay | Admitting: Physician Assistant

## 2017-01-09 VITALS — BP 152/74 | HR 85 | Temp 97.5°F | Resp 14 | Ht 68.0 in | Wt 216.2 lb

## 2017-01-09 DIAGNOSIS — C9110 Chronic lymphocytic leukemia of B-cell type not having achieved remission: Secondary | ICD-10-CM

## 2017-01-09 DIAGNOSIS — E782 Mixed hyperlipidemia: Secondary | ICD-10-CM | POA: Diagnosis not present

## 2017-01-09 DIAGNOSIS — Z0000 Encounter for general adult medical examination without abnormal findings: Secondary | ICD-10-CM

## 2017-01-09 DIAGNOSIS — E1122 Type 2 diabetes mellitus with diabetic chronic kidney disease: Secondary | ICD-10-CM

## 2017-01-09 DIAGNOSIS — R6889 Other general symptoms and signs: Secondary | ICD-10-CM

## 2017-01-09 DIAGNOSIS — Z79899 Other long term (current) drug therapy: Secondary | ICD-10-CM

## 2017-01-09 DIAGNOSIS — I493 Ventricular premature depolarization: Secondary | ICD-10-CM

## 2017-01-09 DIAGNOSIS — E559 Vitamin D deficiency, unspecified: Secondary | ICD-10-CM

## 2017-01-09 DIAGNOSIS — I10 Essential (primary) hypertension: Secondary | ICD-10-CM

## 2017-01-09 DIAGNOSIS — Z0001 Encounter for general adult medical examination with abnormal findings: Secondary | ICD-10-CM

## 2017-01-09 DIAGNOSIS — C9190 Lymphoid leukemia, unspecified not having achieved remission: Secondary | ICD-10-CM | POA: Diagnosis not present

## 2017-01-09 DIAGNOSIS — N182 Chronic kidney disease, stage 2 (mild): Secondary | ICD-10-CM

## 2017-01-09 LAB — HEPATIC FUNCTION PANEL
ALT: 15 U/L (ref 9–46)
AST: 18 U/L (ref 10–35)
Albumin: 4.2 g/dL (ref 3.6–5.1)
Alkaline Phosphatase: 72 U/L (ref 40–115)
Bilirubin, Direct: 0.1 mg/dL (ref <=0.2)
Indirect Bilirubin: 0.5 mg/dL (ref 0.2–1.2)
Total Bilirubin: 0.6 mg/dL (ref 0.2–1.2)
Total Protein: 7.4 g/dL (ref 6.1–8.1)

## 2017-01-09 LAB — LIPID PANEL
Cholesterol: 210 mg/dL — ABNORMAL HIGH (ref <200)
HDL: 30 mg/dL — ABNORMAL LOW (ref >40)
LDL Cholesterol: 145 mg/dL — ABNORMAL HIGH (ref <100)
Total CHOL/HDL Ratio: 7 Ratio — ABNORMAL HIGH (ref <5.0)
Triglycerides: 175 mg/dL — ABNORMAL HIGH (ref <150)
VLDL: 35 mg/dL — ABNORMAL HIGH (ref <30)

## 2017-01-09 LAB — CBC WITH DIFFERENTIAL/PLATELET
Basophils Absolute: 0 cells/uL (ref 0–200)
Basophils Relative: 0 %
Eosinophils Absolute: 386 cells/uL (ref 15–500)
Eosinophils Relative: 1 %
HCT: 40.5 % (ref 38.5–50.0)
Hemoglobin: 12.5 g/dL — ABNORMAL LOW (ref 13.2–17.1)
Lymphocytes Relative: 88 %
Lymphs Abs: 33968 cells/uL — ABNORMAL HIGH (ref 850–3900)
MCH: 22.6 pg — ABNORMAL LOW (ref 27.0–33.0)
MCHC: 30.9 g/dL — ABNORMAL LOW (ref 32.0–36.0)
MCV: 73.4 fL — ABNORMAL LOW (ref 80.0–100.0)
MPV: 9.6 fL (ref 7.5–12.5)
Monocytes Absolute: 1544 cells/uL — ABNORMAL HIGH (ref 200–950)
Monocytes Relative: 4 %
Neutro Abs: 2702 cells/uL (ref 1500–7800)
Neutrophils Relative %: 7 %
Platelets: 228 10*3/uL (ref 140–400)
RBC: 5.52 MIL/uL (ref 4.20–5.80)
RDW: 16.1 % — ABNORMAL HIGH (ref 11.0–15.0)
WBC: 38.6 10*3/uL — ABNORMAL HIGH (ref 3.8–10.8)

## 2017-01-09 LAB — BASIC METABOLIC PANEL WITH GFR
BUN: 19 mg/dL (ref 7–25)
CO2: 26 mmol/L (ref 20–31)
Calcium: 9.5 mg/dL (ref 8.6–10.3)
Chloride: 99 mmol/L (ref 98–110)
Creat: 1.27 mg/dL — ABNORMAL HIGH (ref 0.70–1.18)
GFR, Est African American: 65 mL/min (ref >=60)
GFR, Est Non African American: 56 mL/min — ABNORMAL LOW (ref >=60)
Glucose, Bld: 149 mg/dL — ABNORMAL HIGH (ref 65–99)
Potassium: 4.4 mmol/L (ref 3.5–5.3)
Sodium: 135 mmol/L (ref 135–146)

## 2017-01-09 LAB — MAGNESIUM: Magnesium: 1.8 mg/dL (ref 1.5–2.5)

## 2017-01-09 LAB — TSH: TSH: 0.89 mIU/L (ref 0.40–4.50)

## 2017-01-09 NOTE — Patient Instructions (Addendum)
Use a dropper or use a cap to put olive oil,mineral oil or canola oil  OR peroxide in the effected ear- 2-3 times a week. Let it soak for 20-30 min then you can take a shower or use a baby bulb with warm water to wash out the ear wax.  Do not use Qtips   Monitor your blood pressure at home. Go to the ER if any CP, SOB, nausea, dizziness, severe HA, changes vision/speech  Goal BP:  For patients younger than 60: Goal BP < 140/90. For patients 60 and older: Goal BP < 150/90. For patients with diabetes: Goal BP < 140/90. Your most recent BP: BP: (!) 152/74   Take your medications faithfully as instructed. Maintain a healthy weight. Get at least 150 minutes of aerobic exercise per week. Minimize salt intake. Minimize alcohol intake  DASH Eating Plan DASH stands for "Dietary Approaches to Stop Hypertension." The DASH eating plan is a healthy eating plan that has been shown to reduce high blood pressure (hypertension). Additional health benefits may include reducing the risk of type 2 diabetes mellitus, heart disease, and stroke. The DASH eating plan may also help with weight loss. WHAT DO I NEED TO KNOW ABOUT THE DASH EATING PLAN? For the DASH eating plan, you will follow these general guidelines:  Choose foods with a percent daily value for sodium of less than 5% (as listed on the food label).  Use salt-free seasonings or herbs instead of table salt or sea salt.  Check with your health care provider or pharmacist before using salt substitutes.  Eat lower-sodium products, often labeled as "lower sodium" or "no salt added."  Eat fresh foods.  Eat more vegetables, fruits, and low-fat dairy products.  Choose whole grains. Look for the word "whole" as the first word in the ingredient list.  Choose fish and skinless chicken or [place] more often than red meat. Limit fish, poultry, and meat to 6 oz (170 g) each day.  Limit sweets, desserts, sugars, and sugary drinks.  Choose heart-healthy  fats.  Limit cheese to 1 oz (28 g) per day.  Eat more home-cooked food and less restaurant, buffet, and fast food.  Limit fried foods.  Cook foods using methods other than frying.  Limit canned vegetables. If you do use them, rinse them well to decrease the sodium.  When eating at a restaurant, ask that your food be prepared with less salt, or no salt if possible. WHAT FOODS CAN I EAT? Seek help from a dietitian for individual calorie needs. Grains Whole grain or whole wheat bread. Brown rice. Whole grain or whole wheat pasta. Quinoa, bulgur, and whole grain cereals. Low-sodium cereals. Corn or whole wheat flour tortillas. Whole grain cornbread. Whole grain crackers. Low-sodium crackers. Vegetables Fresh or frozen vegetables (raw, steamed, roasted, or grilled). Low-sodium or reduced-sodium tomato and vegetable juices. Low-sodium or reduced-sodium tomato sauce and paste. Low-sodium or reduced-sodium canned vegetables.  Fruits All fresh, canned (in natural juice), or frozen fruits. Meat and Other Protein Products Ground beef (85% or leaner), grass-fed beef, or beef trimmed of fat. Skinless chicken or [place]. Ground chicken or [place]. Pork trimmed of fat. All fish and seafood. Eggs. Dried beans, peas, or lentils. Unsalted nuts and seeds. Unsalted canned beans. Dairy Low-fat dairy products, such as skim or 1% milk, 2% or reduced-fat cheeses, low-fat ricotta or cottage cheese, or plain low-fat yogurt. Low-sodium or reduced-sodium cheeses. Fats and Oils Tub margarines without trans fats. Light or reduced-fat mayonnaise and salad dressings (  reduced sodium). Avocado. Safflower, olive, or canola oils. Natural peanut or almond butter. Other Unsalted popcorn and pretzels. The items listed above may not be a complete list of recommended foods or beverages. Contact your dietitian for more options. WHAT FOODS ARE NOT RECOMMENDED? Grains White bread. White pasta. White rice. Refined cornbread.  Bagels and croissants. Crackers that contain trans fat. Vegetables Creamed or fried vegetables. Vegetables in a cheese sauce. Regular canned vegetables. Regular canned tomato sauce and paste. Regular tomato and vegetable juices. Fruits Dried fruits. Canned fruit in light or heavy syrup. Fruit juice. Meat and Other Protein Products Fatty cuts of meat. Ribs, chicken wings, bacon, sausage, bologna, salami, chitterlings, fatback, hot dogs, bratwurst, and packaged luncheon meats. Salted nuts and seeds. Canned beans with salt. Dairy Whole or 2% milk, cream, half-and-half, and cream cheese. Whole-fat or sweetened yogurt. Full-fat cheeses or blue cheese. Nondairy creamers and whipped toppings. Processed cheese, cheese spreads, or cheese curds. Condiments Onion and garlic salt, seasoned salt, table salt, and sea salt. Canned and packaged gravies. Worcestershire sauce. Tartar sauce. Barbecue sauce. Teriyaki sauce. Soy sauce, including reduced sodium. Steak sauce. Fish sauce. Oyster sauce. Cocktail sauce. Horseradish. Ketchup and mustard. Meat flavorings and tenderizers. Bouillon cubes. Hot sauce. Tabasco sauce. Marinades. Taco seasonings. Relishes. Fats and Oils Butter, stick margarine, lard, shortening, ghee, and bacon fat. Coconut, palm kernel, or palm oils. Regular salad dressings. Other Pickles and olives. Salted popcorn and pretzels. The items listed above may not be a complete list of foods and beverages to avoid. Contact your dietitian for more information. WHERE CAN I FIND MORE INFORMATION? National Heart, Lung, and Blood Institute: travelstabloid.com Document Released: 08/23/2011 Document Revised: 01/18/2014 Document Reviewed: 07/08/2013 [facility] Patient Information 2015 [place], [place]. This information is not intended to replace advice given to you by your health care provider. Make sure you discuss any questions you have with your health care provider.

## 2017-01-10 LAB — HEMOGLOBIN A1C
Hgb A1c MFr Bld: 10.1 % — ABNORMAL HIGH (ref <5.7)
Mean Plasma Glucose: 243 mg/dL

## 2017-01-10 NOTE — Progress Notes (Signed)
Need 1 month DM visit

## 2017-01-10 NOTE — Progress Notes (Signed)
Pt aware of lab results & voiced understanding of those results. MESSAGE sent to front office for Need 1 month DM visit.

## 2017-01-24 LAB — HM DIABETES EYE EXAM

## 2017-02-06 ENCOUNTER — Ambulatory Visit: Payer: Self-pay

## 2017-02-11 NOTE — Progress Notes (Deleted)
   Assessment and Plan:    HPI 71 y.o.male presents for BP and DM follow up.    Past Medical History:  Diagnosis Date  . Cataract   . Colon polyps   . Diabetes ([place]) 1992  . ED (erectile dysfunction)   . Femur fracture (HCC)    left  . Iron (Fe) deficiency anemia   . Obesity   . Other and unspecified hyperlipidemia   . Premature beats, unspecified   . Unspecified essential hypertension   . Vitamin D deficiency      Allergies  Allergen Reactions  . Amaryl [Glimepiride] Diarrhea  . Codeine Itching  . Pioglitazone Diarrhea, Itching and Anxiety    Current Outpatient Prescriptions on File Prior to Visit  Medication Sig  . ALLERGY RELIEF 10 MG tablet TAKE ONE TABLET BY MOUTH DAILY  . aspirin 81 MG tablet Take 81 mg by mouth daily.  Marland Kitchen atenolol (TENORMIN) 25 MG tablet TAKE ONE TABLET BY MOUTH AT BEDTIME  . atorvastatin (LIPITOR) 80 MG tablet TAKE ONE TABLET BY MOUTH DAILY  . Cholecalciferol (VITAMIN D) 2000 UNITS tablet Take 2,000 Units by mouth daily.  . dapagliflozin propanediol (FARXIGA) 10 MG TABS tablet Take 10 mg by mouth daily.  . Insulin Pen Needle 31G X 8 MM MISC Check BS twice daily for fluctuations and medication adjustment  . LEVEMIR FLEXTOUCH 100 UNIT/ML Pen INJECT 10 UNITS INTO THE SKIN AT BEDTIME, OR AS DIRECTED  . loratadine (CLARITIN) 10 MG tablet Take 1 tablet (10 mg total) by mouth daily.  . metFORMIN (GLUCOPHAGE) 1000 MG tablet TAKE 1 TABLET (1,000 MG TOTAL) BY MOUTH 2 (TWO) TIMES DAILY.   No current facility-administered medications on file prior to visit.     ROS: all negative except above.   Physical Exam: There were no vitals filed for this visit. There were no vitals taken for this visit. General Appearance: Well nourished, in no apparent distress. Eyes: PERRLA, EOMs, conjunctiva no swelling or erythema Sinuses: No Frontal/maxillary tenderness ENT/Mouth: Ext aud canals clear, TMs without erythema, bulging. No erythema, swelling, or exudate on  post pharynx.  Tonsils not swollen or erythematous. Hearing normal.  Neck: Supple, thyroid normal.  Respiratory: Respiratory effort normal, BS equal bilaterally without rales, rhonchi, wheezing or stridor.  Cardio: RRR with no MRGs. Brisk peripheral pulses without edema.  Abdomen: Soft, + BS.  Non tender, no guarding, rebound, hernias, masses. Lymphatics: Non tender without lymphadenopathy.  Musculoskeletal: Full ROM, 5/5 strength, normal gait.  Skin: Warm, dry without rashes, lesions, ecchymosis.  Neuro: Cranial nerves intact. Normal muscle tone, no cerebellar symptoms. Sensation intact.  Psych: Awake and oriented X 3, normal affect, Insight and Judgment appropriate.     Vicie Mutters, PA-C 4:52 PM [facility]

## 2017-02-13 ENCOUNTER — Ambulatory Visit: Payer: Self-pay | Admitting: Physician Assistant

## 2017-02-16 NOTE — Progress Notes (Deleted)
   Assessment and Plan:    HPI 71 y.o.male presents for BP and DM follow up.    Past Medical History:  Diagnosis Date  . Cataract   . Colon polyps   . Diabetes ([place]) 1992  . ED (erectile dysfunction)   . Femur fracture ([facility])    left  . Iron (Fe) deficiency anemia   . Obesity   . Other and unspecified hyperlipidemia   . Premature beats, unspecified   . Unspecified essential hypertension   . Vitamin D deficiency      Allergies  Allergen Reactions  . Amaryl [Glimepiride] Diarrhea  . Codeine Itching  . Pioglitazone Diarrhea, Itching and Anxiety    Current Outpatient Prescriptions on File Prior to Visit  Medication Sig  . ALLERGY RELIEF 10 MG tablet TAKE ONE TABLET BY MOUTH DAILY  . aspirin 81 MG tablet Take 81 mg by mouth daily.  Marland Kitchen atenolol (TENORMIN) 25 MG tablet TAKE ONE TABLET BY MOUTH AT BEDTIME  . atorvastatin (LIPITOR) 80 MG tablet TAKE ONE TABLET BY MOUTH DAILY  . Cholecalciferol (VITAMIN D) 2000 UNITS tablet Take 2,000 Units by mouth daily.  . dapagliflozin propanediol (FARXIGA) 10 MG TABS tablet Take 10 mg by mouth daily.  . Insulin Pen Needle 31G X 8 MM MISC Check BS twice daily for fluctuations and medication adjustment  . LEVEMIR FLEXTOUCH 100 UNIT/ML Pen INJECT 10 UNITS INTO THE SKIN AT BEDTIME, OR AS DIRECTED  . loratadine (CLARITIN) 10 MG tablet Take 1 tablet (10 mg total) by mouth daily.  . metFORMIN (GLUCOPHAGE) 1000 MG tablet TAKE 1 TABLET (1,000 MG TOTAL) BY MOUTH 2 (TWO) TIMES DAILY.   No current facility-administered medications on file prior to visit.     ROS: all negative except above.   Physical Exam: There were no vitals filed for this visit. There were no vitals taken for this visit. General Appearance: Well nourished, in no apparent distress. Eyes: PERRLA, EOMs, conjunctiva no swelling or erythema Sinuses: No Frontal/maxillary tenderness ENT/Mouth: Ext aud canals clear, TMs without erythema, bulging. No erythema, swelling, or exudate on  post pharynx.  Tonsils not swollen or erythematous. Hearing normal.  Neck: Supple, thyroid normal.  Respiratory: Respiratory effort normal, BS equal bilaterally without rales, rhonchi, wheezing or stridor.  Cardio: RRR with no MRGs. Brisk peripheral pulses without edema.  Abdomen: Soft, + BS.  Non tender, no guarding, rebound, hernias, masses. Lymphatics: Non tender without lymphadenopathy.  Musculoskeletal: Full ROM, 5/5 strength, normal gait.  Skin: Warm, dry without rashes, lesions, ecchymosis.  Neuro: Cranial nerves intact. Normal muscle tone, no cerebellar symptoms. Sensation intact.  Psych: Awake and oriented X 3, normal affect, Insight and Judgment appropriate.     Vicie Mutters, PA-C 8:16 AM [facility]

## 2017-02-18 ENCOUNTER — Ambulatory Visit: Payer: Self-pay | Admitting: Physician Assistant

## 2017-02-27 ENCOUNTER — Encounter: Payer: Self-pay | Admitting: Internal Medicine

## 2017-03-16 ENCOUNTER — Other Ambulatory Visit: Payer: Self-pay | Admitting: Internal Medicine

## 2017-03-26 ENCOUNTER — Encounter: Payer: Self-pay | Admitting: Internal Medicine

## 2017-03-26 NOTE — Progress Notes (Signed)
Assessment and Plan:  Essential hypertension Continue medications, discussed need to monitor HR and monitor for bradycardia  CKD stage 2 due to type 2 diabetes mellitus ([place]) Has done very well with weight loss Explained that we may need to switch to 70/30 for meal time coverage Wants to see where A1C is in 3 months at august visit Continue weight loss  Morbid Obesity with co morbidities - long discussion about weight loss, diet, and exercise  Future Appointments Date Time Provider [place]  04/24/2017 11:00 AM [name], MD [facility] None  06/26/2017 8:30 AM [facility] ONLY [facility] None  06/26/2017 9:00 AM Wyatt Portela, MD [facility] None    HPI 71 y.o.male presents for HTN and DM. Not on ACE/ARB, has some bradycardia with BB, will monitor, follows up cardiology. A1C went up to 10.1, he is on levemir 25mg  a day, farxiga 10mg .  Lab Results  Component Value Date   HGBA1C 10.1 (H) 01/09/2017   BMI is Body mass index is 31.78 kg/m., he is working on diet and exercise. Wt Readings from Last 3 Encounters:  03/27/17 209 lb (94.8 kg)  01/09/17 216 lb 3.2 oz (98.1 kg)  12/26/16 217 lb 3.2 oz (98.5 kg)    Blood pressure 124/70, pulse (!) 50, temperature (!) 97.5 F (36.4 C), resp. rate 16, height 5\' 8"  (1.727 m), weight 209 lb (94.8 kg).   Past Medical History:  Diagnosis Date  . Cataract   . Colon polyps   . Diabetes ([place]) 1992  . ED (erectile dysfunction)   . Femur fracture (HCC)    left  . Iron (Fe) deficiency anemia   . Obesity   . Other and unspecified hyperlipidemia   . Premature beats, unspecified   . Unspecified essential hypertension   . Vitamin D deficiency      Allergies  Allergen Reactions  . Amaryl [Glimepiride] Diarrhea  . Codeine Itching  . Pioglitazone Diarrhea, Itching and Anxiety    Current Outpatient Prescriptions on File Prior to Visit  Medication Sig  . ALLERGY RELIEF 10 MG tablet TAKE ONE TABLET BY MOUTH DAILY    . aspirin 81 MG tablet Take 81 mg by mouth daily.  Marland Kitchen atenolol (TENORMIN) 25 MG tablet TAKE ONE TABLET BY MOUTH AT BEDTIME  . atorvastatin (LIPITOR) 80 MG tablet TAKE ONE TABLET BY MOUTH DAILY  . Cholecalciferol (VITAMIN D) 2000 UNITS tablet Take 2,000 Units by mouth daily.  . dapagliflozin propanediol (FARXIGA) 10 MG TABS tablet Take 10 mg by mouth daily.  . Insulin Pen Needle 31G X 8 MM MISC Check BS twice daily for fluctuations and medication adjustment  . LEVEMIR FLEXTOUCH 100 UNIT/ML Pen INJECT 10 UNITS INTO THE SKIN AT BEDTIME, OR AS DIRECTED  . loratadine (CLARITIN) 10 MG tablet Take 1 tablet (10 mg total) by mouth daily.  . metFORMIN (GLUCOPHAGE) 1000 MG tablet TAKE ONE TABLET BY MOUTH TWO TIMES DAILY   No current facility-administered medications on file prior to visit.     ROS: all negative except above.   Physical Exam: Filed Weights   03/27/17 1126  Weight: 209 lb (94.8 kg)   BP 124/70   Pulse (!) 50   Temp (!) 97.5 F (36.4 C)   Resp 16   Ht 5\' 8"  (1.727 m)   Wt 209 lb (94.8 kg)   BMI 31.78 kg/m  General Appearance: Well nourished, in no apparent distress. Eyes: PERRLA, EOMs, conjunctiva no swelling or erythema Sinuses: No Frontal/maxillary tenderness ENT/Mouth: Ext  aud canals clear, TMs without erythema, bulging. No erythema, swelling, or exudate on post pharynx.  Tonsils not swollen or erythematous. Hearing normal.  Neck: Supple, thyroid normal.  Respiratory: Respiratory effort normal, BS equal bilaterally without rales, rhonchi, wheezing or stridor.  Cardio: RRR with no MRGs. Brisk peripheral pulses without edema.  Abdomen: Soft, + BS.  Non tender, no guarding, rebound, hernias, masses. Lymphatics: Non tender without lymphadenopathy.  Musculoskeletal: Full ROM, 5/5 strength, normal gait.  Skin: Warm, dry without rashes, lesions, ecchymosis.  Neuro: Cranial nerves intact. Normal muscle tone, no cerebellar symptoms. Sensation intact.  Psych: Awake and oriented  X 3, normal affect, Insight and Judgment appropriate.     Vicie Mutters, PA-C 11:46 AM [facility]

## 2017-03-27 ENCOUNTER — Ambulatory Visit (INDEPENDENT_AMBULATORY_CARE_PROVIDER_SITE_OTHER): Payer: Medicare Other | Admitting: Physician Assistant

## 2017-03-27 ENCOUNTER — Encounter: Payer: Self-pay | Admitting: Physician Assistant

## 2017-03-27 VITALS — BP 124/70 | HR 50 | Temp 97.5°F | Resp 16 | Ht 68.0 in | Wt 209.0 lb

## 2017-03-27 DIAGNOSIS — I10 Essential (primary) hypertension: Secondary | ICD-10-CM | POA: Diagnosis not present

## 2017-03-27 DIAGNOSIS — N182 Chronic kidney disease, stage 2 (mild): Secondary | ICD-10-CM

## 2017-03-27 DIAGNOSIS — E1122 Type 2 diabetes mellitus with diabetic chronic kidney disease: Secondary | ICD-10-CM

## 2017-03-27 DIAGNOSIS — Z6831 Body mass index (BMI) 31.0-31.9, adult: Secondary | ICD-10-CM | POA: Diagnosis not present

## 2017-03-27 DIAGNOSIS — E6609 Other obesity due to excess calories: Secondary | ICD-10-CM | POA: Diagnosis not present

## 2017-03-27 NOTE — Patient Instructions (Addendum)
Your A1C is a measure of your sugar over the past 3 months and is not affected by what you have eaten over the past few days. Diabetes increases your chances of stroke and heart attack over 300 % and is the leading cause of blindness and kidney failure in the [place]. Please make sure you decrease bad carbs like white bread, white rice, potatoes, corn, soft drinks, pasta, cereals, refined sugars, sweet tea, dried fruits, and fruit juice. Good carbs are okay to eat in moderation like sweet potatoes, brown rice, whole grain pasta/bread, most fruit (except dried fruit) and you can eat as many veggies as you want.   Greater than 6.5 is considered diabetic. Between 6.4 and 5.7 is prediabetic If your A1C is less than 5.7 you are NOT diabetic.  Targets for Glucose Readings: Time of Check Target for patients WITHOUT Diabetes Target for DIABETICS  Before Meals Less than 100  less than 150  Two hours after meals Less than 200  Less than 250    If your morning sugar is always below 100 then the issue is with your sugar spiking after meals. Try to take your blood sugar approximately 2 hours after eating, this number should be less than 200. If it is not, think about the foods that you ate and better choices you can make.   THE LEVEMIR ONLY AFFECTS THE MORNING SUGAR, IF YOUR MORNING SUGAR IS STILL ABOVE 150 IF LIFESTYLE CHANGES AND THE LEVEMIR DOES NOT HELP WE WILL SWITCH YOU TO THE 70/30 MIX FOR MEAL TIME COVERAGE  KEEP UP THE GREAT WORK WITH WEIGHT LOSS, HEAR IS MORE INFORMATION     Bad carbs also include fruit juice, alcohol, and sweet tea. These are empty calories that do not signal to your brain that you are full.   Please remember the good carbs are still carbs which convert into sugar. So please measure them out no more than 1/2-1 cup of rice, oatmeal, pasta, and beans  Veggies are however free foods! Pile them on.   Not all fruit is created equal. Please see the list below, the fruit at  the bottom is higher in sugars than the fruit at the top. Please avoid all dried fruits.    We want weight loss that will last so you should lose 1-2 pounds a week.  THAT IS IT! Please pick THREE things a month to change. Once it is a habit check off the item. Then pick another three items off the list to become habits.  If you are already doing a habit on the list GREAT!  Cross that item off! o Don't drink your calories. Ie, alcohol, soda, fruit juice, and sweet tea.  o Drink more water. Drink a glass when you feel hungry or before each meal.  o Eat breakfast - Complex carb and protein (likeDannon light and fit yogurt, oatmeal, fruit, eggs, [place] bacon). o Measure your cereal.  Eat no more than one cup a day. (ie [place]) o Eat an apple a day. o Add a vegetable a day. o Try a new vegetable a month. o Use Pam! Stop using oil or butter to cook. o Don't finish your plate or use smaller plates. o Share your dessert. o Eat sugar free Jello for dessert or frozen grapes. o Don't eat 2-3 hours before bed. o Switch to whole wheat bread, pasta, and brown rice. o Make healthier choices when you eat out. No fries! o Pick baked chicken, NOT fried. o Don't  forget to SLOW DOWN when you eat. It is not going anywhere.  o Take the stairs. o Park far away in the parking lot o [employer] (or weights) for 10 minutes while watching TV. o Walk at work for 10 minutes during break. o Walk outside 1 time a week with your friend, kids, dog, or significant other. o Start a walking group at [place] the mall as much as you can tolerate.  o Keep a food diary. o Weigh yourself daily. o Walk for 15 minutes 3 days per week. o Cook at home more often and eat out less.  If life happens and you go back to old habits, it is okay.  Just start over. You can do it!   If you experience chest pain, get short of breath, or tired during the exercise, please stop immediately and inform your doctor.

## 2017-04-24 ENCOUNTER — Encounter: Payer: Self-pay | Admitting: Internal Medicine

## 2017-05-01 ENCOUNTER — Other Ambulatory Visit: Payer: Self-pay

## 2017-05-01 ENCOUNTER — Other Ambulatory Visit: Payer: Self-pay | Admitting: Internal Medicine

## 2017-05-01 DIAGNOSIS — E109 Type 1 diabetes mellitus without complications: Secondary | ICD-10-CM

## 2017-05-01 MED ORDER — INSULIN PEN NEEDLE 31G X 8 MM MISC: 2 refills | Status: DC

## 2017-05-01 MED ORDER — INSULIN DETEMIR 100 UNIT/ML FLEXPEN: PEN_INJECTOR | SUBCUTANEOUS | 5 refills | Status: DC

## 2017-05-26 DIAGNOSIS — E119 Type 2 diabetes mellitus without complications: Secondary | ICD-10-CM | POA: Insufficient documentation

## 2017-05-26 DIAGNOSIS — Z794 Long term (current) use of insulin: Secondary | ICD-10-CM

## 2017-05-26 NOTE — Progress Notes (Signed)
NO SHOW

## 2017-05-27 ENCOUNTER — Ambulatory Visit: Payer: Self-pay | Admitting: Internal Medicine

## 2017-06-09 NOTE — Progress Notes (Signed)
This very nice 71 y.o. MBM presents for 3 month follow up with Hypertension, Hyperlipidemia, Insulin req T2_DM, and Vitamin D Deficiency. Also patient is dx'd with Stage ) CLL followed by Active Surveillance by Dr Alen Blew.      Patient is treated for HTN  (2011)  BP has been controlled at home. Today's BP is at goal - 136/80. Patient had a negative Stress Test in 2011 and has been followed by Dr Aundra Dubin. Patient has had no complaints of any cardiac type chest pain, palpitations, dyspnea/orthopnea/PND, dizziness, claudication, or dependent edema.     Hyperlipidemia is not controlled when patient is off of his meds. Last Lipids off of meds were not at goal: Lab Results  Component Value Date   CHOL 210 (H) 01/09/2017   HDL 30 (L) 01/09/2017   LDLCALC 145 (H) 01/09/2017   TRIG 175 (H) 01/09/2017   CHOLHDL 7.0 (H) 01/09/2017      Also, the patient has history of Morbid Obesity (BMI 32+), gluttonous overeating  and T2_NIDDM (1992) w/CKD2 (GFR 70) and has had no symptoms of reactive hypoglycemia, diabetic polys, paresthesias or visual blurring.  Last A1c was not at goal: Lab Results  Component Value Date   HGBA1C 10.1 (H) 01/09/2017      Further, the patient also has history of Vitamin D Deficiency ("34" in 2015) and supplements vitamin D without any suspected side-effects. Last vitamin D was still low (goal 70-200): Lab Results  Component Value Date   VD25OH 49 10/09/2016   Current Outpatient Prescriptions on File Prior to Visit  Medication Sig  . aspirin 81 MG tablet Take 81 mg by mouth daily.  Marland Kitchen atenolol (TENORMIN) 25 MG tablet TAKE ONE TABLET BY MOUTH AT BEDTIME  . atorvastatin (LIPITOR) 80 MG tablet TAKE ONE TABLET BY MOUTH DAILY  . dapagliflozin propanediol (FARXIGA) 10 MG TABS tablet Take 10 mg by mouth daily.  . Insulin Detemir (LEVEMIR FLEXTOUCH) 100 UNIT/ML Pen INJECT 10 UNITS INTO THE SKIN AT BEDTIME, OR AS DIRECTED  . Insulin Pen Needle (B-D ULTRAFINE III SHORT PEN) 31G X 8 MM  MISC CHECK BLOOD SUGAR TWICE DAILY FOR FLUCTUATIONS AND MEDICATION ADJUSTMENT  . metFORMIN (GLUCOPHAGE) 1000 MG tablet TAKE ONE TABLET BY MOUTH TWO TIMES DAILY  . loratadine (CLARITIN) 10 MG tablet Take 1 tablet (10 mg total) by mouth daily.   No current facility-administered medications on file prior to visit.    Allergies  Allergen Reactions  . Amaryl [Glimepiride] Diarrhea  . Codeine Itching  . Pioglitazone Diarrhea, Itching and Anxiety   PMHx:   Past Medical History:  Diagnosis Date  . Cataract   . Colon polyps   . Diabetes ([place]) 1992  . ED (erectile dysfunction)   . Femur fracture (HCC)    left  . Iron (Fe) deficiency anemia   . Obesity   . Other and unspecified hyperlipidemia   . Premature beats, unspecified   . Unspecified essential hypertension   . Vitamin D deficiency    Immunization History  Administered Date(s) Administered  . Influenza, High Dose Seasonal PF 07/22/2013  . Influenza-Unspecified 06/25/2012  . PPD Test 08/27/2013  . Pneumococcal Conjugate-13 08/22/2011  . Pneumococcal Polysaccharide-23 05/30/2016  . Td 09/17/2002  . Tdap 08/27/2013  . Zoster 09/17/2005   Past Surgical History:  Procedure Laterality Date  . ETT - MYOVIEW     7'45", stopped due to fatigue, no chest pain. EF55%, no ischemia or infarction  . HERNIA REPAIR    .  nissan fundoplication N/A 73/22/0254   FHx:    Reviewed / unchanged  SHx:    Reviewed / unchanged  Systems Review:  Constitutional: Denies fever, chills, wt changes, headaches, insomnia, fatigue, night sweats, change in appetite. Eyes: Denies redness, blurred vision, diplopia, discharge, itchy, watery eyes.  ENT: Denies discharge, congestion, post nasal drip, epistaxis, sore throat, earache, hearing loss, dental pain, tinnitus, vertigo, sinus pain, snoring.  CV: Denies chest pain, palpitations, irregular heartbeat, syncope, dyspnea, diaphoresis, orthopnea, PND, claudication or edema. Respiratory: denies cough,  dyspnea, DOE, pleurisy, hoarseness, laryngitis, wheezing.  Gastrointestinal: Denies dysphagia, odynophagia, heartburn, reflux, water brash, abdominal pain or cramps, nausea, vomiting, bloating, diarrhea, constipation, hematemesis, melena, hematochezia  or hemorrhoids. Genitourinary: Denies dysuria, frequency, urgency, nocturia, hesitancy, discharge, hematuria or flank pain. Musculoskeletal: Denies arthralgias, myalgias, stiffness, jt. swelling, pain, limping or strain/sprain.  Skin: Denies pruritus, rash, hives, warts, acne, eczema or change in skin lesion(s). Neuro: No weakness, tremor, incoordination, spasms, paresthesia or pain. Psychiatric: Denies confusion, memory loss or sensory loss. Endo: Denies change in weight, skin or hair change.  Heme/Lymph: No excessive bleeding, bruising or enlarged lymph nodes.  Physical Exam  BP 136/80   Pulse (!) 54   Temp (!) 97.3 F (36.3 C)   Resp 18   Ht 5\' 8"  (1.727 m)   Wt 203 lb 9.6 oz (92.4 kg)   BMI 30.96 kg/m   Appears well nourished, well groomed  and in no distress.  Eyes: PERRLA, EOMs, conjunctiva no swelling or erythema. Sinuses: No frontal/maxillary tenderness ENT/Mouth: EAC's clear, TM's nl w/o erythema, bulging. Nares clear w/o erythema, swelling, exudates. Oropharynx clear without erythema or exudates. Oral hygiene is good. Tongue normal, non obstructing. Hearing intact.  Neck: Supple. Thyroid nl. Car 2+/2+ without bruits, nodes or JVD. Chest: Respirations nl with BS clear & equal w/o rales, rhonchi, wheezing or stridor.  Cor: Heart sounds normal w/ regular rate and rhythm without sig. murmurs, gallops, clicks or rubs. Peripheral pulses normal and equal  without edema.  Abdomen: Soft & bowel sounds normal. Non-tender w/o guarding, rebound, hernias, masses or organomegaly.  Lymphatics: Unremarkable.  Musculoskeletal: Full ROM all peripheral extremities, joint stability, 5/5 strength and normal gait.  Skin: Warm, dry without exposed  rashes, lesions or ecchymosis apparent.  Neuro: Cranial nerves intact, reflexes equal bilaterally. Sensory-motor testing grossly intact. Tendon reflexes grossly intact.  Pysch: Alert & oriented x 3.  Insight and judgement nl & appropriate. No ideations.  Assessment and Plan:  1. Essential hypertension  - Continue medication, monitor blood pressure at home.  - Continue DASH diet. Reminder to go to the ER if any CP,  SOB, nausea, dizziness, severe HA, changes vision/speech.  - CBC with Differential/Platelet - BASIC METABOLIC PANEL WITH GFR - Magnesium - TSH  2. Hyperlipidemia, mixed  - Continue diet/meds, exercise,& lifestyle modifications.  - Continue monitor periodic cholesterol/liver & renal functions   - Hepatic function panel - Lipid panel - TSH  3. Type 2 diabetes mellitus with stage 2 chronic kidney disease, with long-term current use of insulin (HCC)  - Continue diet, exercise, lifestyle modifications.  - Monitor appropriate labs. - Hemoglobin A1c  4. Vitamin D deficiency  - Continue supplementation.  - VITAMIN D 25 Hydroxy   5. CLL (chronic lymphocytic leukemia)   (HCC)  - CBC with Differential/Platelet  6. Medication management  - CBC with Differential/Platelet - BASIC METABOLIC PANEL WITH GFR - Hepatic function panel - Magnesium - Lipid panel - TSH - Hemoglobin A1c - VITAMIN D  25 Hydroxy       Discussed  regular exercise, BP monitoring, weight control to achieve/maintain BMI less than 25 and discussed med and SE's. Recommended labs to assess and monitor clinical status with further disposition pending results of labs. Over 30 minutes of exam, counseling, chart review was performed.

## 2017-06-09 NOTE — Patient Instructions (Signed)

## 2017-06-10 ENCOUNTER — Ambulatory Visit (INDEPENDENT_AMBULATORY_CARE_PROVIDER_SITE_OTHER): Payer: Medicare Other | Admitting: Internal Medicine

## 2017-06-10 ENCOUNTER — Encounter: Payer: Self-pay | Admitting: Internal Medicine

## 2017-06-10 VITALS — BP 136/80 | HR 54 | Temp 97.3°F | Resp 18 | Ht 68.0 in | Wt 203.6 lb

## 2017-06-10 DIAGNOSIS — E782 Mixed hyperlipidemia: Secondary | ICD-10-CM

## 2017-06-10 DIAGNOSIS — E1122 Type 2 diabetes mellitus with diabetic chronic kidney disease: Secondary | ICD-10-CM | POA: Diagnosis not present

## 2017-06-10 DIAGNOSIS — I10 Essential (primary) hypertension: Secondary | ICD-10-CM

## 2017-06-10 DIAGNOSIS — Z794 Long term (current) use of insulin: Secondary | ICD-10-CM

## 2017-06-10 DIAGNOSIS — E559 Vitamin D deficiency, unspecified: Secondary | ICD-10-CM | POA: Diagnosis not present

## 2017-06-10 DIAGNOSIS — Z23 Encounter for immunization: Secondary | ICD-10-CM | POA: Diagnosis not present

## 2017-06-10 DIAGNOSIS — C9190 Lymphoid leukemia, unspecified not having achieved remission: Secondary | ICD-10-CM | POA: Diagnosis not present

## 2017-06-10 DIAGNOSIS — C9110 Chronic lymphocytic leukemia of B-cell type not having achieved remission: Secondary | ICD-10-CM

## 2017-06-10 DIAGNOSIS — Z79899 Other long term (current) drug therapy: Secondary | ICD-10-CM

## 2017-06-10 DIAGNOSIS — N182 Chronic kidney disease, stage 2 (mild): Secondary | ICD-10-CM | POA: Diagnosis not present

## 2017-06-11 ENCOUNTER — Other Ambulatory Visit: Payer: Self-pay | Admitting: Internal Medicine

## 2017-06-11 DIAGNOSIS — E0590 Thyrotoxicosis, unspecified without thyrotoxic crisis or storm: Secondary | ICD-10-CM

## 2017-06-11 LAB — HEPATIC FUNCTION PANEL
AG Ratio: 1.6 (calc) (ref 1.0–2.5)
ALT: 13 U/L (ref 9–46)
AST: 19 U/L (ref 10–35)
Albumin: 4.2 g/dL (ref 3.6–5.1)
Alkaline phosphatase (APISO): 58 U/L (ref 40–115)
Bilirubin, Direct: 0.2 mg/dL (ref 0.0–0.2)
Globulin: 2.6 g/dL (calc) (ref 1.9–3.7)
Indirect Bilirubin: 0.6 mg/dL (calc) (ref 0.2–1.2)
Total Bilirubin: 0.8 mg/dL (ref 0.2–1.2)
Total Protein: 6.8 g/dL (ref 6.1–8.1)

## 2017-06-11 LAB — LIPID PANEL
Cholesterol: 147 mg/dL (ref <200)
HDL: 28 mg/dL — ABNORMAL LOW (ref >40)
LDL Cholesterol (Calc): 98 mg/dL (calc)
Non-HDL Cholesterol (Calc): 119 mg/dL (calc) (ref <130)
Total CHOL/HDL Ratio: 5.3 (calc) — ABNORMAL HIGH (ref <5.0)
Triglycerides: 114 mg/dL (ref <150)

## 2017-06-11 LAB — CBC WITH DIFFERENTIAL/PLATELET
Basophils Absolute: 162 cells/uL (ref 0–200)
Basophils Relative: 0.4 %
Eosinophils Absolute: 568 cells/uL — ABNORMAL HIGH (ref 15–500)
Eosinophils Relative: 1.4 %
HCT: 36.2 % — ABNORMAL LOW (ref 38.5–50.0)
Hemoglobin: 11.2 g/dL — ABNORMAL LOW (ref 13.2–17.1)
Lymphs Abs: 34307 cells/uL — ABNORMAL HIGH (ref 850–3900)
MCH: 22.5 pg — ABNORMAL LOW (ref 27.0–33.0)
MCHC: 30.9 g/dL — ABNORMAL LOW (ref 32.0–36.0)
MCV: 72.8 fL — ABNORMAL LOW (ref 80.0–100.0)
MPV: 10.3 fL (ref 7.5–12.5)
Monocytes Relative: 5.7 %
Neutro Abs: 3248 cells/uL (ref 1500–7800)
Neutrophils Relative %: 8 %
Platelets: 225 10*3/uL (ref 140–400)
RBC: 4.97 10*6/uL (ref 4.20–5.80)
RDW: 15.4 % — ABNORMAL HIGH (ref 11.0–15.0)
Total Lymphocyte: 84.5 %
WBC mixed population: 2314 cells/uL — ABNORMAL HIGH (ref 200–950)
WBC: 40.6 10*3/uL — ABNORMAL HIGH (ref 3.8–10.8)

## 2017-06-11 LAB — BASIC METABOLIC PANEL WITH GFR
BUN/Creatinine Ratio: 18 (calc) (ref 6–22)
BUN: 21 mg/dL (ref 7–25)
CO2: 29 mmol/L (ref 20–32)
Calcium: 9.3 mg/dL (ref 8.6–10.3)
Chloride: 103 mmol/L (ref 98–110)
Creat: 1.2 mg/dL — ABNORMAL HIGH (ref 0.70–1.18)
GFR, Est African American: 70 mL/min/{1.73_m2} (ref >=60)
GFR, Est Non African American: 60 mL/min/{1.73_m2} (ref >=60)
Glucose, Bld: 100 mg/dL — ABNORMAL HIGH (ref 65–99)
Potassium: 4.6 mmol/L (ref 3.5–5.3)
Sodium: 136 mmol/L (ref 135–146)

## 2017-06-11 LAB — HEMOGLOBIN A1C
Hgb A1c MFr Bld: 8.5 % of total Hgb — ABNORMAL HIGH (ref <5.7)
Mean Plasma Glucose: 197 (calc)
eAG (mmol/L): 10.9 (calc)

## 2017-06-11 LAB — VITAMIN D 25 HYDROXY (VIT D DEFICIENCY, FRACTURES): Vit D, 25-Hydroxy: 38 ng/mL (ref 30–100)

## 2017-06-11 LAB — TSH: TSH: 0.39 mIU/L — ABNORMAL LOW (ref 0.40–4.50)

## 2017-06-11 LAB — MAGNESIUM: Magnesium: 1.9 mg/dL (ref 1.5–2.5)

## 2017-06-26 ENCOUNTER — Other Ambulatory Visit (HOSPITAL_BASED_OUTPATIENT_CLINIC_OR_DEPARTMENT_OTHER): Payer: Medicare Other

## 2017-06-26 ENCOUNTER — Ambulatory Visit (HOSPITAL_BASED_OUTPATIENT_CLINIC_OR_DEPARTMENT_OTHER): Payer: Medicare Other | Admitting: Oncology

## 2017-06-26 ENCOUNTER — Telehealth: Payer: Self-pay | Admitting: Oncology

## 2017-06-26 VITALS — BP 122/56 | HR 53 | Temp 98.4°F | Resp 18 | Ht 68.0 in | Wt 203.9 lb

## 2017-06-26 DIAGNOSIS — C9110 Chronic lymphocytic leukemia of B-cell type not having achieved remission: Secondary | ICD-10-CM

## 2017-06-26 LAB — COMPREHENSIVE METABOLIC PANEL
ALT: 13 U/L (ref 0–55)
AST: 20 U/L (ref 5–34)
Albumin: 3.9 g/dL (ref 3.5–5.0)
Alkaline Phosphatase: 66 U/L (ref 40–150)
Anion Gap: 8 mEq/L (ref 3–11)
BUN: 15.8 mg/dL (ref 7.0–26.0)
CO2: 28 mEq/L (ref 22–29)
Calcium: 9.6 mg/dL (ref 8.4–10.4)
Chloride: 104 mEq/L (ref 98–109)
Creatinine: 1.1 mg/dL (ref 0.7–1.3)
EGFR: >60 mL/min/{1.73_m2} (ref >60)
Glucose: 81 mg/dl (ref 70–140)
Potassium: 4.8 mEq/L (ref 3.5–5.1)
Sodium: 139 mEq/L (ref 136–145)
Total Bilirubin: 0.65 mg/dL (ref 0.20–1.20)
Total Protein: 7.4 g/dL (ref 6.4–8.3)

## 2017-06-26 LAB — LACTATE DEHYDROGENASE: LDH: 182 U/L (ref 125–245)

## 2017-06-26 NOTE — Telephone Encounter (Signed)
Gave avs and calendar for April 2019 °

## 2017-06-26 NOTE — Progress Notes (Signed)
Hematology and Oncology Follow Up Visit  Nathan Davis [id number] 04-01-1946 71 y.o. 06/26/2017 8:31 AM Nathan Davis, MDMcKeown, [name], MD   Principle Diagnosis: 71 year old gentleman with stage 0 CLL presented with lymphocytosis. His flow cytometry in April 2017 confirmed this diagnosis.    Current therapy: Observation and surveillance for his CLL.   Interim History:  Nathan Davis presents today for a follow-up visit. Since the last visit, he continues to do well without any issues. He denied any lymphadenopathy or excessive fatigue. He remains active and attends to activities of daily living. He denied any fevers, chills or sweats. He is intentionally trying to lose weight with exercising regularly. He denied any hematochezia or melena. He is up-to-date on his colonoscopies. He denied any recent hospitalization or illnesses.  He does not report any headaches, blurry vision, syncope or seizures.Does not report any chest pain, palpitation, orthopnea or leg edema. He does not report any cough, wheezing or hemoptysis. Does not report any nausea, vomiting or abdominal pain. Does not report any constipation, diarrhea, hematochezia or melena. He does not report any frequency urgency or hesitancy. He does not report any skeletal complaints. Remaining review of systems unremarkable.   Medications: I have reviewed the patient's current medications.  Current Outpatient Prescriptions  Medication Sig Dispense Refill  . aspirin 81 MG tablet Take 81 mg by mouth daily.    Marland Kitchen atenolol (TENORMIN) 25 MG tablet TAKE ONE TABLET BY MOUTH AT BEDTIME 90 tablet 1  . atorvastatin (LIPITOR) 80 MG tablet TAKE ONE TABLET BY MOUTH DAILY 90 tablet 0  . dapagliflozin propanediol (FARXIGA) 10 MG TABS tablet Take 10 mg by mouth daily. 28 tablet 0  . Insulin Detemir (LEVEMIR FLEXTOUCH) 100 UNIT/ML Pen INJECT 10 UNITS INTO THE SKIN AT BEDTIME, OR AS DIRECTED 15 mL 5  . Insulin Pen Needle (B-D ULTRAFINE III SHORT  PEN) 31G X 8 MM MISC CHECK BLOOD SUGAR TWICE DAILY FOR FLUCTUATIONS AND MEDICATION ADJUSTMENT 100 each 2  . loratadine (CLARITIN) 10 MG tablet Take 1 tablet (10 mg total) by mouth daily. 30 tablet 2  . metFORMIN (GLUCOPHAGE) 1000 MG tablet TAKE ONE TABLET BY MOUTH TWO TIMES DAILY 180 tablet 1   No current facility-administered medications for this visit.      Allergies:  Allergies  Allergen Reactions  . Amaryl [Glimepiride] Diarrhea  . Codeine Itching  . Pioglitazone Diarrhea, Itching and Anxiety    Past Medical History, Surgical history, Social history, and Family History were reviewed and updated.   Physical Exam: Blood pressure (!) 122/56, pulse (!) 53, temperature 98.4 F (36.9 C), temperature source Oral, resp. rate 18, height 5\' 8"  (1.727 m), weight 203 lb 14.4 oz (92.5 kg), SpO2 99 %. ECOG: 0 General appearance: Well-appearing gentleman without distress. Head: Normocephalic, without obvious abnormality no oral ulcers or lesions. Neck: no adenopathy or masses. Lymph nodes: Cervical, supraclavicular, and axillary nodes normal. Heart:regular rate and rhythm, S1, S2 normal, no murmur, click, rub or gallop Lung:chest clear, no wheezing, rales, normal symmetric air entry.  Abdomin: soft, non-tender, without masses or organomegaly no rebound or guarding. EXT:no erythema, induration, or nodules   Lab Results: Lab Results  Component Value Date   WBC 40.6 (H) 06/10/2017   HGB 11.2 (L) 06/10/2017   HCT 36.2 (L) 06/10/2017   MCV 72.8 (L) 06/10/2017   PLT 225 06/10/2017     Chemistry      Component Value Date/Time   NA 136 06/10/2017 1043   NA 139 12/26/2016 0806  K 4.6 06/10/2017 1043   K 4.9 12/26/2016 0806   CL 103 06/10/2017 1043   CO2 29 06/10/2017 1043   CO2 28 12/26/2016 0806   BUN 21 06/10/2017 1043   BUN 20.4 12/26/2016 0806   CREATININE 1.20 (H) 06/10/2017 1043   CREATININE 1.2 12/26/2016 0806      Component Value Date/Time   CALCIUM 9.3 06/10/2017 1043    CALCIUM 10.0 12/26/2016 0806   ALKPHOS 72 01/09/2017 1002   ALKPHOS 99 12/26/2016 0806   AST 19 06/10/2017 1043   AST 16 12/26/2016 0806   ALT 13 06/10/2017 1043   ALT 16 12/26/2016 0806   BILITOT 0.8 06/10/2017 1043   BILITOT 0.62 12/26/2016 0806       Impression and Plan:   71 year old gentleman with the following issues:  1. CLL diagnosed in April 2017. He is currently on observation and surveillance without any changes in his clinical status. He presented with lymphocytosis without any lymphadenopathy or symptoms.   His CBC obtained on 06/10/2017 showed a white cell count of 40,000 with a lymphocyte percentage not dramatically different than previous counts. He is completely asymptomatic at this time without any indication for treatment.  The natural course of this disease was reviewed again today. He does not have any indication for treatment and I recommended continued observation and surveillance. His white cell count will continue to slowly over the next few years and as long as he is asymptomatic, no indication for treatment is wanted.   2. Iron deficiency anemia: He is up-to-date on colonoscopies and Hemoccult testing is negative.  Studies in April 2018 is normalized.  3. Autoimmune considerations: He is at risk of developing autoimmune anemia and thrombocytopenia no findings noted at this time.  4. Infectious considerations: He is at risk of infection because of immune dysregulation related to CLL. No infectious issues noted.  5. Follow-up: Will be in 6 months sooner if needed to.   Zola Button, MD 10/10/20188:31 AM

## 2017-07-03 ENCOUNTER — Other Ambulatory Visit: Payer: Self-pay | Admitting: Internal Medicine

## 2017-07-15 ENCOUNTER — Ambulatory Visit (INDEPENDENT_AMBULATORY_CARE_PROVIDER_SITE_OTHER): Payer: Medicare Other

## 2017-07-15 DIAGNOSIS — E0590 Thyrotoxicosis, unspecified without thyrotoxic crisis or storm: Secondary | ICD-10-CM

## 2017-07-15 LAB — TSH: TSH: 0.44 mIU/L (ref 0.40–4.50)

## 2017-07-15 NOTE — Progress Notes (Signed)
Patient presents to the office for lab work checking thyroid levels. Patient not currently on thyroid medication.

## 2017-07-25 ENCOUNTER — Other Ambulatory Visit: Payer: Self-pay | Admitting: Physician Assistant

## 2017-09-06 ENCOUNTER — Other Ambulatory Visit: Payer: Self-pay | Admitting: *Deleted

## 2017-09-06 MED ORDER — LORATADINE 10 MG PO TABS: 10.0000 mg | ORAL_TABLET | Freq: Every day | ORAL | 1 refills | Status: DC

## 2017-09-11 ENCOUNTER — Other Ambulatory Visit: Payer: Self-pay | Admitting: Internal Medicine

## 2017-09-11 MED ORDER — LORATADINE 10 MG PO TABS: ORAL_TABLET | ORAL | 1 refills | Status: DC

## 2017-09-15 ENCOUNTER — Encounter (HOSPITAL_COMMUNITY): Payer: Self-pay | Admitting: Emergency Medicine

## 2017-09-15 ENCOUNTER — Emergency Department (HOSPITAL_COMMUNITY)
Admission: EM | Admit: 2017-09-15 | Discharge: 2017-09-15 | Disposition: A | Discharge status: Home / Self Care | Payer: Medicare Other | Attending: Emergency Medicine | Admitting: Emergency Medicine

## 2017-09-15 ENCOUNTER — Other Ambulatory Visit: Payer: Self-pay

## 2017-09-15 DIAGNOSIS — Z7982 Long term (current) use of aspirin: Secondary | ICD-10-CM | POA: Diagnosis not present

## 2017-09-15 DIAGNOSIS — M545 Low back pain: Secondary | ICD-10-CM | POA: Diagnosis present

## 2017-09-15 DIAGNOSIS — E119 Type 2 diabetes mellitus without complications: Secondary | ICD-10-CM | POA: Diagnosis not present

## 2017-09-15 DIAGNOSIS — Z79899 Other long term (current) drug therapy: Secondary | ICD-10-CM | POA: Insufficient documentation

## 2017-09-15 DIAGNOSIS — Z794 Long term (current) use of insulin: Secondary | ICD-10-CM | POA: Insufficient documentation

## 2017-09-15 DIAGNOSIS — I10 Essential (primary) hypertension: Secondary | ICD-10-CM | POA: Insufficient documentation

## 2017-09-15 MED ORDER — OXYCODONE-ACETAMINOPHEN 5-325 MG PO TABS: 1.0000 | ORAL_TABLET | ORAL | 0 refills | Status: DC | PRN

## 2017-09-15 MED ORDER — LORAZEPAM 0.5 MG PO TABS
0.5000 mg | ORAL_TABLET | Freq: Once | ORAL | Status: AC
  Administered 2017-09-15: 0.5 mg via ORAL
  Filled 2017-09-15: qty 1

## 2017-09-15 MED ORDER — KETOROLAC TROMETHAMINE 15 MG/ML IJ SOLN
15.0000 mg | Freq: Once | INTRAMUSCULAR | Status: AC
  Administered 2017-09-15: 15 mg via INTRAMUSCULAR
  Filled 2017-09-15: qty 1

## 2017-09-15 MED ORDER — DIAZEPAM 5 MG PO TABS: 2.5000 mg | ORAL_TABLET | Freq: Three times a day (TID) | ORAL | 0 refills | Status: DC | PRN

## 2017-09-15 MED ORDER — DEXAMETHASONE 4 MG PO TABS
8.0000 mg | ORAL_TABLET | Freq: Once | ORAL | Status: AC
  Administered 2017-09-15: 8 mg via ORAL
  Filled 2017-09-15: qty 2

## 2017-09-15 MED ORDER — GABAPENTIN 100 MG PO CAPS
100.0000 mg | ORAL_CAPSULE | Freq: Once | ORAL | Status: AC
  Administered 2017-09-15: 100 mg via ORAL
  Filled 2017-09-15: qty 1

## 2017-09-15 MED ORDER — HYDROMORPHONE HCL 1 MG/ML IJ SOLN
1.0000 mg | Freq: Once | INTRAMUSCULAR | Status: AC
  Administered 2017-09-15: 1 mg via INTRAMUSCULAR
  Filled 2017-09-15: qty 1

## 2017-09-15 MED ORDER — MELOXICAM 7.5 MG PO TABS: 7.5000 mg | ORAL_TABLET | Freq: Two times a day (BID) | ORAL | 0 refills | Status: DC

## 2017-09-15 NOTE — ED Notes (Signed)
I have just assisted him into a standing position. He finds it remarkable that he was able to stand, whereas, prior to being medicated he was utterly unable to do so. His family remain with him. As I write this, Dr. Wilson Singer is speaking with them.

## 2017-09-15 NOTE — ED Provider Notes (Signed)
[facility] DEPT Provider Note   CSN: [id number] Arrival date & time: 09/15/17  9381     History   Chief Complaint Chief Complaint  Patient presents with  . Back Pain    HPI CAEDEN Davis is a 71 y.o. male.  HPI   71 year old male with lower back pain.  First noticed the pain the day after raking leaves.  This was a few weeks ago before the recent snow.  Pain has progressively worsened since that time to the point that he now needs assistance in getting up from seated position.  Pain is tolerable when still.  Pain radiates from the lower back into the proximal lower extremities.  Sometimes gets a numbing sensation in the same distribution.  No weakness.  No urinary complaints.  Denies any past history of back pain.  No fevers or chills.  Is not anticoagulated.  No previous back surgery.  Past Medical History:  Diagnosis Date  . Cataract   . Colon polyps   . Diabetes ([place]) 1992  . ED (erectile dysfunction)   . Femur fracture ([facility])    left  . Iron (Fe) deficiency anemia   . Obesity   . Other and unspecified hyperlipidemia   . Premature beats, unspecified   . Unspecified essential hypertension   . Vitamin D deficiency     Patient Active Problem List   Diagnosis Date Noted  . 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 Insulin-requiring or dependent type II diabetes mellitus ([place]) 05/26/2017  . CLL (chronic lymphocytic leukemia) ([place]) 10/09/2016  . Hyperlipemia 02/23/2016  . Encounter for Medicare annual wellness exam 11/15/2015  . PVC (premature ventricular contraction) 02/04/2014  . Essential hypertension 12/09/2013  . Vitamin D deficiency 12/09/2013  . Medication management 12/09/2013  . 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 ETT - MYOVIEW     7'45", stopped due to fatigue, no chest pain. EF55%, no ischemia or infarction  . HERNIA REPAIR    .  nissan fundoplication N/A 96/78/9381       Home Medications    Prior to Admission medications   Medication Sig Start Date End Date Taking? Authorizing Provider  aspirin 81 MG tablet Take 81 mg by mouth daily.    [provider]  atenolol (TENORMIN) 25 MG tablet TAKE ONE TABLET BY MOUTH AT BEDTIME 07/03/17   [name], MD  atorvastatin (LIPITOR) 80 MG tablet TAKE ONE TABLET BY MOUTH DAILY 07/25/17   Vicie Mutters, PA-C  dapagliflozin propanediol (FARXIGA) 10 MG TABS tablet Take 10 mg by mouth daily. 03/27/16   [name], MD  Insulin Detemir (LEVEMIR FLEXTOUCH) 100 UNIT/ML Pen INJECT 10 UNITS INTO THE SKIN AT BEDTIME, OR AS DIRECTED 05/01/17   Vicie Mutters, PA-C  Insulin Pen Needle (B-D ULTRAFINE III SHORT PEN) 31G X 8 MM MISC CHECK BLOOD SUGAR TWICE DAILY FOR FLUCTUATIONS AND MEDICATION ADJUSTMENT 05/01/17   Vicie Mutters, PA-C  loratadine (CLARITIN) 10 MG tablet Take 1 tablet daily for Allergies 09/11/17   [name], MD  metFORMIN (GLUCOPHAGE) 1000 MG tablet TAKE ONE TABLET BY MOUTH TWO TIMES DAILY 03/16/17   [name], MD    Family History Family History  Problem Relation Age of Onset  . Throat cancer Mother   . Alzheimer's disease Father   . Hypertension Unknown        family history  . Heart attack Unknown 79  Social History Social History   Tobacco Use  . Smoking status: Never Smoker  . Smokeless tobacco: Never Used  Substance Use Topics  . Alcohol use: No  . Drug use: No     Allergies   Amaryl [glimepiride]; Codeine; and Pioglitazone   Review of Systems Review of Systems  All systems reviewed and negative, other than as noted in HPI.  Physical Exam Updated Vital Signs BP 136/75   Pulse 90   Temp 98.3 F (36.8 C) (Oral)   Resp 18   SpO2 98%   Physical Exam  Constitutional: He appears well-developed and well-nourished. No distress.  HENT:  Head: Normocephalic and atraumatic.  Eyes: Conjunctivae are normal.  Right eye exhibits no discharge. Left eye exhibits no discharge.  Neck: Neck supple.  Cardiovascular: Normal rate, regular rhythm and normal heart sounds. Exam reveals no gallop and no friction rub.  No murmur heard. Pulmonary/Chest: Effort normal and breath sounds normal. No respiratory distress.  Abdominal: Soft. He exhibits no distension. There is no tenderness.  Musculoskeletal: He exhibits no edema or tenderness.  Back is normal to inspection.  The lumbar region both paraspinally and in the midline he has diffuse tenderness.  Strength is 5 out of 5 bilateral lower extremities although range of motion is limited secondary to pain.  Sensation is intact to light touch.  Neurological: He is alert.  Skin: Skin is warm and dry.  Psychiatric: He has a normal mood and affect. His behavior is normal. Thought content normal.  Nursing note and vitals reviewed.    ED Treatments / Results  Labs (all labs ordered are listed, but only abnormal results are displayed) Labs Reviewed - No data to display  EKG  EKG Interpretation None       Radiology No results found.  Procedures Procedures (including critical care time)  Medications Ordered in ED Medications  ketorolac (TORADOL) 15 MG/ML injection 15 mg (not administered)  dexamethasone (DECADRON) tablet 8 mg (not administered)  gabapentin (NEURONTIN) capsule 100 mg (not administered)  HYDROmorphone (DILAUDID) injection 1 mg (not administered)  LORazepam (ATIVAN) tablet 0.5 mg (not administered)     Initial Impression / Assessment and Plan / ED Course  I have reviewed the triage vital signs and the nursing notes.  Pertinent labs & imaging results that were available during my care of the patient were reviewed by me and considered in my medical decision making (see chart for details).     Lower back pain.  Nonfocal neurological examination.  He was treated symptomatically with steady improvement of his symptoms.  Plan continue  symptom medic treatment.  Doubt emergent process.  Return precautions were discussed.  Final Clinical Impressions(s) / ED Diagnoses   Final diagnoses:  Acute low back pain, unspecified back pain laterality, with sciatica presence unspecified    ED Discharge Orders    None       Virgel Manifold, MD 09/17/17 [phone]

## 2017-09-15 NOTE — ED Notes (Signed)
Patient sitting in wheelchair. Patient states that its better than the bed and request to stay in the chair. Patient was instructed to call out if he starts feeling dizzy or sleepy and we can transfer him to the bed. Patient is also on the monitor.

## 2017-09-15 NOTE — ED Triage Notes (Signed)
Pt states he is having lower back pain that started a few weeks ago after he was raking leaves  Pt states the pain has gotten worse over the past 24-48 hrs  Pt states it is hard for him to get around  Pt states the pain radiates down into both his legs

## 2017-09-18 ENCOUNTER — Ambulatory Visit: Payer: Medicare Other | Admitting: Internal Medicine

## 2017-09-18 ENCOUNTER — Other Ambulatory Visit: Payer: Self-pay | Admitting: Physician Assistant

## 2017-09-18 VITALS — BP 112/72 | HR 72 | Temp 97.7°F | Resp 18 | Ht 68.0 in | Wt 198.2 lb

## 2017-09-18 DIAGNOSIS — M545 Low back pain: Principal | ICD-10-CM

## 2017-09-18 DIAGNOSIS — G8929 Other chronic pain: Secondary | ICD-10-CM

## 2017-09-18 MED ORDER — LORATADINE 10 MG PO TABS: ORAL_TABLET | ORAL | 1 refills | Status: AC

## 2017-09-18 NOTE — Progress Notes (Signed)
      Patient is a nice 72 yo MBM with hx/o gradual onset of LBP occurring over several weeks which he dates onset to yard work, Freight forwarder leaves, [employer] research [employer], [employer]. 2 days ago he went toi ER and was dx'd w/muscle strain and was given im Toradol, Dilaudid and Decadron and discharged w/Rx Valium and Oxycodone. No imaging was done. Patient relates difficulty standing w/o assistance due to back pain. Denies sciatica.   BP 112/72   Pulse 72   Temp 97.7 F (36.5 C)   Resp 18   Ht 5\' 8"  (1.727 m)   Wt 198 lb 3.2 oz (89.9 kg)   BMI 30.14 kg/m   Brief exam find patient able to stand with 1+ assistance. Has (+) tender Rt>LT para lumbar tenderness. (-) SLR bilat.   IMP: LBP , DDx of comp fx, muscle strain, spinal stenosis , etc    Recc Orthopedic eval and patient referred to Dr Mayer Camel for imaging and further disposition  (No Charge OV today for referral)

## 2017-09-18 NOTE — Progress Notes (Signed)
Future Appointments  Date Time Provider [place]  09/25/2017  9:30 AM Liane Comber, NP [facility] None  12/25/2017  8:30 AM [facility] 1 [facility] None  12/25/2017  9:00 AM Wyatt Portela, MD [facility] None  05/29/2018  9:00 AM [name], MD [facility] None

## 2017-09-25 ENCOUNTER — Ambulatory Visit: Payer: Self-pay | Admitting: Adult Health

## 2017-10-14 ENCOUNTER — Encounter: Payer: Self-pay | Admitting: Physician Assistant

## 2017-10-14 ENCOUNTER — Ambulatory Visit: Payer: Medicare Other | Admitting: Physician Assistant

## 2017-10-14 VITALS — BP 126/80 | Temp 98.6°F | Resp 16 | Ht 68.0 in | Wt 195.0 lb

## 2017-10-14 DIAGNOSIS — C9190 Lymphoid leukemia, unspecified not having achieved remission: Secondary | ICD-10-CM

## 2017-10-14 DIAGNOSIS — M545 Low back pain, unspecified: Secondary | ICD-10-CM

## 2017-10-14 DIAGNOSIS — K4190 Unilateral femoral hernia, without obstruction or gangrene, not specified as recurrent: Secondary | ICD-10-CM | POA: Diagnosis not present

## 2017-10-14 DIAGNOSIS — C9110 Chronic lymphocytic leukemia of B-cell type not having achieved remission: Secondary | ICD-10-CM

## 2017-10-14 DIAGNOSIS — M79605 Pain in left leg: Secondary | ICD-10-CM | POA: Diagnosis not present

## 2017-10-14 DIAGNOSIS — R350 Frequency of micturition: Secondary | ICD-10-CM | POA: Diagnosis not present

## 2017-10-14 MED ORDER — OXYCODONE-ACETAMINOPHEN 5-325 MG PO TABS
1.0000 | ORAL_TABLET | ORAL | 0 refills | Status: DC | PRN
Start: 2017-10-14 — End: 2018-01-31

## 2017-10-14 MED ORDER — MUELLER ADJUSTABLE BACK BRACE MISC: 1.0000 | Freq: Once | 0 refills | Status: AC

## 2017-10-14 NOTE — Progress Notes (Signed)
Assessment & Plan:    Low back pain radiating to left lower extremity Tramadol is not helping pain, will give SHORT 5 day course of oxycodone and have patient follow up with Dr. Mayer Camel ? Benefit from kyphoplasty -     Elevated toilet seat -     oxyCODONE-acetaminophen (PERCOCET/ROXICET) 5-325 MG tablet; Take 1-2 tablets by mouth every 4 (four) hours as needed for severe pain. -     Elastic Bandages & Supports (MUELLER ADJUSTABLE BACK BRACE) MISC; 1 applicator by Does not apply route once for 1 dose.  Non-recurrent unilateral femoral hernia without obstruction or gangrene -     Ambulatory referral to General Surgery - versus having to do with lympoma/CLL following up with Dr. Alen Blew  CLL (chronic lymphocytic leukemia) [facility]) With retroperitoneal adenopathy on MRI, will refer back to Dr. Alen Blew for possible lymphoma evaluation  Urinary frequency -     Urinalysis, Routine w reflex microscopic -     Urine Culture   Subjective:    Patient ID: Nathan Davis, male    DOB: November 22, 1945, 72 y.o.   MRN: [id number]  HPI 72 y.o. AAM with history of HTN, DM2, chol, CLL presents with back pain/AB pain. He is having LLQ pain and back pain, with pain down his legs. He went to the ER 12/20 and saw Dr. Melford Aase 09/18/2017, and he has seen ortho, Dr. Mayer Camel. He is on tramadol.  He has tingling in his leg, having to pick up his leg to get in his truck. He states he denies incontinence, having yellow urine, more frequency and urgency.  He states due to his back pain he can not get off the toilet recently, having a hard time getting up from chairs.   Got Dr. Damita Dunnings note, MRI showed L2 compression fracture with edema and retroperitoneal adenopathy.   He states last night he started to have LLQ swelling with discomfort, states feels like the a hernia he has had on the other side that was repaired. He denies any fever, chills, no changes in bowel/bladder habits. No diarrhea, constipation, incontinence.    Blood pressure 126/80, temperature 98.6 F (37 C), resp. rate 16, height 5\' 8"  (1.727 m), weight 195 lb (88.5 kg).  Medications Current Outpatient Medications on File Prior to Visit  Medication Sig  . aspirin 81 MG tablet Take 81 mg by mouth daily.  Marland Kitchen atenolol (TENORMIN) 25 MG tablet TAKE ONE TABLET BY MOUTH AT BEDTIME  . atorvastatin (LIPITOR) 80 MG tablet TAKE ONE TABLET BY MOUTH DAILY  . dapagliflozin propanediol (FARXIGA) 10 MG TABS tablet Take 10 mg by mouth daily.  . diazepam (VALIUM) 5 MG tablet Take 0.5 tablets (2.5 mg total) by mouth every 8 (eight) hours as needed for muscle spasms.  . Insulin Detemir (LEVEMIR FLEXTOUCH) 100 UNIT/ML Pen INJECT 10 UNITS INTO THE SKIN AT BEDTIME, OR AS DIRECTED  . Insulin Pen Needle (B-D ULTRAFINE III SHORT PEN) 31G X 8 MM MISC CHECK BLOOD SUGAR TWICE DAILY FOR FLUCTUATIONS AND MEDICATION ADJUSTMENT  . loratadine (CLARITIN) 10 MG tablet Take 1 tablet daily for Allergies  . meloxicam (MOBIC) 7.5 MG tablet Take 1 tablet (7.5 mg total) by mouth 2 (two) times daily.  . metFORMIN (GLUCOPHAGE) 1000 MG tablet TAKE ONE TABLET BY MOUTH TWO TIMES DAILY   No current facility-administered medications on file prior to visit.     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  Review of Systems  Constitutional: Negative.  Negative for chills, diaphoresis, fatigue and fever.  HENT: Negative.   Eyes: Negative.   Respiratory: Negative.   Cardiovascular: Negative.   Gastrointestinal: Negative.   Endocrine: Negative.   Genitourinary: Positive for frequency and urgency.  Musculoskeletal: Positive for back  pain and gait problem. Negative for arthralgias, joint swelling, myalgias, neck pain and neck stiffness.  Skin: Negative.  Negative for rash.  Neurological: Positive for numbness (left leg).  Hematological: Negative.        Objective:   Physical Exam  Abdominal: Soft. Bowel sounds are normal. He exhibits no distension. There is no tenderness. There is CVA tenderness. A hernia is present. Hernia confirmed positive in the left inguinal area (or femoral, tender, nonretractable).  Musculoskeletal:  Patient with decreased movement due to back pain, he needs help coming to a stand, is unsteady on his feet. He has good sensation bilateral legs, difficult to get reflexes with him sitting in a chair, 3 or 4/5 strength bilateral legs.       Vicie Mutters, PA-C

## 2017-10-14 NOTE — Patient Instructions (Addendum)
Follow up Dr. Mayer Camel for your back pain Phone: [phone];  Call to make an appointment with Dr. Alen Blew Phone: [phone];    Spinal Compression Fracture A spinal compression fracture is a collapse of the bones that form the spine (vertebrae). With this type of fracture, the vertebrae become squashed (compressed) into a wedge shape. Most compression fractures happen in the middle or lower part of the spine. What are the causes? This condition may be caused by:  Thinning and loss of density in the bones (osteoporosis). This is the most common cause.  A fall.  A car or motorcycle accident.  Cancer.  Trauma, such as a heavy, direct hit to the head.  What increases the risk? You may be at greater risk for a spinal compression fracture if you:  Are 71 years old or older.  Have osteoporosis.  Have certain types of cancer, including: ? Multiple myeloma. ? Lymphoma. ? Prostate cancer. ? Lung cancer. ? Breast cancer.  What are the signs or symptoms? Symptoms of this condition include:  Severe pain.  Pain that gets worse over time.  Pain that is worse when you stand, walk, sit, or bend.  Sudden pain that is so bad that it is hard for you to move.  Bending or humping of the spine.  Gradual loss of height.  Numbness, tingling, or weakness in the back and legs.  Trouble walking.  Your symptoms will depend on the cause of the fracture and how quickly it develops. For example, fractures that are caused by osteoporosis can cause few symptoms, no symptoms, or symptoms that develop slowly over time. How is this diagnosed? This condition may be diagnosed based on symptoms, medical history, and a physical exam. During the physical exam, your health care provider may tap along the length of your spine to check for tenderness. Tests may be done to confirm the diagnosis. They may include:  A bone density test to check for osteoporosis.  Imaging tests, such as a spine X-ray,  a CT scan, or MRI.  How is this treated? Treatment for this condition depends on the cause and severity of the condition.Some fractures, such as those that are caused by osteoporosis, may heal on their own with supportive care. This may include:  Pain medicine.  Rest.  A back brace.  Physical therapy exercises.  Medicine that reduces bone pain.  Calcium and vitamin D supplements.  Fractures that cause the back to become misshapen, cause nerve pain or weakness, or do not respond to other treatment may be treated with a surgical procedure, such as:  Vertebroplasty. In this procedure, bone cement is injected into the collapsed vertebrae to stabilize them.  Balloon kyphoplasty. In this procedure, the collapsed vertebrae are expanded with a balloon and then bone cement is injected into them.  Spinal fusion. In this procedure, the collapsed vertebrae are connected (fused) to normal vertebrae.  Follow these instructions at home: General instructions  Take medicines only as directed by your health care provider.  Do not drive or operate heavy machinery while taking pain medicine.  If directed, apply ice to the injured area: ? Put ice in a plastic bag. ? Place a towel between your skin and the bag. ? Leave the ice on for 30 minutes every two hours at first. Then apply the ice as needed.  Wear your neck brace or back brace as directed by your health care provider.  Do not drink alcohol. Alcohol can interfere with your treatment.  Keep all  follow-up visits as directed by your health care provider. This is important. It can help to prevent permanent injury, disability, and long-lasting (chronic) pain. Activity  Stay in bed (on bed rest) only as directed by your health care provider. Being on bed rest for too long can make your condition worse.  Return to your normal activities as directed by your health care provider. Ask what activities are safe for you.  Do exercises to improve  motion and strength in your back (physical therapy), as recommended by your health care provider.  Exercise regularly as directed by your health care provider. Contact a health care provider if:  You have a fever.  You develop a cough that makes your pain worse.  Your pain medicine is not helping.  Your pain does not get better over time.  You cannot return to your normal activities as planned or expected. Get help right away if:  Your pain is very bad and it suddenly gets worse.  You are unable to move any body part (paralysis) that is below the level of your injury.  You have numbness, tingling, or weakness in any body part that is below the level of your injury.  You cannot control your bladder or bowels. This information is not intended to replace advice given to you by your health care provider. Make sure you discuss any questions you have with your health care provider. Document Released: 09/03/2005 Document Revised: 05/01/2016 Document Reviewed: 09/07/2014 Elsevier Interactive Patient Education  [employer].

## 2017-10-15 ENCOUNTER — Encounter: Payer: Self-pay | Admitting: Internal Medicine

## 2017-10-15 LAB — URINALYSIS, ROUTINE W REFLEX MICROSCOPIC
Bilirubin Urine: NEGATIVE
Glucose, UA: NEGATIVE
Hgb urine dipstick: NEGATIVE
Leukocytes, UA: NEGATIVE
Nitrite: NEGATIVE
Protein, ur: NEGATIVE
Specific Gravity, Urine: 1.02 (ref 1.001–1.03)
pH: <=5 (ref 5.0–8.0)

## 2017-10-15 LAB — URINE CULTURE
MICRO NUMBER:: 90118879
SPECIMEN QUALITY:: ADEQUATE

## 2017-10-15 NOTE — Progress Notes (Signed)
FOLLOW UP  Assessment and Plan:   Hypertension Well controlled with current medications  Monitor blood pressure at home; patient to call if consistently greater than 130/80 Continue DASH diet.   Reminder to go to the ER if any CP, SOB, nausea, dizziness, severe HA, changes vision/speech, left arm numbness and tingling and jaw pain.  Cholesterol Currently above goal; discussed pending results tomorrow increase back up to daily dose.  Continue low cholesterol diet and exercise.  Check lipid panel.   Diabetes with diabetic chronic kidney disease Continue medications - metformin 1000 mg BID, levemir 25 units daily -  patient is working on weight loss - if A1Cs do not continue to improve, switch from basal insulin to Novolog 70/30 has been discussed Continue diet and exercise.  Perform daily foot/skin check, notify office of any concerning changes.  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 Alen Blew for possible lymphoma CBC   Stage 2 CKD assoc. With T2DM Increase fluids, avoid NSAIDS, monitor sugars, will monitor Check microalbumin annually BMP with GFR  Continue diet and meds as discussed. Further disposition pending results of labs. Discussed med's effects and SE's.   Over 30 minutes of exam, counseling, chart review, and critical decision making was performed.   Future Appointments  Date Time Provider [place]  12/25/2017  8:30 AM [facility] 1 [facility] None  12/25/2017  9:00 AM  Shadad, Mathis Dad, MD [facility] None  05/29/2018  9:00 AM [name], MD [facility] None    ----------------------------------------------------------------------------------------------------------------------  HPI 72 y.o. AA male  presents accompanied by his son for 3 month follow up on hypertension, cholesterol, diabetes, weight and vitamin D deficiency. He also has CLL on active monitoring by Dr. Alen Blew. He has been seen multiple times recently for back pain - on review of lumbar MRI 09/23/2017, retroperitoneal adenopathy was noted and he has been referred back to Dr. Alen Blew for evaluation for possible lymphoma. He has been prescribed back brace and high-seat commode and has appointment with Dr. Mayer Camel for back pain. He is also pending eval by Dr. Hassell Done for new L inguinal hernia.   BMI is Body mass index is 29.95 kg/m., he has been working on diet - but has not been exercising.  Wt Readings from Last 3 Encounters:  10/16/17 197 lb (89.4 kg)  10/14/17 195 lb (88.5 kg)  09/18/17 198 lb 3.2 oz (89.9 kg)   His blood pressure has been controlled at home, today their BP is BP: 118/72  He does not workout. He denies chest pain, shortness of breath, dizziness.   He is on cholesterol medication (atorvastatin 40 mg every other day) and denies myalgias. His cholesterol is not at goal. The cholesterol last visit was:   Lab Results  Component Value Date   CHOL 147 06/10/2017   HDL 28 (L) 06/10/2017   LDLCALC 145 (H) 01/09/2017   TRIG 114 06/10/2017   CHOLHDL 5.3 (H) 06/10/2017    He has been working on diet for T2 diabetes, and  denies increased appetite, nausea, paresthesia of the feet, polydipsia, polyuria, visual disturbances, vomiting and weight loss. He is currently treated by metformin 1000 mg BID, levemir 25 units daily. He does check daily fasting sugar - runs 145-160. He has not had hypoglycemic episode in several years. Possible benefit of switch to novolog 70/30 has been discussed but  postponed per strong patient preference.  Last A1C in the office was significantly improved from previous:  Lab Results  Component Value Date   HGBA1C 8.5 (H) 06/10/2017   Patient was well below goal of 70 at last check; he reports he ran out of supplement several months ago and has not repurchased. He has not restarted supplementation.   Lab Results  Component Value Date   VD25OH 38 06/10/2017      Current Medications:  Current Outpatient Medications on File Prior to Visit  Medication Sig  . aspirin 81 MG tablet Take 81 mg by mouth daily.  Marland Kitchen atenolol (TENORMIN) 25 MG tablet TAKE ONE TABLET BY MOUTH AT BEDTIME  . atorvastatin (LIPITOR) 80 MG tablet TAKE ONE TABLET BY MOUTH DAILY  . dapagliflozin propanediol (FARXIGA) 10 MG TABS tablet Take 10 mg by mouth daily.  . diazepam (VALIUM) 5 MG tablet Take 0.5 tablets (2.5 mg total) by mouth every 8 (eight) hours as needed for muscle spasms.  . Insulin Detemir (LEVEMIR FLEXTOUCH) 100 UNIT/ML Pen INJECT 10 UNITS INTO THE SKIN AT BEDTIME, OR AS DIRECTED  . Insulin Pen Needle (B-D ULTRAFINE III SHORT PEN) 31G X 8 MM MISC CHECK BLOOD SUGAR TWICE DAILY FOR FLUCTUATIONS AND MEDICATION ADJUSTMENT  . loratadine (CLARITIN) 10 MG tablet Take 1 tablet daily for Allergies  . meloxicam (MOBIC) 7.5 MG tablet Take 1 tablet (7.5 mg total) by mouth 2 (two) times daily.  . metFORMIN (GLUCOPHAGE) 1000 MG tablet TAKE ONE TABLET BY MOUTH TWO TIMES DAILY  . oxyCODONE-acetaminophen (PERCOCET/ROXICET) 5-325 MG tablet Take 1-2 tablets by mouth every 4 (four) hours as needed for severe pain.   No current facility-administered medications on file prior to visit.      Allergies:  Allergies  Allergen Reactions  . Amaryl [Glimepiride] Diarrhea  . Codeine Itching  . Pioglitazone Diarrhea, Itching and Anxiety     Medical History:  Past Medical History:  Diagnosis Date  . Cataract   . Colon polyps   . Diabetes ([place]) 1992  . ED (erectile dysfunction)   . Femur  fracture ([facility])    left  . Iron (Fe) deficiency anemia   . Obesity   . Other and unspecified hyperlipidemia   . Premature beats, unspecified   . Unspecified essential hypertension   . Vitamin D deficiency    Family history- Reviewed and unchanged Social history- Reviewed and unchanged   Review of Systems:  Review of Systems  Constitutional: Negative for malaise/fatigue and weight loss.  HENT: Negative for hearing loss and tinnitus.   Eyes: Negative for blurred vision and double vision.  Respiratory: Negative for cough, shortness of breath and wheezing.   Cardiovascular: Negative for chest pain, palpitations, orthopnea, claudication and leg swelling.  Gastrointestinal: Negative for abdominal pain, blood in stool, constipation, diarrhea, heartburn, melena, nausea and vomiting.  Genitourinary: Negative.   Musculoskeletal: Positive for back pain. Negative for falls, joint pain and myalgias.  Skin: Negative for rash.  Neurological: Negative for dizziness, tingling, sensory change, weakness and headaches.  Endo/Heme/Allergies: Negative for polydipsia.  Psychiatric/Behavioral: Negative.   All other systems reviewed and are negative.   Physical Exam: BP  118/72   Pulse 89   Temp 97.9 F (36.6 C)   Ht 5\' 8"  (1.727 m)   Wt 197 lb (89.4 kg)   SpO2 95%   BMI 29.95 kg/m  Wt Readings from Last 3 Encounters:  10/16/17 197 lb (89.4 kg)  10/14/17 195 lb (88.5 kg)  09/18/17 198 lb 3.2 oz (89.9 kg)   General Appearance: Well nourished, in no apparent distress. Eyes: PERRLA, EOMs, conjunctiva no swelling or erythema Sinuses: No Frontal/maxillary tenderness ENT/Mouth: Ext aud canals clear, TMs without erythema, bulging. No erythema, swelling, or exudate on post pharynx.  Tonsils not swollen or erythematous. Hearing normal.  Neck: Supple, thyroid normal.  Respiratory: Respiratory effort normal, BS equal bilaterally without rales, rhonchi, wheezing or stridor.  Cardio: RRR with no MRGs.  Brisk peripheral pulses without edema.  Abdomen: Soft, + BS.  Non tender, no guarding, rebound,  masses. L inguinal hernia present - soft, easily reduced.  Lymphatics: Non tender without lymphadenopathy.  Musculoskeletal: Severely limited by acute back pain, strength symmetrical, walks very slowly with cane, needs assistance from sitting to standing.  Skin: Warm, dry without rashes, lesions, ecchymosis.  Neuro: Cranial nerves intact. No cerebellar symptoms.  Psych: Awake and oriented X 3, normal affect, Insight and Judgment appropriate.    Izora Ribas, NP 11:21 AM [facility]

## 2017-10-16 ENCOUNTER — Encounter: Payer: Self-pay | Admitting: Adult Health

## 2017-10-16 ENCOUNTER — Ambulatory Visit: Payer: Medicare Other | Admitting: Adult Health

## 2017-10-16 VITALS — BP 118/72 | HR 89 | Temp 97.9°F | Ht 68.0 in | Wt 197.0 lb

## 2017-10-16 DIAGNOSIS — E559 Vitamin D deficiency, unspecified: Secondary | ICD-10-CM | POA: Diagnosis not present

## 2017-10-16 DIAGNOSIS — E785 Hyperlipidemia, unspecified: Secondary | ICD-10-CM

## 2017-10-16 DIAGNOSIS — N182 Chronic kidney disease, stage 2 (mild): Secondary | ICD-10-CM | POA: Diagnosis not present

## 2017-10-16 DIAGNOSIS — E1122 Type 2 diabetes mellitus with diabetic chronic kidney disease: Secondary | ICD-10-CM

## 2017-10-16 DIAGNOSIS — E1129 Type 2 diabetes mellitus with other diabetic kidney complication: Secondary | ICD-10-CM | POA: Diagnosis not present

## 2017-10-16 DIAGNOSIS — Z79899 Other long term (current) drug therapy: Secondary | ICD-10-CM

## 2017-10-16 DIAGNOSIS — E1165 Type 2 diabetes mellitus with hyperglycemia: Secondary | ICD-10-CM | POA: Diagnosis not present

## 2017-10-16 DIAGNOSIS — I10 Essential (primary) hypertension: Secondary | ICD-10-CM | POA: Diagnosis not present

## 2017-10-16 DIAGNOSIS — C9190 Lymphoid leukemia, unspecified not having achieved remission: Secondary | ICD-10-CM | POA: Diagnosis not present

## 2017-10-16 DIAGNOSIS — C9110 Chronic lymphocytic leukemia of B-cell type not having achieved remission: Secondary | ICD-10-CM

## 2017-10-16 NOTE — Patient Instructions (Signed)
  Targets for Glucose Readings: Time of Check Usual Target for Most People  Before Meals  70-130  Two hours after meals  Less than 180  Bedtime  90-150   Why Should You Check Your Blood Glucose? -The A1C tells you how your diabetes is doing over a 3 month period.  Home blood glucose monitoring (or checking) gives you information about your diabetes on a daily basis.  You will learn how well your diabetes care plan is working and whether your blood glucose is in your target range throughout the day.   -Reviewing daily blood glucose levels will help you and your healthcare team make any needed changes to you meal plan, physical activity and medications.  Meter Supplies: - A glucose meter was provided at your visit today along with strips and lancets. The blood glucose meter strips and lancets are usually covered by health insurance. Please let us know if they are not and contact your insurance to see which meter they prefer.  How to get a good blood sample: -Wash your hands in warm water.  You do not need to use alcohol wipes. -Massage your hands. -Choose which finger you will use.  It helps to use a different finger each time to avoid soreness. -Keep you hand below your wrist when using you lancet to "prick" your finger. -Apply gentle pressure, but do NOT squeeze your finger.  Checking your blood glucose Important Reminders: -Make sure your strips are not expired.  Check the date on the bottle. -Make sure the code on bottle matches the code on your machine. -Make sure your hands are clean and dry. -Do not use the center of your finger, it is the most sensitive area.  Use a spot to the side of the center of your fingertip. -Completely fill the strip target area with blood (until it beeps) to make sure the results are accurate. -You will need a prescription to have your glucose strips and lancets covered by insurance. -There is usually an 800 number on the back of your meter for help with  meter issues.  How often to check: -If you take diabetes pills or take one injection of insulin each day, you will usually be asked to check twice a day, before breakfast and 2 hours after one meal, or as directed. -If you take several insulin injections each day, you will usually be asked to check four times a day before meals and at bedtime every day.     Bad carbs also include fruit juice, alcohol, and sweet tea. These are empty calories that do not signal to your brain that you are full.   Please remember the good carbs are still carbs which convert into sugar. So please measure them out no more than 1/2-1 cup of rice, oatmeal, pasta, and beans  Veggies are however free foods! Pile them on.   Not all fruit is created equal. Please see the list below, the fruit at the bottom is higher in sugars than the fruit at the top. Please avoid all dried fruits.

## 2017-10-17 LAB — CBC WITH DIFFERENTIAL/PLATELET
Basophils Absolute: 149 cells/uL (ref 0–200)
Basophils Relative: 0.2 %
Eosinophils Absolute: 670 cells/uL — ABNORMAL HIGH (ref 15–500)
Eosinophils Relative: 0.9 %
HCT: 35 % — ABNORMAL LOW (ref 38.5–50.0)
Hemoglobin: 10.7 g/dL — ABNORMAL LOW (ref 13.2–17.1)
Lymphs Abs: 67258 cells/uL — ABNORMAL HIGH (ref 850–3900)
MCH: 22.1 pg — ABNORMAL LOW (ref 27.0–33.0)
MCHC: 30.6 g/dL — ABNORMAL LOW (ref 32.0–36.0)
MCV: 72.3 fL — ABNORMAL LOW (ref 80.0–100.0)
MPV: 10.6 fL (ref 7.5–12.5)
Monocytes Relative: 3.9 %
Neutro Abs: 3422 cells/uL (ref 1500–7800)
Neutrophils Relative %: 4.6 %
Platelets: 229 10*3/uL (ref 140–400)
RBC: 4.84 10*6/uL (ref 4.20–5.80)
RDW: 15.1 % — ABNORMAL HIGH (ref 11.0–15.0)
Total Lymphocyte: 90.4 %
WBC mixed population: 2902 cells/uL — ABNORMAL HIGH (ref 200–950)
WBC: 74.4 10*3/uL — ABNORMAL HIGH (ref 3.8–10.8)

## 2017-10-17 LAB — LIPID PANEL
Cholesterol: 167 mg/dL (ref <200)
HDL: 32 mg/dL — ABNORMAL LOW (ref >40)
LDL Cholesterol (Calc): 112 mg/dL (calc) — ABNORMAL HIGH
Non-HDL Cholesterol (Calc): 135 mg/dL (calc) — ABNORMAL HIGH (ref <130)
Total CHOL/HDL Ratio: 5.2 (calc) — ABNORMAL HIGH (ref <5.0)
Triglycerides: 120 mg/dL (ref <150)

## 2017-10-17 LAB — HEPATIC FUNCTION PANEL
AG Ratio: 1.5 (calc) (ref 1.0–2.5)
ALT: 12 U/L (ref 9–46)
AST: 14 U/L (ref 10–35)
Albumin: 4.3 g/dL (ref 3.6–5.1)
Alkaline phosphatase (APISO): 106 U/L (ref 40–115)
Bilirubin, Direct: 0.2 mg/dL (ref 0.0–0.2)
Globulin: 2.9 g/dL (calc) (ref 1.9–3.7)
Indirect Bilirubin: 0.6 mg/dL (calc) (ref 0.2–1.2)
Total Bilirubin: 0.8 mg/dL (ref 0.2–1.2)
Total Protein: 7.2 g/dL (ref 6.1–8.1)

## 2017-10-17 LAB — BASIC METABOLIC PANEL WITH GFR
BUN: 20 mg/dL (ref 7–25)
CO2: 26 mmol/L (ref 20–32)
Calcium: 10 mg/dL (ref 8.6–10.3)
Chloride: 98 mmol/L (ref 98–110)
Creat: 1.15 mg/dL (ref 0.70–1.18)
GFR, Est African American: 73 mL/min/{1.73_m2} (ref >=60)
GFR, Est Non African American: 63 mL/min/{1.73_m2} (ref >=60)
Glucose, Bld: 167 mg/dL — ABNORMAL HIGH (ref 65–99)
Potassium: 5 mmol/L (ref 3.5–5.3)
Sodium: 135 mmol/L (ref 135–146)

## 2017-10-17 LAB — HEMOGLOBIN A1C
Hgb A1c MFr Bld: 12.2 % of total Hgb — ABNORMAL HIGH (ref <5.7)
Mean Plasma Glucose: 303 (calc)
eAG (mmol/L): 16.8 (calc)

## 2017-10-17 LAB — VITAMIN D 25 HYDROXY (VIT D DEFICIENCY, FRACTURES): Vit D, 25-Hydroxy: 34 ng/mL (ref 30–100)

## 2017-10-17 LAB — TSH: TSH: 1.09 mIU/L (ref 0.40–4.50)

## 2017-11-27 ENCOUNTER — Other Ambulatory Visit: Payer: Self-pay | Admitting: Internal Medicine

## 2017-11-27 ENCOUNTER — Ambulatory Visit: Payer: Self-pay | Admitting: Adult Health

## 2017-11-28 ENCOUNTER — Encounter: Payer: Self-pay | Admitting: Adult Health

## 2017-12-02 ENCOUNTER — Other Ambulatory Visit: Payer: Self-pay | Admitting: Surgery

## 2017-12-02 NOTE — Progress Notes (Signed)
Diabetes Education and Follow-Up Visit  72 y.o.male presents for diabetic education. He has Diabetes Mellitus type 2:  with diabetic chronic kidney disease, he is on bASA, and denies foot ulcerations, hypoglycemia , increased appetite, nausea, paresthesia of the feet, polydipsia, polyuria, visual disturbances and vomiting. He is also discussing elective left inguinal hernia surgery with [facility]; we were unable to provide surgical clearance in light of recent poorly controlled A1Cs.   Last hemoglobin A1c was: Lab Results  Component Value Date   HGBA1C 12.2 (H) 10/16/2017   HGBA1C 8.5 (H) 06/10/2017   HGBA1C 10.1 (H) 01/09/2017   BMI is Body mass index is 27.83 kg/m., he has been working on diet and has lost 14 lb since our last visit.  Wt Readings from Last 3 Encounters:  12/03/17 183 lb (83 kg)  10/16/17 197 lb (89.4 kg)  10/14/17 195 lb (88.5 kg)     Pt is on a regimen of: Metformin ER 500 mg x 4 tabs daily, levemir 25 units - reports was previously on significantly higher doses but this was reduced to current dose several years back  Pt checks his sugars 1 x day - typically checks fasting - averages 165 Lowest sugar was 148.  He has never had hypoglycemic episodes.  Highest sugar was 190.  Glucometer: Freestyle lite  Exercise: limited by active compression fracture  Patient does have CKD He is not on ACE/ARB  Lab Results  Component Value Date   GFRAA 73 10/16/2017   Lab Results  Component Value Date   CREATININE 1.15 10/16/2017   BUN 20 10/16/2017   NA 135 10/16/2017   K 5.0 10/16/2017   CL 98 10/16/2017   CO2 26 10/16/2017   Lab Results  Component Value Date   MICROALBUR 0.7 02/23/2016     He is on a Statin.  He is not at goal of less than 70.  Lab Results  Component Value Date   CHOL 167 10/16/2017   HDL 32 (L) 10/16/2017   LDLCALC 112 (H) 10/16/2017   TRIG 120 10/16/2017   CHOLHDL 5.2 (H) 10/16/2017     Problem List has Poorly  controlled type II diabetes mellitus with renal complication ([place]); Essential hypertension; Vitamin D deficiency; Medication management; PVC (premature ventricular contraction); Encounter for Medicare annual wellness exam; Hyperlipemia; CLL (chronic lymphocytic leukemia) ([place]); [place]; Insulin-requiring or dependent type II diabetes mellitus ([place]); and CKD stage 2 due to type 2 diabetes mellitus ([place]) on their problem list.  Medications Current Outpatient Medications on File Prior to Visit  Medication Sig  . aspirin 81 MG tablet Take 81 mg by mouth daily.  Marland Kitchen atenolol (TENORMIN) 25 MG tablet TAKE ONE TABLET BY MOUTH AT BEDTIME  . atorvastatin (LIPITOR) 80 MG tablet TAKE ONE TABLET BY MOUTH DAILY  . diazepam (VALIUM) 5 MG tablet Take 0.5 tablets (2.5 mg total) by mouth every 8 (eight) hours as needed for muscle spasms.  . Insulin Detemir (LEVEMIR FLEXTOUCH) 100 UNIT/ML Pen INJECT 10 UNITS INTO THE SKIN AT BEDTIME, OR AS DIRECTED  . Insulin Pen Needle (B-D ULTRAFINE III SHORT PEN) 31G X 8 MM MISC CHECK BLOOD SUGAR TWICE DAILY FOR FLUCTUATIONS AND MEDICATION ADJUSTMENT  . loratadine (CLARITIN) 10 MG tablet Take 1 tablet daily for Allergies  . meloxicam (MOBIC) 7.5 MG tablet Take 1 tablet (7.5 mg total) by mouth 2 (two) times daily.  . metFORMIN (GLUCOPHAGE) 1000 MG tablet TAKE ONE TABLET BY MOUTH TWO TIMES DAILY  . oxyCODONE-acetaminophen (PERCOCET/ROXICET) 5-325  MG tablet Take 1-2 tablets by mouth every 4 (four) hours as needed for severe pain.   No current facility-administered medications on file prior to visit.     ROS- see HPI  Physical Exam: Blood pressure (!) 104/58, pulse 70, temperature 97.7 F (36.5 C), height 5\' 8"  (1.727 m), weight 183 lb (83 kg), SpO2 96 %. Body mass index is 27.83 kg/m. General Appearance: Well nourished, in no apparent distress. Eyes: PERRLA, EOMs, conjunctiva no swelling or erythema ENT/Mouth: Ext aud canals clear, TMs without erythema, bulging. No erythema,  swelling, or exudate on post pharynx.  Tonsils not swollen or erythematous. Hearing normal.  Respiratory: Respiratory effort normal, BS equal bilaterally without rales, rhonchi, wheezing or stridor.  Cardio: RRR with no MRGs. Brisk peripheral pulses without edema.  Abdomen: Soft, + BS.  Non tender, no guarding, rebound, hernias, masses. Musculoskeletal: Full ROM, 5/5 strength, normal gait.  Skin: Warm, dry without rashes, lesions, ecchymosis.  Neuro: Cranial nerves intact. Normal muscle tone, no cerebellar symptoms. Sensation intact.    Plan and Assessment: Diabetes Education: Reviewed 'ABCs' of diabetes management (respective goals in parentheses):  A1C (<7), blood pressure (<130/80), and cholesterol (LDL <70) Interim goal of A1C <9 for surgical clearance was set - will re-evaluate on 4/30 Foot exam performed today Reminded eye Exam yearly and Dental Exam every 6 months- has appropriately scheduled Checking fructosamine and microalbumin today Dietary recommendations Physical Activity recommendations - Strongly advised him to start checking sugars at different times of the day - check 2 times a day, rotating checks - given sugar log and advised how to fill it and to bring it at next appt  - Increase levemir to 30 units, then increase by 2 units every 3 days until fasting glucose comes down to 130s. Will switch to novolin 70/30 when he runs out of levemir - technique for new insulin demonstrated in office today - given foot care handout and explained the principles  - given instructions for hypoglycemia management  - He has an upcoming appointment with cardiology - discussed benefits of ACE/ARB and encouraged to discuss whether reducing BB would be appropriate to add one of these agents   Future Appointments  Date Time Provider [place]  12/25/2017  8:30 AM [facility] None  12/25/2017  9:00 AM Wyatt Portela, MD [facility] None  01/14/2018  9:30 AM Liane Comber, NP [facility] None  05/29/2018  9:00 AM [name], MD [facility] None

## 2017-12-03 ENCOUNTER — Encounter: Payer: Self-pay | Admitting: Adult Health

## 2017-12-03 ENCOUNTER — Ambulatory Visit: Payer: Medicare Other | Admitting: Adult Health

## 2017-12-03 VITALS — BP 104/58 | HR 70 | Temp 97.7°F | Ht 68.0 in | Wt 183.0 lb

## 2017-12-03 DIAGNOSIS — S32000A Wedge compression fracture of unspecified lumbar vertebra, initial encounter for closed fracture: Secondary | ICD-10-CM | POA: Insufficient documentation

## 2017-12-03 DIAGNOSIS — I10 Essential (primary) hypertension: Secondary | ICD-10-CM

## 2017-12-03 DIAGNOSIS — E1165 Type 2 diabetes mellitus with hyperglycemia: Secondary | ICD-10-CM | POA: Diagnosis not present

## 2017-12-03 DIAGNOSIS — E1129 Type 2 diabetes mellitus with other diabetic kidney complication: Secondary | ICD-10-CM | POA: Diagnosis not present

## 2017-12-03 DIAGNOSIS — N182 Chronic kidney disease, stage 2 (mild): Secondary | ICD-10-CM | POA: Diagnosis not present

## 2017-12-03 DIAGNOSIS — E1122 Type 2 diabetes mellitus with diabetic chronic kidney disease: Secondary | ICD-10-CM | POA: Diagnosis not present

## 2017-12-03 DIAGNOSIS — E785 Hyperlipidemia, unspecified: Secondary | ICD-10-CM

## 2017-12-03 NOTE — Patient Instructions (Addendum)
Ask heart doctor about reducing atenolol and adding ACE/ARB medication to protect kidneys   I'd like for you to start checking blood sugars TWICE a day  Once fasting -   Once more 2 hours after a meal (rotate through breakfast, lunch, dinner) and write down on a log and bring it to your next visit.   Increase levemir to 30 units daily - continue to adjust insulin up by 2 unit every 3 days until you start having fasting glucose numbers near goal of 130.    Targets for Glucose Readings: Time of Check Usual Target for Most People  Before Meals  70-130  Two hours after meals  Less than 180  Bedtime  90-150   Why Should You Check Your Blood Glucose? -The A1C tells you how your diabetes is doing over a 3 month period.  Home blood glucose monitoring (or checking) gives you information about your diabetes on a daily basis.  You will learn how well your diabetes care plan is working and whether your blood glucose is in your target range throughout the day.   -Reviewing daily blood glucose levels will help you and your healthcare team make any needed changes to you meal plan, physical activity and medications.  Meter Supplies: - A glucose meter was provided at your visit today along with strips and lancets. The blood glucose meter strips and lancets are usually covered by health insurance. Please let us know if they are not and contact your insurance to see which meter they prefer.  How to get a good blood sample: -Wash your hands in warm water.  You do not need to use alcohol wipes. -Massage your hands. -Choose which finger you will use.  It helps to use a different finger each time to avoid soreness. -Keep you hand below your wrist when using you lancet to "prick" your finger. -Apply gentle pressure, but do NOT squeeze your finger.  Checking your blood glucose Important Reminders: -Make sure your strips are not expired.  Check the date on the bottle. -Make sure the code on bottle matches  the code on your machine. -Make sure your hands are clean and dry. -Do not use the center of your finger, it is the most sensitive area.  Use a spot to the side of the center of your fingertip. -Completely fill the strip target area with blood (until it beeps) to make sure the results are accurate. -You will need a prescription to have your glucose strips and lancets covered by insurance. -There is usually an 800 number on the back of your meter for help with meter issues.  How often to check: -If you take diabetes pills or take one injection of insulin each day, you will usually be asked to check twice a day, before breakfast and 2 hours after one meal, or as directed. -If you take several insulin injections each day, you will usually be asked to check four times a day before meals and at bedtime every day.    Insulin Injection Instructions, Single Insulin Dose, Adult A subcutaneous injection is a shot of medicine that is injected into the layer of fat between skin and muscle. People with type 1 diabetes must take insulin because their bodies do not make it. People with type 2 diabetes may need to take insulin. There are many different types of insulin. The type of insulin that you take may determine how many injections you give yourself and when you need to take the injections. Choosing  a site for injection Insulin absorption varies from site to site. It is best to inject insulin within the same body area, using a different spot in that area for each injection. Do not inject the insulin in the same spot for each injection. There are five main areas that can be used for injecting. These areas include:  Abdomen. This is the preferred area.  Front of thigh.  Upper, outer side of thigh.  Back of upper arm.  Buttocks.  Using a syringe and vial: single insulin dose First, follow the steps for Getting Ready, then continue with the steps for Pushing Air Into the [place], then follow the steps for  Filling the Syringe, and finish with the steps for Injecting the Insulin. Getting Ready  1. Wash your hands with soap and water. If soap and water are not available, use hand sanitizer. 2. Check the expiration date and type of insulin. 3. If you are using CLEAR insulin, check to see that it is clear and free of clumps. 4. Gently roll the medicine bottle (vial) between your palms to warm it. Do not shake the vial. 5. Remove the plastic pop-top covering from the vial, if one is present. 6. Use an alcohol wipe to clean the rubber top of the vial. 7. Remove the plastic cover from the needle on the syringe. Do not let the needle touch anything. Pushing Air Into the [place]  1. Pull the plunger back to bring (draw up) air into the syringe. The amount of air should be the same as the number of units in a dose of single insulin. 2. While you keep the vial right-side up, poke the needle through the rubber top of the vial. Do not turn the vial upside down to do this. 3. Push the plunger in all the way. This pushes air into the vial. 4. Do not take the needle out of the vial yet. Filling the Syringe 1. With the needle still inserted in the vial, turn the vial upside down and hold it at eye level. 2. Slowly pull back on the plunger to draw up the desired number of insulin units into the syringe. 3. If you see air bubbles in the syringe, slowly move the plunger up and down 2 or 3 times to get rid of them. 4. Pull back the plunger until the syringe is filled to the correct dose. 5. Remove the needle from the vial. Do not let the needle touch anything. Injecting the Insulin  1. Use an alcohol wipe to clean the site where you will be injecting the needle. Let the site air-dry. 2. Hold the syringe in your writing hand like a pencil. 3. Use your other hand to pinch and hold about an inch of skin. Do not directly touch the cleaned part of the skin. 4. Gently but quickly, put the needle straight into the skin.  The needle should be at a 90-degree angle (perpendicular) to the skin, as if to form the letter "L." ? For example, if you are giving an injection in the abdomen, the abdomen forms one "leg" of the "L" and the needle forms the other "leg" of the "L." 5. For adults who have a small amount of body fat, the needle may need to be injected at a 45-degree angle instead. Your health care provider will tell you if this is necessary. ? A 45-degree angle looks like the letter "V." 6. Push the needle in as far as it will go (to the hub).  7. When the needle is completely inserted into the skin, use the thumb of your writing hand to push the plunger down all the way to inject the insulin. 8. Let go of the skin that you are pinching. Continue to hold the syringe in place with your writing hand. 9. Wait 5 seconds, then pull the needle straight out of the skin. 10. Press and hold the alcohol wipe over the injection site until any bleeding stops. Do not rub the area. 11. Do not put the plastic cover back on the needle. 12. Discard the syringe and needle directly into a sharps container, such as an empty plastic bottle with a cover. Throwing away supplies   Discard all used needles in a puncture-proof sharps disposal container. You can ask your local pharmacy about where you can get this kind of disposal container, or you can use an empty liquid laundry detergent bottle that has a cover.  Follow the disposal regulations for the area where you live. Do not use any syringe or needle more than one time.  Throw away empty vials in the regular trash. What questions should I ask my health care provider?  How often should I be taking insulin?  How often should I check my blood glucose?  What amount of insulin should I be taking at each time?  What are the side effects?  What should I do if my blood glucose is too high?  What should I do if my blood glucose is too low?  What should I do if I forget to take my  insulin?  What number should I call if I have questions? Where can I get more information?  American Diabetes Association (ADA): www.diabetes.org  American Association of Diabetes Educators (AADE) Patient Resources: https://www.diabeteseducator.org/patient-resources This information is not intended to replace advice given to you by your health care provider. Make sure you discuss any questions you have with your health care provider. Document Released: 10/07/2015 Document Revised: 02/09/2016 Document Reviewed: 10/07/2015 Elsevier Interactive Patient Education  2018 [employer].    Hypoglycemia Hypoglycemia occurs when the level of sugar (glucose) in the blood is too low. Glucose is a type of sugar that provides the body's main source of energy. Certain hormones (insulin and glucagon) control the level of glucose in the blood. Insulin lowers blood glucose, and glucagon increases blood glucose. Hypoglycemia can result from having too much insulin in the bloodstream, or from not eating enough food that contains glucose. Hypoglycemia can happen in people who do or do not have diabetes. It can develop quickly, and it can be a medical emergency. What are the causes? Hypoglycemia occurs most often in people who have diabetes. If you have diabetes, hypoglycemia may be caused by:  Diabetes medicine.  Not eating enough, or not eating often enough.  Increased physical activity.  Drinking alcohol, especially when you have not eaten recently.  If you do not have diabetes, hypoglycemia may be caused by:  A tumor in the pancreas. The pancreas is the organ that makes insulin.  Not eating enough, or not eating for long periods at a time (fasting).  Severe infection or illness that affects the liver, heart, or kidneys.  Certain medicines.  You may also have reactive hypoglycemia. This condition causes hypoglycemia within 4 hours of eating a meal. This may occur after having stomach surgery.  Sometimes, the cause of reactive hypoglycemia is not known. What increases the risk? Hypoglycemia is more likely to develop in:  People who have diabetes  and take medicines to lower blood glucose.  People who abuse alcohol.  People who have a severe illness.  What are the signs or symptoms? Hypoglycemia may not cause any symptoms. If you have symptoms, they may include:  Hunger.  Anxiety.  Sweating and feeling clammy.  Confusion.  Dizziness or feeling light-headed.  Sleepiness.  Nausea.  Increased heart rate.  Headache.  Blurry vision.  Seizure.  Nightmares.  Tingling or numbness around the mouth, lips, or tongue.  A change in speech.  Decreased ability to concentrate.  A change in coordination.  Restless sleep.  Tremors or shakes.  Fainting.  Irritability.  How is this diagnosed? Hypoglycemia is diagnosed with a blood test to measure your blood glucose level. This blood test is done while you are having symptoms. Your health care provider may also do a physical exam and review your medical history. If you do not have diabetes, other tests may be done to find the cause of your hypoglycemia. How is this treated? This condition can often be treated by immediately eating or drinking something that contains glucose, such as:  3-4 sugar tablets (glucose pills).  Glucose gel, 15-gram tube.  Fruit juice, 4 oz (120 mL).  Regular soda (not diet soda), 4 oz (120 mL).  Low-fat milk, 4 oz (120 mL).  Several pieces of hard candy.  Sugar or honey, 1 Tbsp.  Treating Hypoglycemia If You Have Diabetes  If you are alert and able to swallow safely, follow the 15:15 rule:  Take 15 grams of a rapid-acting carbohydrate. Rapid-acting options include: ? 1 tube of glucose gel. ? 3 glucose pills. ? 6-8 pieces of hard candy. ? 4 oz (120 mL) of fruit juice. ? 4 oz (120 ml) of regular (not diet) soda.  Check your blood glucose 15 minutes after you take the  carbohydrate.  If the repeat blood glucose level is still at or below 70 mg/dL (3.9 mmol/L), take 15 grams of a carbohydrate again.  If your blood glucose level does not increase above 70 mg/dL (3.9 mmol/L) after 3 tries, seek emergency medical care.  After your blood glucose level returns to normal, eat a meal or a snack within 1 hour.  Treating Severe Hypoglycemia Severe hypoglycemia is when your blood glucose level is at or below 54 mg/dL (3 mmol/L). Severe hypoglycemia is an emergency. Do not wait to see if the symptoms will go away. Get medical help right away. Call your local emergency services (911 in the [place] Do not drive yourself to the hospital. If you have severe hypoglycemia and you cannot eat or drink, you may need an injection of glucagon. A family member or close friend should learn how to check your blood glucose and how to give you a glucagon injection. Ask your health care provider if you need to have an emergency glucagon injection kit available. Severe hypoglycemia may need to be treated in a hospital. The treatment may include getting glucose through an IV tube. You may also need treatment for the cause of your hypoglycemia. Follow these instructions at home: General instructions  Avoid any diets that cause you to not eat enough food. Talk with your health care provider before you start any new diet.  Take over-the-counter and prescription medicines only as told by your health care provider.  Limit alcohol intake to no more than 1 drink per day for nonpregnant women and 2 drinks per day for men. One drink equals 12 oz of beer, 5 oz of wine,  or 1 oz of hard liquor.  Keep all follow-up visits as told by your health care provider. This is important. If You Have Diabetes:   Make sure you know the symptoms of hypoglycemia.  Always have a rapid-acting carbohydrate snack with you to treat low blood sugar.  Follow your diabetes management plan, as told by your health care  provider. Make sure you: ? Take your medicines as directed. ? Follow your exercise plan. ? Follow your meal plan. Eat on time, and do not skip meals. ? Check your blood glucose as often as directed. Make sure to check your blood glucose before and after exercise. If you exercise longer or in a different way than usual, check your blood glucose more often. ? Follow your sick day plan whenever you cannot eat or drink normally. Make this plan in advance with your health care provider.  Share your diabetes management plan with people in your workplace, school, and household.  Check your urine for ketones when you are ill and as told by your health care provider.  Carry a medical alert card or wear medical alert jewelry. If You Have Reactive Hypoglycemia or Low Blood Sugar From Other Causes:  Monitor your blood glucose as told by your health care provider.  Follow instructions from your health care provider about eating or drinking restrictions. Contact a health care provider if:  You have problems keeping your blood glucose in your target range.  You have frequent episodes of hypoglycemia. Get help right away if:  You continue to have hypoglycemia symptoms after eating or drinking something containing glucose.  Your blood glucose is at or below 54 mg/dL (3 mmol/L).  You have a seizure.  You faint. These symptoms may represent a serious problem that is an emergency. Do not wait to see if the symptoms will go away. Get medical help right away. Call your local emergency services (911 in the [place] Do not drive yourself to the hospital. This information is not intended to replace advice given to you by your health care provider. Make sure you discuss any questions you have with your health care provider. Document Released: 09/03/2005 Document Revised: 02/15/2016 Document Reviewed: 10/07/2015 Elsevier Interactive Patient Education  [employer].

## 2017-12-04 ENCOUNTER — Other Ambulatory Visit: Payer: Self-pay | Admitting: Surgery

## 2017-12-04 DIAGNOSIS — K4090 Unilateral inguinal hernia, without obstruction or gangrene, not specified as recurrent: Secondary | ICD-10-CM

## 2017-12-05 LAB — MICROALBUMIN / CREATININE URINE RATIO
Creatinine, Urine: 243 mg/dL (ref 20–320)
Microalb Creat Ratio: 28 mcg/mg creat (ref <30)
Microalb, Ur: 6.9 mg/dL

## 2017-12-05 LAB — FRUCTOSAMINE: Fructosamine: 468 umol/L — ABNORMAL HIGH (ref 190–270)

## 2017-12-11 ENCOUNTER — Ambulatory Visit: Payer: Self-pay | Admitting: Adult Health

## 2017-12-13 ENCOUNTER — Ambulatory Visit
Admission: RE | Admit: 2017-12-13 | Discharge: 2017-12-13 | Disposition: A | Discharge status: Home / Self Care | Payer: Medicare Other | Source: Ambulatory Visit | Attending: Surgery | Admitting: Surgery

## 2017-12-13 DIAGNOSIS — K4090 Unilateral inguinal hernia, without obstruction or gangrene, not specified as recurrent: Secondary | ICD-10-CM

## 2017-12-13 MED ORDER — IOPAMIDOL (ISOVUE-300) INJECTION 61%
100.0000 mL | Freq: Once | INTRAVENOUS | Status: AC | PRN
  Administered 2017-12-13: 100 mL via INTRAVENOUS

## 2017-12-25 ENCOUNTER — Telehealth: Payer: Self-pay | Admitting: Oncology

## 2017-12-25 ENCOUNTER — Inpatient Hospital Stay: Payer: Medicare Other

## 2017-12-25 ENCOUNTER — Inpatient Hospital Stay: Payer: Medicare Other | Attending: Oncology | Admitting: Oncology

## 2017-12-25 VITALS — BP 115/56 | HR 67 | Temp 97.7°F | Resp 14 | Ht 68.0 in | Wt 185.0 lb

## 2017-12-25 DIAGNOSIS — C9110 Chronic lymphocytic leukemia of B-cell type not having achieved remission: Secondary | ICD-10-CM | POA: Diagnosis not present

## 2017-12-25 DIAGNOSIS — D509 Iron deficiency anemia, unspecified: Secondary | ICD-10-CM | POA: Diagnosis not present

## 2017-12-25 DIAGNOSIS — M545 Low back pain: Secondary | ICD-10-CM | POA: Diagnosis not present

## 2017-12-25 LAB — CBC WITH DIFFERENTIAL/PLATELET
Basophils Absolute: 0.2 10*3/uL — ABNORMAL HIGH (ref 0.0–0.1)
Basophils Relative: 0 %
Eosinophils Absolute: 0.8 10*3/uL — ABNORMAL HIGH (ref 0.0–0.5)
Eosinophils Relative: 1 %
HCT: 31.8 % — ABNORMAL LOW (ref 38.4–49.9)
Hemoglobin: 9.7 g/dL — ABNORMAL LOW (ref 13.0–17.1)
Lymphocytes Relative: 93 %
Lymphs Abs: 82.7 10*3/uL — ABNORMAL HIGH (ref 0.9–3.3)
MCH: 22.4 pg — ABNORMAL LOW (ref 27.2–33.4)
MCHC: 30.6 g/dL — ABNORMAL LOW (ref 32.0–36.0)
MCV: 73.2 fL — ABNORMAL LOW (ref 79.3–98.0)
Monocytes Absolute: 2 10*3/uL — ABNORMAL HIGH (ref 0.1–0.9)
Monocytes Relative: 2 %
Neutro Abs: 3.9 10*3/uL (ref 1.5–6.5)
Neutrophils Relative %: 4 %
Platelets: 196 10*3/uL (ref 140–400)
RBC: 4.34 MIL/uL (ref 4.20–5.82)
RDW: 16.2 % — ABNORMAL HIGH (ref 11.0–14.6)
WBC: 89.7 10*3/uL (ref 4.0–10.3)

## 2017-12-25 NOTE — Progress Notes (Signed)
Hematology and Oncology Follow Up Visit  Nathan Davis [id number] 1946-07-14 72 y.o. 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  He presented with lymphocytosis and no lymphadenopathy.  He continues to be without any symptoms or indication for treatment.    Current therapy: Active surveillance.   Interim History:  [name] is here for a follow-up.  Since the last visit, he was seen in the emergency department for a compression fracture for his back did not require any surgical intervention.  He was also diagnosed with inguinal hernia on his CT scan in March 2019.  He denies any major changes in his health otherwise.  His back pain is improving.  His appetite is better and have gained weight back.  He denies any lymphadenopathy or easy bruising.  He denies any fevers or chills.  His performance status is unchanged.  He does not report any headaches, blurry vision, syncope or seizures.he denies any excessive fatigue or tiredness.  Does not report any chest pain, palpitation, orthopnea or leg edema. He does not report any cough, wheezing or hemoptysis. Does not report any nausea, vomiting or abdominal pain. Does not report any constipation, diarrhea, hematochezia or melena. He does not report any frequency urgency or hematuria.  He does not report any anxiety or depression.  Remaining review of system is negative.     Medications: I have reviewed the patient's current medications.  Current Outpatient Medications  Medication Sig Dispense Refill  . aspirin 81 MG tablet Take 81 mg by mouth daily.    Marland Kitchen atenolol (TENORMIN) 25 MG tablet TAKE ONE TABLET BY MOUTH AT BEDTIME 90 tablet 1  . atorvastatin (LIPITOR) 80 MG tablet TAKE ONE TABLET BY MOUTH DAILY 90 tablet 0  . diazepam (VALIUM) 5 MG tablet Take 0.5 tablets (2.5 mg total) by mouth every 8 (eight) hours as needed for muscle spasms. 10 tablet  0  . Insulin Detemir (LEVEMIR FLEXTOUCH) 100 UNIT/ML Pen INJECT 10 UNITS INTO THE SKIN AT BEDTIME, OR AS DIRECTED 15 mL 5  . Insulin Pen Needle (B-D ULTRAFINE III SHORT PEN) 31G X 8 MM MISC CHECK BLOOD SUGAR TWICE DAILY FOR FLUCTUATIONS AND MEDICATION ADJUSTMENT 100 each 2  . loratadine (CLARITIN) 10 MG tablet Take 1 tablet daily for Allergies 360 tablet 1  . meloxicam (MOBIC) 7.5 MG tablet Take 1 tablet (7.5 mg total) by mouth 2 (two) times daily. 14 tablet 0  . metFORMIN (GLUCOPHAGE) 1000 MG tablet TAKE ONE TABLET BY MOUTH TWO TIMES DAILY 180 tablet 1  . oxyCODONE-acetaminophen (PERCOCET/ROXICET) 5-325 MG tablet Take 1-2 tablets by mouth every 4 (four) hours as needed for severe pain. 15 tablet 0   No current facility-administered medications for this visit.      Allergies:  Allergies  Allergen Reactions  . Amaryl [Glimepiride] Diarrhea  . Codeine Itching  . Pioglitazone Diarrhea, Itching and Anxiety    Past Medical History, Surgical history, Social history, and Family History were reviewed and updated.   Physical Exam: Blood pressure (!) 115/56, pulse 67, temperature 97.7 F (36.5 C), temperature source Oral, resp. rate 14, height 5\' 8"  (1.727 m), weight 185 lb (83.9 kg), SpO2 100 %. ECOG: 0 General appearance: Alert, awake gentleman without distress. Head: Atraumatic without abnormalities. Oropharynx: Without any thrush or ulcers. Eyes: No scleral icterus. Lymph nodes: Cervical, supraclavicular, and axillary nodes normal. Heart: Regular rate and rhythm without any murmurs or gallops. Lung:  Clear to auscultation without any rhonchi or wheezes or dullness to percussion..  Abdomin: Soft, nontender without any rebound or guarding. Musculoskeletal: No joint deformity or effusion. Skin: No rashes or lesions.   Lab Results: Lab Results  Component Value Date   WBC 74.4 (H) 10/16/2017   HGB 10.7 (L) 10/16/2017   HCT 35.0 (L) 10/16/2017   MCV 72.3 (L) 10/16/2017   PLT 229  10/16/2017     Chemistry      Component Value Date/Time   NA 135 10/16/2017 1130   NA 139 06/26/2017 0813   K 5.0 10/16/2017 1130   K 4.8 06/26/2017 0813   CL 98 10/16/2017 1130   CO2 26 10/16/2017 1130   CO2 28 06/26/2017 0813   BUN 20 10/16/2017 1130   BUN 15.8 06/26/2017 0813   CREATININE 1.15 10/16/2017 1130   CREATININE 1.1 06/26/2017 0813      Component Value Date/Time   CALCIUM 10.0 10/16/2017 1130   CALCIUM 9.6 06/26/2017 0813   ALKPHOS 66 06/26/2017 0813   AST 14 10/16/2017 1130   AST 20 06/26/2017 0813   ALT 12 10/16/2017 1130   ALT 13 06/26/2017 0813   BILITOT 0.8 10/16/2017 1130   BILITOT 0.65 06/26/2017 0813       Impression and Plan:   72 year old gentleman with the following issues:  1.  Stage 0 CLL diagnosed in April 2017.  He presented with lymphocytosis without lymphadenopathy.  CT scan of the abdomen and pelvis obtained in March 2019 showed no lymphadenopathy.  His white cell count was noted today to be elevated with a lymphocytosis.  The natural course of this disease was reviewed again and indication for treatments were discussed.  Given the fact that he is asymptomatic from his disease I see no indication for treatment at this time.  Strict surveillance would be warranted and developing lymphadenopathy or constitutional symptoms would require treatment at that time.  Rapid doubling time of his lymphocytosis will also be a consideration but at this time there is no indication for treatment.  2. Iron deficiency anemia: Continuing iron supplement is recommended at this time.  His hemoglobin has drifted down with indication of worsening iron deficiency.  We will continue to monitor.  3. Autoimmune considerations: No element of autoimmune thrombocytopenia or anemia noted.  We will continue to monitor.  4. Infectious considerations: No recent infections noted at this time.  5. Follow-up: Will be in 6 months.   15  minutes was spent with the patient  face-to-face today.  More than 50% of time was dedicated to patient counseling, education and coordination of his care.    Zola Button, MD 4/10/20199:14 AM

## 2017-12-25 NOTE — Telephone Encounter (Signed)
Appts scheduled AVS/Calendar printed per 4/10 los °

## 2017-12-26 ENCOUNTER — Other Ambulatory Visit: Payer: Self-pay | Admitting: Surgery

## 2017-12-26 ENCOUNTER — Encounter (HOSPITAL_COMMUNITY): Payer: Self-pay | Admitting: *Deleted

## 2017-12-27 ENCOUNTER — Encounter (HOSPITAL_COMMUNITY): Payer: Self-pay

## 2017-12-27 NOTE — Patient Instructions (Addendum)
Nathan Davis  12/27/2017   Your procedure is scheduled on: 01-02-18  Report to [facility] Main  Entrance              Report to admitting at       1130 AM    Call this number if you have problems the morning of surgery [phone]    Remember: Do not eat food  :After Midnight.  You may have clear liquids until 0730 am then nothing by mouth     CLEAR LIQUID DIET   Foods Allowed                                                                     Foods Excluded  Coffee and tea, regular and decaf                             liquids that you cannot  Plain Jell-O in any flavor                                             see through such as: Fruit ices (not with fruit pulp)                                     milk, soups, orange juice  Iced Popsicles                                    All solid food Carbonated beverages, regular and diet                                    Cranberry, grape and apple juices Sports drinks like Gatorade Lightly seasoned clear broth or consume(fat free) Sugar, honey syrup  Sample Menu Breakfast                                Lunch                                     Supper Cranberry juice                    Beef broth                            Chicken broth Jell-O                                     Grape juice  Apple juice Coffee or tea                        Jell-O                                      Popsicle                                                Coffee or tea                        Coffee or tea  _____________________________________________________________________     Take these medicines the morning of surgery with A SIP OF WATER: none DO NOT TAKE ANY DIABETIC MEDICATIONS DAY OF YOUR SURGERY                               You may not have any metal on your body including hair pins and              piercings  Do not wear jewelry,  lotions, powders or perfumes, deodorant      .             Men may shave face and neck.   Do not bring valuables to the hospital. [place].  Contacts, dentures or bridgework may not be worn into surgery.  Leave suitcase in the car. After surgery it may be brought to your room.     Patients discharged the day of surgery will not be allowed to drive home.  Name and phone number of your driver:  Special Instructions: N/A              Please read over the following fact sheets you were given: _____________________________________________________________________             [facility] - Preparing for Surgery Before surgery, you can play an important role.  Because skin is not sterile, your skin needs to be as free of germs as possible.  You can reduce the number of germs on your skin by washing with CHG (chlorahexidine gluconate) soap before surgery.  CHG is an antiseptic cleaner which kills germs and bonds with the skin to continue killing germs even after washing. Please DO NOT use if you have an allergy to CHG or antibacterial soaps.  If your skin becomes reddened/irritated stop using the CHG and inform your nurse when you arrive at Short Stay. Do not shave (including legs and underarms) for at least 48 hours prior to the first CHG shower.  You may shave your face/neck. Please follow these instructions carefully:  1.  Shower with CHG Soap the night before surgery and the  morning of Surgery.  2.  If you choose to wash your hair, wash your hair first as usual with your  normal  shampoo.  3.  After you shampoo, rinse your hair and body thoroughly to remove the  shampoo.  4.  Use CHG as you would any other liquid soap.  You can apply chg directly  to the skin and wash                       Gently with a scrungie or clean washcloth.  5.  Apply the CHG Soap to your body ONLY FROM THE NECK DOWN.   Do not use on face/ open                           Wound or open sores.  Avoid contact with eyes, ears mouth and genitals (private parts).                       Wash face,  Genitals (private parts) with your normal soap.             6.  Wash thoroughly, paying special attention to the area where your surgery  will be performed.  7.  Thoroughly rinse your body with warm water from the neck down.  8.  DO NOT shower/wash with your normal soap after using and rinsing off  the CHG Soap.                9.  Pat yourself dry with a clean towel.            10.  Wear clean pajamas.            11.  Place clean sheets on your bed the night of your first shower and do not  sleep with pets. Day of Surgery : Do not apply any lotions/deodorants the morning of surgery.  Please wear clean clothes to the hospital/surgery center.  FAILURE TO FOLLOW THESE INSTRUCTIONS MAY RESULT IN THE CANCELLATION OF YOUR SURGERY PATIENT SIGNATURE_________________________________  NURSE SIGNATURE__________________________________  ________________________________________________________________________ How to Manage Your Diabetes Before and After Surgery  Why is it important to control my blood sugar before and after surgery? . Improving blood sugar levels before and after surgery helps healing and can limit problems. . A way of improving blood sugar control is eating a healthy diet by: o  Eating less sugar and carbohydrates o  Increasing activity/exercise o  Talking with your doctor about reaching your blood sugar goals . High blood sugars (greater than 180 mg/dL) can raise your risk of infections and slow your recovery, so you will need to focus on controlling your diabetes during the weeks before surgery. . Make sure that the doctor who takes care of your diabetes knows about your planned surgery including the date and location.  How do I manage my blood sugar before surgery? . Check your blood sugar at least 4 times a day, starting 2 days before surgery, to make sure that the level is not  too high or low. o Check your blood sugar the morning of your surgery when you wake up and every 2 hours until you get to the Short Stay unit. . If your blood sugar is less than 70 mg/dL, you will need to treat for low blood sugar: o Do not take insulin. o Treat a low blood sugar (less than 70 mg/dL) with  cup of clear juice (cranberry or apple), 4 glucose tablets, OR glucose gel. o Recheck blood sugar in 15 minutes after treatment (to make sure it is greater than 70 mg/dL). If your blood sugar is not greater than 70 mg/dL on recheck, call  [phone] for further instructions. . Report your blood sugar to the short stay nurse when you get to Short Stay.  . If you are admitted to the hospital after surgery: o Your blood sugar will be checked by the staff and you will probably be given insulin after surgery (instead of oral diabetes medicines) to make sure you have good blood sugar levels. o The goal for blood sugar control after surgery is 80-180 mg/dL.   WHAT DO I DO ABOUT MY DIABETES MEDICATION?  Marland Kitchen Do not take oral diabetes medicines (pills) the morning of surgery.  . THE NIGHT BEFORE SURGERY, take  50%     of  Lantus      Insulin dose      . THE MORNING OF SURGERY, take 0  units of   Lantus      insulin.  . The day of surgery, do not take other diabetes injectables, including Byetta (exenatide), Bydureon (exenatide ER), Victoza (liraglutide), or Trulicity (dulaglutide).  . If your CBG is greater than 220 mg/dL, you may take  of your sliding scale  . (correction) dose of insulin.     Patient Signature:  Date:   Nurse Signature:  Date:   Reviewed and Endorsed by [facility] Patient Education Committee, August 2015

## 2017-12-28 ENCOUNTER — Other Ambulatory Visit: Payer: Self-pay | Admitting: Physician Assistant

## 2017-12-31 ENCOUNTER — Other Ambulatory Visit: Payer: Self-pay

## 2017-12-31 ENCOUNTER — Encounter (HOSPITAL_COMMUNITY): Payer: Self-pay

## 2017-12-31 ENCOUNTER — Encounter (HOSPITAL_COMMUNITY)
Admission: RE | Admit: 2017-12-31 | Discharge: 2017-12-31 | Disposition: A | Discharge status: Home / Self Care | Payer: Medicare Other | Source: Ambulatory Visit | Attending: Surgery | Admitting: Surgery

## 2017-12-31 DIAGNOSIS — Z01812 Encounter for preprocedural laboratory examination: Secondary | ICD-10-CM | POA: Diagnosis not present

## 2017-12-31 DIAGNOSIS — I440 Atrioventricular block, first degree: Secondary | ICD-10-CM | POA: Diagnosis not present

## 2017-12-31 DIAGNOSIS — Z01818 Encounter for other preprocedural examination: Secondary | ICD-10-CM | POA: Diagnosis present

## 2017-12-31 DIAGNOSIS — K4020 Bilateral inguinal hernia, without obstruction or gangrene, not specified as recurrent: Secondary | ICD-10-CM | POA: Diagnosis not present

## 2017-12-31 HISTORY — DX: Cardiac murmur, unspecified: R01.1

## 2017-12-31 HISTORY — DX: Personal history of other diseases of the digestive system: Z87.19

## 2017-12-31 HISTORY — DX: Chronic lymphocytic leukemia of B-cell type not having achieved remission: C91.10

## 2017-12-31 LAB — BASIC METABOLIC PANEL
Anion gap: 9 (ref 5–15)
BUN: 22 mg/dL — ABNORMAL HIGH (ref 6–20)
CO2: 24 mmol/L (ref 22–32)
Calcium: 9.5 mg/dL (ref 8.9–10.3)
Chloride: 105 mmol/L (ref 101–111)
Creatinine, Ser: 1.17 mg/dL (ref 0.61–1.24)
GFR calc Af Amer: >60 mL/min (ref >60)
GFR calc non Af Amer: >60 mL/min (ref >60)
Glucose, Bld: 207 mg/dL — ABNORMAL HIGH (ref 65–99)
Potassium: 4.6 mmol/L (ref 3.5–5.1)
Sodium: 138 mmol/L (ref 135–145)

## 2017-12-31 LAB — CBC
HCT: 31.6 % — ABNORMAL LOW (ref 39.0–52.0)
Hemoglobin: 9.6 g/dL — ABNORMAL LOW (ref 13.0–17.0)
MCH: 22.7 pg — ABNORMAL LOW (ref 26.0–34.0)
MCHC: 30.4 g/dL (ref 30.0–36.0)
MCV: 74.7 fL — ABNORMAL LOW (ref 78.0–100.0)
Platelets: 210 10*3/uL (ref 150–400)
RBC: 4.23 MIL/uL (ref 4.22–5.81)
RDW: 17 % — ABNORMAL HIGH (ref 11.5–15.5)
WBC: 79.2 10*3/uL (ref 4.0–10.5)

## 2017-12-31 LAB — HEMOGLOBIN A1C
Hgb A1c MFr Bld: 9.6 % — ABNORMAL HIGH (ref 4.8–5.6)
Mean Plasma Glucose: 228.82 mg/dL

## 2017-12-31 LAB — GLUCOSE, CAPILLARY: Glucose-Capillary: 192 mg/dL — ABNORMAL HIGH (ref 65–99)

## 2017-12-31 NOTE — Progress Notes (Signed)
CRITICAL VALUE ALERT  Critical Value:  Wbc 79.2   Date & Time Notied:  12-31-17   First call into Abigail Butts, RN at [place] at 11:00 am, Second call  To [facility] at 11:50 , Puja, PA in with a patient will return my call.  Puja returned cal l4-16-19  Provider Notified: Carlena Hurl, PA  Orders Received/Actions taken: no new orders

## 2017-12-31 NOTE — Progress Notes (Addendum)
Progress note in epic 12-03-17 pt. PCP Liane Comber, NP  Pt. Goal hgb for surgery is less than 9. Hgb a1c is 9.6 at preop and per anesthesia  Dr. Marcie Bal  pt. Is not cleared for surgery per PCP note. Abigail Butts at [place] aware pt. Not cleared for surgery.

## 2017-12-31 NOTE — Progress Notes (Signed)
Cbc diff 12-25-17 epic,  hgb a1c 10-16-17 epic

## 2017-12-31 NOTE — Progress Notes (Signed)
Final ekg 12-31-17 epic

## 2017-12-31 NOTE — Progress Notes (Signed)
Cbc,bmp,hgba1c routed to Dr. Ninfa Linden via epic.

## 2018-01-01 ENCOUNTER — Encounter (HOSPITAL_COMMUNITY): Payer: Self-pay | Admitting: *Deleted

## 2018-01-02 ENCOUNTER — Ambulatory Visit (HOSPITAL_COMMUNITY): Admission: RE | Admit: 2018-01-02 | Payer: Medicare Other | Source: Ambulatory Visit | Admitting: Surgery

## 2018-01-02 ENCOUNTER — Encounter (HOSPITAL_COMMUNITY): Admission: RE | Payer: Self-pay | Source: Ambulatory Visit

## 2018-01-02 SURGERY — REPAIR, HERNIA, INGUINAL, BILATERAL, LAPAROSCOPIC
Anesthesia: General | Laterality: Bilateral

## 2018-01-14 ENCOUNTER — Ambulatory Visit: Payer: Self-pay | Admitting: Adult Health

## 2018-01-14 ENCOUNTER — Other Ambulatory Visit: Payer: Self-pay | Admitting: *Deleted

## 2018-01-14 DIAGNOSIS — E109 Type 1 diabetes mellitus without complications: Secondary | ICD-10-CM

## 2018-01-14 MED ORDER — INSULIN DETEMIR 100 UNIT/ML FLEXPEN: 30.0000 [IU] | PEN_INJECTOR | Freq: Every day | SUBCUTANEOUS | 0 refills | Status: DC

## 2018-01-14 MED ORDER — INSULIN PEN NEEDLE 31G X 8 MM MISC
2 refills | Status: DC
Start: 2018-01-14 — End: 2018-05-28

## 2018-01-15 ENCOUNTER — Ambulatory Visit: Payer: Self-pay | Admitting: Adult Health

## 2018-01-16 ENCOUNTER — Ambulatory Visit: Payer: Self-pay | Admitting: Adult Health

## 2018-01-20 NOTE — Progress Notes (Signed)
MEDICARE ANNUAL WELLNESS VISIT AND FOLLOW UP Assessment:   Diagnoses and all orders for this visit:  Encounter for Medicare annual wellness exam  PVC (premature ventricular contraction) Control BP, followed by cardiology  Essential hypertension Continue medications Monitor blood pressure at home; call if consistently over 130/80 Continue DASH diet.   Reminder to go to the ER if any CP, SOB, nausea, dizziness, severe HA, changes vision/speech, left arm numbness and tingling and jaw pain.  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 [facility]) Education: Reviewed 'ABCs' of diabetes management (respective goals in parentheses):  A1C (initial goal <9), blood pressure (<130/80), and cholesterol (LDL <70) Eye Exam yearly and Dental Exam every 6 months- will call to schedule eye exam - due for this year Dietary recommendations Physical Activity recommendations  CKD stage 2 due to type 2 diabetes mellitus (HCC) Increase fluids, avoid NSAIDS, monitor sugars, will monitor CMP/GFR  Compression fracture of C-spine, sequela Followed by ortho  Vitamin D deficiency Continue supplementation Check vitamin D level  Medication management CBC, CMP/GFR  Hyperlipidemia, unspecified hyperlipidemia type Discussed goal LDL <70 Continue medications Continue low cholesterol diet and exercise.  Check lipid panel.   CLL (chronic lymphocytic leukemia) (HCC) CBC, followed by Dr. Alen Blew  Left inguinal hernia Pending surgical clearance for A1C goal <9 Will send clearance to [facility]   Over 30 minutes of exam, counseling, chart review, and critical decision making was performed  Future Appointments  Date Time Provider [place]  05/29/2018  9:00 AM [name], MD [facility] None  06/25/2018  8:30 AM [facility] 5 [facility] None  06/25/2018  9:00 AM Shadad, Mathis Dad, MD [facility] None     Plan:   During the course of the visit the patient  was educated and counseled about appropriate screening and preventive services including:    Pneumococcal vaccine   Influenza vaccine  Prevnar 13  Td vaccine  Screening electrocardiogram  Colorectal cancer screening  Diabetes screening  Glaucoma screening  Nutrition counseling    Subjective:  Nathan Davis is a 72 y.o. male who presents for Medicare Annual Wellness Visit and 3 month follow up for HTN, hyperlipidemia, T2DM, and vitamin D Def. Also patient is dx'd with CLL followed by Active Surveillance by Dr Alen Blew. Patient had a negative Stress Test in 2011 and has been followed by Dr Aundra Dubin. He is pending surgical clearance for L inguinal hernia due to poorly controlled A1C.   BMI is Body mass index is 27.52 kg/m., he has been working on diet. Wt Readings from Last 3 Encounters:  01/22/18 181 lb (82.1 kg)  12/31/17 185 lb (83.9 kg)  12/25/17 185 lb (83.9 kg)   His blood pressure has been controlled at home, today their BP is BP: 104/62 He does not workout (r/t back pain). He denies chest pain, shortness of breath, dizziness.   He is on cholesterol medication (atorvastatin 40 mg daily) and denies myalgias. His cholesterol is not at goal. The cholesterol last visit was:   Lab Results  Component Value Date   CHOL 167 10/16/2017   HDL 32 (L) 10/16/2017   LDLCALC 112 (H) 10/16/2017   TRIG 120 10/16/2017   CHOLHDL 5.2 (H) 10/16/2017   He has been working on diet for T2 diabetes, and denies foot ulcerations, increased appetite, nausea, paresthesia of the feet, polydipsia, polyuria, visual disturbances, vomiting and weight loss. He does check a fasting glucose, reports ranging from 120-165 with rare outlier. Last A1C in the office  was:  Lab Results  Component Value Date   HGBA1C 9.6 (H) 12/31/2017   Last GFR Lab Results  Component Value Date   GFRAA >60 12/31/2017   Patient is on Vitamin D supplement but remains well below goal of 70 at most recent check:  Lab  Results  Component Value Date   VD25OH 34 10/16/2017      Medication Review:  Current Outpatient Medications (Endocrine & Metabolic):  Marland Kitchen  Insulin Detemir (LEVEMIR FLEXTOUCH) 100 UNIT/ML Pen, Inject 30 Units into the skin daily. .  metFORMIN (GLUCOPHAGE) 1000 MG tablet, TAKE ONE TABLET BY MOUTH TWO TIMES DAILY (Patient taking differently: TAKE 1000 MG BY MOUTH TWO TIMES DAILY)  Current Outpatient Medications (Cardiovascular):  .  atenolol (TENORMIN) 25 MG tablet, TAKE ONE TABLET BY MOUTH AT BEDTIME (Patient taking differently: TAKE 25 MG BY MOUTH AT BEDTIME) .  atorvastatin (LIPITOR) 80 MG tablet, TAKE 40 MG BY MOUTH DAILY  Current Outpatient Medications (Respiratory):  .  loratadine (CLARITIN) 10 MG tablet, Take 1 tablet daily for Allergies (Patient taking differently: Take 10 mg by mouth daily as needed for allergies. )  Current Outpatient Medications (Analgesics):  .  acetaminophen (TYLENOL) 500 MG tablet, Take 1,000 mg by mouth every 6 (six) hours as needed for moderate pain or headache. Marland Kitchen  aspirin 81 MG tablet, Take 81 mg by mouth daily. .  meloxicam (MOBIC) 7.5 MG tablet, Take 1 tablet (7.5 mg total) by mouth 2 (two) times daily. Marland Kitchen  oxyCODONE-acetaminophen (PERCOCET/ROXICET) 5-325 MG tablet, Take 1-2 tablets by mouth every 4 (four) hours as needed for severe pain.   Current Outpatient Medications (Other):  Marland Kitchen  Cholecalciferol (VITAMIN D3) 1000 units CAPS, Take 1,000 Units by mouth daily. .  diazepam (VALIUM) 5 MG tablet, Take 0.5 tablets (2.5 mg total) by mouth every 8 (eight) hours as needed for muscle spasms. .  Insulin Pen Needle (B-D ULTRAFINE III SHORT PEN) 31G X 8 MM MISC, CHECK BLOOD SUGAR TWICE DAILY FOR FLUCTUATIONS AND MEDICATION ADJUSTMENT .  Tetrahydrozoline HCl (VISINE OP), Place 2 drops into both eyes daily as needed (for dry eyes).  Allergies: Allergies  Allergen Reactions  . Amaryl [Glimepiride] Diarrhea  . Codeine Itching  . 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  Screening Tests Immunization History  Administered Date(s) Administered  . Influenza, High Dose Seasonal PF 07/22/2013  . Influenza-Unspecified 06/25/2012, 06/10/2017  . PPD Test 08/27/2013  . Pneumococcal Conjugate-13 08/22/2011  . Pneumococcal Polysaccharide-23 05/30/2016  . Td 09/17/2002  . Tdap 08/27/2013  . Zoster 09/17/2005   Preventative care: Last colonoscopy: 02/2010   Influenza 2018 Prevnar 13: 2012 Pneumo 2017 TDAP 2014  Zoster 2007  Names of Other Physician/Practitioners you currently use: 1. [facility] here for primary care 2. Dr Gershon Crane, eye doctor, Diabetic Eye Exam  May 2018 - report and abstract confirmed 3. No dentist, has dentures, over 3 years  Patient Care Team: [name], MD as PCP - General (Internal Medicine) [name], MD as Attending Physician (Internal Medicine) Rutherford Guys, MD as Consulting Physician (Ophthalmology) Larey Dresser, MD  as Consulting Physician (Cardiology) Inda Castle, MD (Inactive) as Consulting Physician (Gastroenterology) Wyatt Portela, MD as Consulting Physician (Oncology) Frederik Pear, MD as Consulting Physician (Orthopedic Surgery)  Surgical: He  has a past surgical history that includes ETT - MYOVIEW; nissan fundoplication (N/A, 41/32/4401); and Hernia repair. Family His family history includes Alzheimer's disease in his father; Heart attack (age of  onset: 68) in his unknown relative; Hypertension in his unknown relative; Throat cancer in his mother. Social history  He reports that he has never smoked. He has never used smokeless tobacco. He reports that he does not drink alcohol or use drugs.  MEDICARE WELLNESS OBJECTIVES: Physical activity: Current Exercise Habits: The patient does not participate in regular exercise at present, Exercise limited by: orthopedic condition(s) Cardiac risk factors: Cardiac Risk Factors include: advanced age (>54men, >14 women);hypertension;male gender;dyslipidemia;diabetes mellitus;sedentary lifestyle Depression/mood screen:   Depression screen [facility] 2/9 01/22/2018  Decreased Interest 0  Down, Depressed, Hopeless 0  PHQ - 2 Score 0    ADLs:  In your present state of health, do you have any difficulty performing the following activities: 01/22/2018 12/31/2017  Hearing? N N  Vision? N N  Comment - reading  Difficulty concentrating or making decisions? N N  Walking or climbing stairs? Y Y  Comment Back pain (compression fracture) - followed by ortho -  Dressing or bathing? N N  Doing errands, shopping? N N  Some recent data might be hidden     Cognitive Testing  Alert? Yes  Normal Appearance?Yes  Oriented to person? Yes  Place? Yes   Time? Yes  Recall of three objects?  Yes  Can perform simple calculations? Yes  Displays appropriate judgment?Yes  Can read the correct time from a watch face?Yes  EOL planning: Does Patient Have a Medical Advance Directive?: Yes Type of Advance Directive: Healthcare Power of Attorney, Living will Does patient want to make changes to medical advance directive?: No - Patient declined Copy of [place] in Chart?: No - copy requested   Objective:   Today's Vitals   01/22/18 0937  BP: 104/62  Pulse: 62  Temp: (!) 97.3 F (36.3 C)  SpO2: 94%  Weight: 181 lb (82.1 kg)  Height: 5\' 8"  (1.727 m)  PainSc: 6   PainLoc: Back   Body mass index is  27.52 kg/m.  General appearance: alert, no distress, WD/WN, male HEENT: normocephalic, sclerae anicteric, TMs pearly, nares patent, no discharge or erythema, pharynx normal Oral cavity: MMM, no lesions Neck: supple, no lymphadenopathy, no thyromegaly, no masses Heart: RRR, normal S1, S2, no murmurs Lungs: CTA bilaterally, no wheezes, rhonchi, or rales Abdomen: +bs, soft, non tender, non distended, no masses, no hepatomegaly, no splenomegaly Musculoskeletal: nontender, no swelling, no obvious deformity Extremities: scant bilateral ankle edema, no cyanosis, no clubbing Pulses: 2+ symmetric, upper and lower extremities, normal cap refill Neurological: alert, oriented x 3, CN2-12 intact, strength normal upper extremities and lower extremities, sensation normal throughout, slow gait with cane Psychiatric: normal affect, behavior normal, pleasant   Medicare Attestation I have personally reviewed: The patient's medical and social history Their use of alcohol, tobacco or illicit drugs Their current medications and supplements The patient's functional ability including ADLs,fall risks, home safety risks, cognitive, and hearing and visual impairment Diet and physical activities Evidence for depression or mood disorders  The patient's weight, height, BMI, and visual acuity have been recorded in the chart.  I have made referrals, counseling, and provided education to the patient based  on review of the above and I have provided the patient with a written personalized care plan for preventive services.     Izora Ribas, NP   01/22/2018

## 2018-01-22 ENCOUNTER — Encounter: Payer: Self-pay | Admitting: Adult Health

## 2018-01-22 ENCOUNTER — Ambulatory Visit: Payer: Medicare Other | Admitting: Adult Health

## 2018-01-22 VITALS — BP 104/62 | HR 62 | Temp 97.3°F | Ht 68.0 in | Wt 181.0 lb

## 2018-01-22 DIAGNOSIS — S129XXS Fracture of neck, unspecified, sequela: Secondary | ICD-10-CM

## 2018-01-22 DIAGNOSIS — N182 Chronic kidney disease, stage 2 (mild): Secondary | ICD-10-CM

## 2018-01-22 DIAGNOSIS — Z0001 Encounter for general adult medical examination with abnormal findings: Secondary | ICD-10-CM | POA: Diagnosis not present

## 2018-01-22 DIAGNOSIS — Z0000 Encounter for general adult medical examination without abnormal findings: Secondary | ICD-10-CM

## 2018-01-22 DIAGNOSIS — K4090 Unilateral inguinal hernia, without obstruction or gangrene, not specified as recurrent: Secondary | ICD-10-CM

## 2018-01-22 DIAGNOSIS — Z79899 Other long term (current) drug therapy: Secondary | ICD-10-CM | POA: Diagnosis not present

## 2018-01-22 DIAGNOSIS — R6889 Other general symptoms and signs: Secondary | ICD-10-CM

## 2018-01-22 DIAGNOSIS — C9110 Chronic lymphocytic leukemia of B-cell type not having achieved remission: Secondary | ICD-10-CM

## 2018-01-22 DIAGNOSIS — C9190 Lymphoid leukemia, unspecified not having achieved remission: Secondary | ICD-10-CM | POA: Diagnosis not present

## 2018-01-22 DIAGNOSIS — I493 Ventricular premature depolarization: Secondary | ICD-10-CM

## 2018-01-22 DIAGNOSIS — E1122 Type 2 diabetes mellitus with diabetic chronic kidney disease: Secondary | ICD-10-CM

## 2018-01-22 DIAGNOSIS — E559 Vitamin D deficiency, unspecified: Secondary | ICD-10-CM

## 2018-01-22 DIAGNOSIS — E1165 Type 2 diabetes mellitus with hyperglycemia: Secondary | ICD-10-CM

## 2018-01-22 DIAGNOSIS — E1129 Type 2 diabetes mellitus with other diabetic kidney complication: Secondary | ICD-10-CM

## 2018-01-22 DIAGNOSIS — I10 Essential (primary) hypertension: Secondary | ICD-10-CM

## 2018-01-22 DIAGNOSIS — E785 Hyperlipidemia, unspecified: Secondary | ICD-10-CM

## 2018-01-22 NOTE — Patient Instructions (Addendum)
Please increase vitamin D (cholecalciferol) to 4000-5000 IU daily    Edema Edema is an abnormal buildup of fluids in your bodytissues. Edema is somewhatdependent on gravity to pull the fluid to the lowest place in your body. That makes the condition more common in the legs and thighs (lower extremities). Painless swelling of the feet and ankles is common and becomes more likely as you get older. It is also common in looser tissues, like around your eyes. When the affected area is squeezed, the fluid may move out of that spot and leave a dent for a few moments. This dent is called pitting. What are the causes? There are many possible causes of edema. Eating too much salt and being on your feet or sitting for a long time can cause edema in your legs and ankles. Hot weather may make edema worse. Common medical causes of edema include:  Heart failure.  Liver disease.  Kidney disease.  Weak blood vessels in your legs.  Cancer.  An injury.  Pregnancy.  Some medications.  Obesity.  What are the signs or symptoms? Edema is usually painless.Your skin may look swollen or shiny. How is this diagnosed? Your health care provider may be able to diagnose edema by asking about your medical history and doing a physical exam. You may need to have tests such as X-rays, an electrocardiogram, or blood tests to check for medical conditions that may cause edema. How is this treated? Edema treatment depends on the cause. If you have heart, liver, or kidney disease, you need the treatment appropriate for these conditions. General treatment may include:  Elevation of the affected body part above the level of your heart.  Compression of the affected body part. Pressure from elastic bandages or support stockings squeezes the tissues and forces fluid back into the blood vessels. This keeps fluid from entering the tissues.  Restriction of fluid and salt intake.  Use of a water pill (diuretic). These  medications are appropriate only for some types of edema. They pull fluid out of your body and make you urinate more often. This gets rid of fluid and reduces swelling, but diuretics can have side effects. Only use diuretics as directed by your health care provider.  Follow these instructions at home:  Keep the affected body part above the level of your heart when you are lying down.  Do not sit still or stand for prolonged periods.  Do not put anything directly under your knees when lying down.  Do not wear constricting clothing or garters on your upper legs.  Exercise your legs to work the fluid back into your blood vessels. This may help the swelling go down.  Wear elastic bandages or support stockings to reduce ankle swelling as directed by your health care provider.  Eat a low-salt diet to reduce fluid if your health care provider recommends it.  Only take medicines as directed by your health care provider. Contact a health care provider if:  Your edema is not responding to treatment.  You have heart, liver, or kidney disease and notice symptoms of edema.  You have edema in your legs that does not improve after elevating them.  You have sudden and unexplained weight gain. Get help right away if:  You develop shortness of breath or chest pain.  You cannot breathe when you lie down.  You develop pain, redness, or warmth in the swollen areas.  You have heart, liver, or kidney disease and suddenly get edema.  You  have a fever and your symptoms suddenly get worse. This information is not intended to replace advice given to you by your health care provider. Make sure you discuss any questions you have with your health care provider. Document Released: 09/03/2005 Document Revised: 02/09/2016 Document Reviewed: 06/26/2013 Elsevier Interactive Patient Education  2017 [employer].

## 2018-01-23 ENCOUNTER — Other Ambulatory Visit: Payer: Self-pay | Admitting: Adult Health

## 2018-01-23 ENCOUNTER — Encounter: Payer: Self-pay | Admitting: Adult Health

## 2018-01-23 DIAGNOSIS — R7989 Other specified abnormal findings of blood chemistry: Secondary | ICD-10-CM

## 2018-01-23 LAB — CBC WITH DIFFERENTIAL/PLATELET
Basophils Absolute: 97 cells/uL (ref 0–200)
Basophils Relative: 0.1 %
Eosinophils Absolute: 195 cells/uL (ref 15–500)
Eosinophils Relative: 0.2 %
HCT: 31 % — ABNORMAL LOW (ref 38.5–50.0)
Hemoglobin: 9.1 g/dL — ABNORMAL LOW (ref 13.2–17.1)
Lymphs Abs: 87757 cells/uL — ABNORMAL HIGH (ref 850–3900)
MCH: 22.2 pg — ABNORMAL LOW (ref 27.0–33.0)
MCHC: 29.4 g/dL — ABNORMAL LOW (ref 32.0–36.0)
MCV: 75.8 fL — ABNORMAL LOW (ref 80.0–100.0)
MPV: 10.7 fL (ref 7.5–12.5)
Monocytes Relative: 4 %
Neutro Abs: 5454 cells/uL (ref 1500–7800)
Neutrophils Relative %: 5.6 %
Platelets: 232 10*3/uL (ref 140–400)
RBC: 4.09 10*6/uL — ABNORMAL LOW (ref 4.20–5.80)
RDW: 15.9 % — ABNORMAL HIGH (ref 11.0–15.0)
Total Lymphocyte: 90.1 %
WBC mixed population: 3896 cells/uL — ABNORMAL HIGH (ref 200–950)
WBC: 97.4 10*3/uL — ABNORMAL HIGH (ref 3.8–10.8)

## 2018-01-23 LAB — COMPLETE METABOLIC PANEL WITH GFR
AG Ratio: 1.5 (calc) (ref 1.0–2.5)
ALT: 14 U/L (ref 9–46)
AST: 21 U/L (ref 10–35)
Albumin: 4.3 g/dL (ref 3.6–5.1)
Alkaline phosphatase (APISO): 76 U/L (ref 40–115)
BUN/Creatinine Ratio: 24 (calc) — ABNORMAL HIGH (ref 6–22)
BUN: 28 mg/dL — ABNORMAL HIGH (ref 7–25)
CO2: 26 mmol/L (ref 20–32)
Calcium: 10 mg/dL (ref 8.6–10.3)
Chloride: 100 mmol/L (ref 98–110)
Creat: 1.16 mg/dL (ref 0.70–1.18)
GFR, Est African American: 73 mL/min/{1.73_m2} (ref >=60)
GFR, Est Non African American: 63 mL/min/{1.73_m2} (ref >=60)
Globulin: 2.8 g/dL (calc) (ref 1.9–3.7)
Glucose, Bld: 218 mg/dL — ABNORMAL HIGH (ref 65–99)
Potassium: 5 mmol/L (ref 3.5–5.3)
Sodium: 133 mmol/L — ABNORMAL LOW (ref 135–146)
Total Bilirubin: 0.8 mg/dL (ref 0.2–1.2)
Total Protein: 7.1 g/dL (ref 6.1–8.1)

## 2018-01-23 LAB — LIPID PANEL
Cholesterol: 112 mg/dL (ref <200)
HDL: 30 mg/dL — ABNORMAL LOW (ref >40)
LDL Cholesterol (Calc): 64 mg/dL (calc)
Non-HDL Cholesterol (Calc): 82 mg/dL (calc) (ref <130)
Total CHOL/HDL Ratio: 3.7 (calc) (ref <5.0)
Triglycerides: 96 mg/dL (ref <150)

## 2018-01-23 LAB — TSH: TSH: 0.26 mIU/L — ABNORMAL LOW (ref 0.40–4.50)

## 2018-01-23 LAB — VITAMIN D 25 HYDROXY (VIT D DEFICIENCY, FRACTURES): Vit D, 25-Hydroxy: 53 ng/mL (ref 30–100)

## 2018-01-23 LAB — HEMOGLOBIN A1C
Hgb A1c MFr Bld: 7.3 % of total Hgb — ABNORMAL HIGH (ref <5.7)
Mean Plasma Glucose: 163 (calc)
eAG (mmol/L): 9 (calc)

## 2018-01-30 LAB — HM DIABETES EYE EXAM

## 2018-01-31 ENCOUNTER — Other Ambulatory Visit: Payer: Self-pay

## 2018-01-31 ENCOUNTER — Encounter (HOSPITAL_BASED_OUTPATIENT_CLINIC_OR_DEPARTMENT_OTHER): Payer: Self-pay | Admitting: *Deleted

## 2018-02-04 ENCOUNTER — Encounter: Payer: Self-pay | Admitting: *Deleted

## 2018-02-05 NOTE — H&P (Signed)
   Nathan Davis  Location: [facility] Patient #: [id number] DOB: 1945-10-16 Married / Language: English / Race: Black or African American Male   History of Present Illness Nathan Davis A. Ninfa [name]; The patient is a 72 year old male who presents with an inguinal hernia. This patient has bilateral inguinal hernias. The left is symptomatic. He saw my partner, Dr. Nadeen Landau, who does not do laparoscopic inguinal hernia so he is been referred to me. He currently has no obstructive symptoms.   Allergies Malachi Bonds, CMA;   Amaryl *ANTIDIABETICS*  Codeine/Codeine Derivatives  Pioglitazone HCl *ANTIDIABETICS*   Medication History  DiazePAM (5MG  Tablet, Oral) Active. MetFORMIN HCl (1000MG  Tablet, Oral) Active. Meloxicam (7.5MG  Tablet, Oral) Active. Atorvastatin Calcium (80MG  Tablet, Oral) Active. Atenolol (25MG  Tablet, Oral) Active. Oxycodone-Acetaminophen (5-325MG  Tablet, Oral) Active. Levemir FlexTouch (100UNIT/ML Soln Pen-inj, Subcutaneous) Active. Aspirin (81MG  Tablet DR, Oral) Active. Farxiga (10MG  Tablet, Oral) Active. Loratadine (10MG  Tablet, Oral) Active. Medications Reconciled  Vitals   Weight: 187.6 lb Height: 68in Body Surface Area: 1.99 m Body Mass Index: 28.52 kg/m  Temp.: 98.67F(Oral)  Pulse: 90 (Regular)  BP: 130/72 (Sitting, Left Arm, Standard)       Physical Exam (Lazarius Rivkin A. Ninfa [name];  The physical exam findings are as follows: Note:On examination, he has bilateral reducible inguinal hernias with the left being larger than the right and slightly tender Lungs clear CV RRR' Skin without rash   Assessment & Plan (Lucas Exline A. Ninfa [name];   BILATERAL INGUINAL HERNIA (K40.20)  Impression: I discussed laparoscopic bilateral inguinal hernia repair with mesh with the patient and his family. We discussed the surgical procedure in detail. I discussed the risk which includes but is not limited to  bleeding, infection, injury to surrounding structures, the need to convert to an open procedure, use of mesh, nerve entrapment, chronic pain, hernia recurrence, postoperative recovery, cardiopulmonary issues, etc. He understands and wished to proceed with surgery which will be scheduled

## 2018-02-06 ENCOUNTER — Ambulatory Visit (HOSPITAL_BASED_OUTPATIENT_CLINIC_OR_DEPARTMENT_OTHER)
Admission: RE | Admit: 2018-02-06 | Discharge: 2018-02-06 | Disposition: A | Discharge status: Home / Self Care | Payer: Medicare Other | Source: Ambulatory Visit | Attending: Surgery | Admitting: Surgery

## 2018-02-06 ENCOUNTER — Ambulatory Visit (HOSPITAL_BASED_OUTPATIENT_CLINIC_OR_DEPARTMENT_OTHER): Payer: Medicare Other | Admitting: Anesthesiology

## 2018-02-06 ENCOUNTER — Other Ambulatory Visit: Payer: Self-pay | Admitting: Surgery

## 2018-02-06 ENCOUNTER — Encounter (HOSPITAL_BASED_OUTPATIENT_CLINIC_OR_DEPARTMENT_OTHER)
Admission: RE | Disposition: A | Discharge status: Home / Self Care | Payer: Self-pay | Source: Ambulatory Visit | Attending: Surgery

## 2018-02-06 ENCOUNTER — Encounter (HOSPITAL_BASED_OUTPATIENT_CLINIC_OR_DEPARTMENT_OTHER): Payer: Self-pay | Admitting: Anesthesiology

## 2018-02-06 DIAGNOSIS — E119 Type 2 diabetes mellitus without complications: Secondary | ICD-10-CM | POA: Diagnosis not present

## 2018-02-06 DIAGNOSIS — C9110 Chronic lymphocytic leukemia of B-cell type not having achieved remission: Secondary | ICD-10-CM | POA: Insufficient documentation

## 2018-02-06 DIAGNOSIS — K4091 Unilateral inguinal hernia, without obstruction or gangrene, recurrent: Secondary | ICD-10-CM | POA: Insufficient documentation

## 2018-02-06 DIAGNOSIS — Z79899 Other long term (current) drug therapy: Secondary | ICD-10-CM | POA: Insufficient documentation

## 2018-02-06 DIAGNOSIS — Z7982 Long term (current) use of aspirin: Secondary | ICD-10-CM | POA: Insufficient documentation

## 2018-02-06 DIAGNOSIS — K4090 Unilateral inguinal hernia, without obstruction or gangrene, not specified as recurrent: Secondary | ICD-10-CM | POA: Insufficient documentation

## 2018-02-06 DIAGNOSIS — K449 Diaphragmatic hernia without obstruction or gangrene: Secondary | ICD-10-CM | POA: Insufficient documentation

## 2018-02-06 DIAGNOSIS — Z794 Long term (current) use of insulin: Secondary | ICD-10-CM | POA: Diagnosis not present

## 2018-02-06 DIAGNOSIS — I10 Essential (primary) hypertension: Secondary | ICD-10-CM | POA: Insufficient documentation

## 2018-02-06 DIAGNOSIS — Z7984 Long term (current) use of oral hypoglycemic drugs: Secondary | ICD-10-CM | POA: Insufficient documentation

## 2018-02-06 HISTORY — PX: INGUINAL HERNIA REPAIR: SHX194

## 2018-02-06 HISTORY — PX: INSERTION OF MESH: SHX5868

## 2018-02-06 LAB — CBC
HCT: 31.3 % — ABNORMAL LOW (ref 39.0–52.0)
Hemoglobin: 9.2 g/dL — ABNORMAL LOW (ref 13.0–17.0)
MCH: 22.2 pg — ABNORMAL LOW (ref 26.0–34.0)
MCHC: 29.4 g/dL — ABNORMAL LOW (ref 30.0–36.0)
MCV: 75.4 fL — ABNORMAL LOW (ref 78.0–100.0)
Platelets: 185 10*3/uL (ref 150–400)
RBC: 4.15 MIL/uL — ABNORMAL LOW (ref 4.22–5.81)
RDW: 17 % — ABNORMAL HIGH (ref 11.5–15.5)
WBC: 86.5 10*3/uL (ref 4.0–10.5)

## 2018-02-06 LAB — GLUCOSE, CAPILLARY
Glucose-Capillary: 77 mg/dL (ref 65–99)
Glucose-Capillary: 132 mg/dL — ABNORMAL HIGH (ref 65–99)

## 2018-02-06 SURGERY — REPAIR, HERNIA, INGUINAL, BILATERAL, LAPAROSCOPIC
Anesthesia: General | Site: Groin | Laterality: Bilateral

## 2018-02-06 MED ORDER — LIDOCAINE HCL (CARDIAC) PF 100 MG/5ML IV SOSY
PREFILLED_SYRINGE | INTRAVENOUS | Status: DC | PRN
  Administered 2018-02-06: 30 mg via INTRAVENOUS

## 2018-02-06 MED ORDER — MEPERIDINE HCL 25 MG/ML IJ SOLN: 6.2500 mg | INTRAMUSCULAR | Status: DC | PRN

## 2018-02-06 MED ORDER — FENTANYL CITRATE (PF) 100 MCG/2ML IJ SOLN: 25.0000 ug | INTRAMUSCULAR | Status: DC | PRN

## 2018-02-06 MED ORDER — TRAMADOL HCL 50 MG PO TABS: 50.0000 mg | ORAL_TABLET | Freq: Four times a day (QID) | ORAL | 0 refills | Status: DC | PRN

## 2018-02-06 MED ORDER — ONDANSETRON HCL 4 MG/2ML IJ SOLN
INTRAMUSCULAR | Status: DC | PRN
  Administered 2018-02-06: 4 mg via INTRAVENOUS

## 2018-02-06 MED ORDER — LACTATED RINGERS IV SOLN
INTRAVENOUS | Status: DC
  Administered 2018-02-06 (×2): via INTRAVENOUS

## 2018-02-06 MED ORDER — FENTANYL CITRATE (PF) 100 MCG/2ML IJ SOLN
INTRAMUSCULAR | Status: DC | PRN
  Administered 2018-02-06: 25 ug via INTRAVENOUS
  Administered 2018-02-06: 100 ug via INTRAVENOUS
  Administered 2018-02-06 (×2): 25 ug via INTRAVENOUS

## 2018-02-06 MED ORDER — DEXAMETHASONE SODIUM PHOSPHATE 4 MG/ML IJ SOLN
INTRAMUSCULAR | Status: DC | PRN
  Administered 2018-02-06: 10 mg via INTRAVENOUS

## 2018-02-06 MED ORDER — CHLORHEXIDINE GLUCONATE CLOTH 2 % EX PADS: 6.0000 | MEDICATED_PAD | Freq: Once | CUTANEOUS | Status: DC

## 2018-02-06 MED ORDER — FENTANYL CITRATE (PF) 100 MCG/2ML IJ SOLN
INTRAMUSCULAR | Status: AC
  Filled 2018-02-06: qty 2

## 2018-02-06 MED ORDER — GABAPENTIN 300 MG PO CAPS
ORAL_CAPSULE | ORAL | Status: AC
  Filled 2018-02-06: qty 1

## 2018-02-06 MED ORDER — ACETAMINOPHEN 500 MG PO TABS
1000.0000 mg | ORAL_TABLET | ORAL | Status: AC
  Administered 2018-02-06: 1000 mg via ORAL

## 2018-02-06 MED ORDER — CEFAZOLIN SODIUM-DEXTROSE 2-4 GM/100ML-% IV SOLN
2.0000 g | INTRAVENOUS | Status: AC
  Administered 2018-02-06: 2 g via INTRAVENOUS

## 2018-02-06 MED ORDER — MIDAZOLAM HCL 2 MG/2ML IJ SOLN: 1.0000 mg | INTRAMUSCULAR | Status: DC | PRN

## 2018-02-06 MED ORDER — TRAMADOL HCL 50 MG PO TABS
ORAL_TABLET | ORAL | Status: AC
  Filled 2018-02-06: qty 1

## 2018-02-06 MED ORDER — GABAPENTIN 300 MG PO CAPS: 300.0000 mg | ORAL_CAPSULE | ORAL | Status: DC

## 2018-02-06 MED ORDER — GABAPENTIN 300 MG PO CAPS
300.0000 mg | ORAL_CAPSULE | ORAL | Status: AC
  Administered 2018-02-06: 300 mg via ORAL

## 2018-02-06 MED ORDER — TRAMADOL HCL 50 MG PO TABS
50.0000 mg | ORAL_TABLET | Freq: Once | ORAL | Status: AC
  Administered 2018-02-06: 50 mg via ORAL

## 2018-02-06 MED ORDER — DEXAMETHASONE SODIUM PHOSPHATE 10 MG/ML IJ SOLN
INTRAMUSCULAR | Status: AC
  Filled 2018-02-06: qty 1

## 2018-02-06 MED ORDER — PROPOFOL 10 MG/ML IV BOLUS
INTRAVENOUS | Status: AC
  Filled 2018-02-06: qty 20

## 2018-02-06 MED ORDER — PROPOFOL 10 MG/ML IV BOLUS
INTRAVENOUS | Status: DC | PRN
  Administered 2018-02-06: 200 mg via INTRAVENOUS

## 2018-02-06 MED ORDER — ONDANSETRON HCL 4 MG/2ML IJ SOLN
INTRAMUSCULAR | Status: AC
  Filled 2018-02-06: qty 2

## 2018-02-06 MED ORDER — CEFAZOLIN SODIUM-DEXTROSE 2-4 GM/100ML-% IV SOLN: 2.0000 g | INTRAVENOUS | Status: DC

## 2018-02-06 MED ORDER — SUCCINYLCHOLINE CHLORIDE 20 MG/ML IJ SOLN
INTRAMUSCULAR | Status: DC | PRN
  Administered 2018-02-06: 50 mg via INTRAVENOUS

## 2018-02-06 MED ORDER — ACETAMINOPHEN 500 MG PO TABS: 1000.0000 mg | ORAL_TABLET | ORAL | Status: DC

## 2018-02-06 MED ORDER — ACETAMINOPHEN 500 MG PO TABS
ORAL_TABLET | ORAL | Status: AC
  Filled 2018-02-06: qty 2

## 2018-02-06 MED ORDER — DIPHENHYDRAMINE HCL 50 MG/ML IJ SOLN
INTRAMUSCULAR | Status: AC
  Filled 2018-02-06: qty 1

## 2018-02-06 MED ORDER — FENTANYL CITRATE (PF) 100 MCG/2ML IJ SOLN: 50.0000 ug | INTRAMUSCULAR | Status: DC | PRN

## 2018-02-06 MED ORDER — CEFAZOLIN SODIUM-DEXTROSE 2-4 GM/100ML-% IV SOLN
INTRAVENOUS | Status: AC
  Filled 2018-02-06: qty 100

## 2018-02-06 MED ORDER — DIPHENHYDRAMINE HCL 50 MG/ML IJ SOLN
12.5000 mg | Freq: Once | INTRAMUSCULAR | Status: AC
  Administered 2018-02-06: 12.5 mg via INTRAVENOUS

## 2018-02-06 MED ORDER — PROMETHAZINE HCL 25 MG/ML IJ SOLN: 6.2500 mg | INTRAMUSCULAR | Status: DC | PRN

## 2018-02-06 MED ORDER — SCOPOLAMINE 1 MG/3DAYS TD PT72: 1.0000 | MEDICATED_PATCH | Freq: Once | TRANSDERMAL | Status: DC | PRN

## 2018-02-06 MED ORDER — BUPIVACAINE HCL (PF) 0.5 % IJ SOLN
INTRAMUSCULAR | Status: DC | PRN
  Administered 2018-02-06: 20 mL

## 2018-02-06 SURGICAL SUPPLY — 27 items
ADH SKN CLS APL DERMABOND .7 (GAUZE/BANDAGES/DRESSINGS) ×1
BLADE CLIPPER SURG (BLADE) ×2 IMPLANT
CHLORAPREP W/TINT 26ML (MISCELLANEOUS) ×3 IMPLANT
DERMABOND ADVANCED (GAUZE/BANDAGES/DRESSINGS) ×2
DERMABOND ADVANCED .7 DNX12 (GAUZE/BANDAGES/DRESSINGS) ×1 IMPLANT
DEVICE SECURE STRAP 25 ABSORB (INSTRUMENTS) ×3 IMPLANT
DISSECT BALLN SPACEMKR + OVL (BALLOONS) ×3
DISSECTOR BALLN SPACEMKR + OVL (BALLOONS) ×1 IMPLANT
ELECT REM PT RETURN 9FT ADLT (ELECTROSURGICAL) ×3
ELECTRODE REM PT RTRN 9FT ADLT (ELECTROSURGICAL) ×1 IMPLANT
GLOVE BIOGEL PI IND STRL 7.0 (GLOVE) IMPLANT
GLOVE BIOGEL PI INDICATOR 7.0 (GLOVE) ×2
GLOVE ECLIPSE 6.5 STRL STRAW (GLOVE) ×2 IMPLANT
GLOVE SURG SIGNA 7.5 PF LTX (GLOVE) ×3 IMPLANT
GOWN STRL REUS W/ TWL LRG LVL3 (GOWN DISPOSABLE) ×3 IMPLANT
GOWN STRL REUS W/TWL LRG LVL3 (GOWN DISPOSABLE) ×6
MESH 3DMAX 4X6 LT LRG (Mesh General) ×3 IMPLANT
MESH 3DMAX 4X6 RT LRG (Mesh General) ×3 IMPLANT
NS IRRIG 1000ML POUR BTL (IV SOLUTION) ×3 IMPLANT
PACK BASIN DAY SURGERY FS (CUSTOM PROCEDURE TRAY) ×3 IMPLANT
SCISSORS LAP 5X35 DISP (ENDOMECHANICALS) ×2 IMPLANT
SET TROCAR LAP APPLE-HUNT 5MM (ENDOMECHANICALS) ×3 IMPLANT
SLEEVE SCD COMPRESS KNEE MED (MISCELLANEOUS) ×3 IMPLANT
SUT MNCRL AB 4-0 PS2 18 (SUTURE) ×3 IMPLANT
TOWEL OR 17X24 6PK STRL BLUE (TOWEL DISPOSABLE) ×3 IMPLANT
TRAY LAPAROSCOPIC (CUSTOM PROCEDURE TRAY) ×3 IMPLANT
TUBING INSUFFLATION (TUBING) ×3 IMPLANT

## 2018-02-06 NOTE — Transfer of Care (Signed)
Immediate Anesthesia Transfer of Care Note  Patient: Nathan Davis  Procedure(s) Performed: LAPAROSCOPIC BILATERAL INGUINAL HERNIA REPAIR (Bilateral Groin) INSERTION OF MESH (Bilateral Groin)  Patient Location: PACU  Anesthesia Type:General  Level of Consciousness: awake, alert  and oriented  Airway & Oxygen Therapy: Patient Spontanous Breathing and Patient connected to face mask oxygen  Post-op Assessment: Report given to RN and Post -op Vital signs reviewed and stable  Post vital signs: Reviewed and stable  Last Vitals:  Vitals Value Taken Time  BP    Temp    Pulse 79 02/06/2018 11:04 AM  Resp 15 02/06/2018 11:04 AM  SpO2 98 % 02/06/2018 11:04 AM  Vitals shown include unvalidated device data.  Last Pain:  Vitals:   02/06/18 0824  TempSrc: Oral  PainSc: 0-No pain      Patients Stated Pain Goal: 3 (83/66/29 4765)  Complications: No apparent anesthesia complications

## 2018-02-06 NOTE — Anesthesia Preprocedure Evaluation (Addendum)
Anesthesia Evaluation  Patient identified by MRN, date of birth, ID band Patient awake    Reviewed: Allergy & Precautions, NPO status , Patient's Chart, lab work & pertinent test results, reviewed documented beta blocker date and time   Airway Mallampati: II  TM Distance: >3 FB Neck ROM: Full    Dental  (+) Edentulous Upper, Edentulous Lower   Pulmonary neg pulmonary ROS,    Pulmonary exam normal breath sounds clear to auscultation       Cardiovascular hypertension, Pt. on medications and Pt. 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   Anesthesia Other Findings   Reproductive/Obstetrics                            Anesthesia Physical Anesthesia Plan  ASA: III  Anesthesia Plan: General   Post-op Pain Management:    Induction: Intravenous  PONV Risk Score and Plan: 4 or greater and Ondansetron, Dexamethasone and Midazolam  Airway Management Planned: Oral ETT  Additional Equipment:   Intra-op Plan:   Post-operative Plan: Extubation in OR  Informed Consent: I have reviewed the patients History and Physical, chart, labs and discussed the procedure including the risks, benefits and alternatives for the proposed anesthesia with the patient or authorized representative who has indicated his/her understanding and acceptance.   Dental advisory given  Plan Discussed with: CRNA, Anesthesiologist  and Surgeon  Anesthesia Plan Comments:         Anesthesia Quick Evaluation

## 2018-02-06 NOTE — Op Note (Signed)
LAPAROSCOPIC BILATERAL INGUINAL HERNIA REPAIR, INSERTION OF MESH  Procedure Note  Nathan Davis 02/06/2018   Pre-op Diagnosis: Bilateral inguinal hernia     Post-op Diagnosis: same  Procedure(s): LAPAROSCOPIC BILATERAL INGUINAL HERNIA REPAIR INSERTION OF MESH  Surgeon(s): Coralie Keens, MD  Anesthesia: General  Staff:  Circulator: Harrel Lemon, RN Relief Circulator: Lynelle Doctor, RN Relief Scrub: Leroy Libman, RN Scrub Person: Izora Ribas, RN  Estimated Blood Loss: Minimal               Findings: The patient was found to have bilateral indirect inguinal hernias with the left being larger than the right.  The right inguinal hernia is Davis recurrent inguinal hernia.  Both were repaired with Bard 3 DMax large Prolene mesh  Procedure: The patient was brought to the operating room and identified as the correct patient.  He was placed upon in the operating room table and general anesthesia was induced.  His abdomen was then prepped and draped in the usual sterile fashion.  I made Davis small vertical incision below the umbilicus.  I carried this down the fascia which was opened just to the right of the midline.  The rectus muscle was identified and elevated.  I passed the dissecting balloon down underneath the rectus sheath and manipulated toward the pubis.  The dissecting balloon was then insufflated under direct vision dissecting out the preperitoneal space.  I then removed the dissecting balloon and insufflation was begun with carbon dioxide.  I placed 2 5 mill meter trocar as the patient's lower midline both under direct vision.  I addressed the right inguinal area first.  The patient had visible mesh and Davis recurrent indirect inguinal hernia.  I was able to reduce the hernia sac from the cord structures and reduce the hernia.  I then turned my attention to the left inguinal hernia.  The patient had Davis much larger sac on the side.  I did use left excision as well to transect  the sac and finally reduce it from the testicular cord and structures.  Next Davis left-sided piece of Bard 3 DMax Prolene mesh were brought to the field.  I placed it at through the umbilical trocar and opened as an onlay on the left inguinal floor.  It was then tacked to Cooper's ligament of the medial abdominal wall and slightly laterally with the absorbable tacker.  Davis right side of Prolene 3 DMax large mesh was brought to the field again and placed the umbilical trocar and placed as an onlay on the right inguinal floor again it was tacked up the medial abdominal wall after talking to Cooper's ligament and is slightly laterally area wide coverage of the testicular cord and defects appear to be achieved.  Hemostasis appeared to be achieved.  All ports were then removed under direct vision the preperitoneal space was seen to collapse appropriately with the mesh in place.  I then closed the fascia the umbilicus with 0 Vicryl suture.  All incisions were anesthetized with Marcaine.  I performed bilateral ilioinguinal nerve blocks with Marcaine as well.  All skin incisions were then closed with 4-0 Monocryl sutures.  Skin glue was then applied.  The patient tolerated procedure well.  All the counts were correct at the end the procedure.  The patient was then extubated in the operating room and taken in Davis stable condition to the recovery room.          Nathan Davis   Date: 02/06/2018  Time: 10:56 AM

## 2018-02-06 NOTE — Anesthesia Procedure Notes (Signed)
Procedure Name: Intubation Date/Time: 02/06/2018 10:00 AM Performed by: Marrianne Mood, CRNA Pre-anesthesia Checklist: Patient identified, Emergency Drugs available, Suction available, Patient being monitored and Timeout performed Patient Re-evaluated:Patient Re-evaluated prior to induction Oxygen Delivery Method: Circle system utilized Preoxygenation: Pre-oxygenation with 100% oxygen Induction Type: IV induction Ventilation: Mask ventilation without difficulty Laryngoscope Size: Miller and 3 Grade View: Grade II Tube type: Oral Tube size: 8.0 mm Number of attempts: 1 Airway Equipment and Method: Stylet and Oral airway Placement Confirmation: ETT inserted through vocal cords under direct vision,  positive ETCO2 and breath sounds checked- equal and bilateral Secured at: 22 cm Tube secured with: Tape Dental Injury: Teeth and Oropharynx as per pre-operative assessment

## 2018-02-06 NOTE — Interval H&P Note (Signed)
History and Physical Interval Note: no change in H and P  02/06/2018 8:05 AM  Nathan Davis  has presented today for surgery, with the diagnosis of Bilateral inguinal hernia  The various methods of treatment have been discussed with the patient and family. After consideration of risks, benefits and other options for treatment, the patient has consented to  Procedure(s): [place] (Bilateral) INSERTION OF MESH (Bilateral) as a surgical intervention .  The patient's history has been reviewed, patient examined, no change in status, stable for surgery.  I have reviewed the patient's chart and labs.  Questions were answered to the patient's satisfaction.     Nathan Davis A

## 2018-02-06 NOTE — Discharge Instructions (Signed)
[facility] _______[facility]  UMBILICAL OR INGUINAL HERNIA REPAIR: POST OP INSTRUCTIONS  Always review your discharge instruction sheet given to you by the facility where your surgery was performed. IF YOU HAVE DISABILITY OR FAMILY LEAVE FORMS, YOU MUST BRING THEM TO THE OFFICE FOR PROCESSING.   DO NOT GIVE THEM TO YOUR DOCTOR.  1. A  prescription for pain medication may be given to you upon discharge.  Take your pain medication as prescribed, if needed.  If narcotic pain medicine is not needed, then you may take acetaminophen (Tylenol) or ibuprofen (Advil) as needed. 2. Take your usually prescribed medications unless otherwise directed. No Tylenol until 2:30pm! If you need a refill on your pain medication, please contact your pharmacy.  They will contact our office to request authorization. Prescriptions will not be filled after 5 pm or on week-ends. 3. You should follow a light diet the first 24 hours after arrival home, such as soup and crackers, etc.  Be sure to include lots of fluids daily.  Resume your normal diet the day after surgery. 4.Most patients will experience some swelling and bruising around the umbilicus or in the groin and scrotum.  Ice packs and reclining will help.  Swelling and bruising can take several days to resolve.  6. It is common to experience some constipation if taking pain medication after surgery.  Increasing fluid intake and taking a stool softener (such as Colace) will usually help or prevent this problem from occurring.  A mild laxative (Milk of Magnesia or Miralax) should be taken according to package directions if there are no bowel movements after 48 hours. 7. Unless discharge instructions indicate otherwise, you may remove your bandages 24-48 hours after surgery, and you may shower at that time.  You may have steri-strips (small skin tapes) in place directly over the incision.  These strips should be left on the skin for 7-10 days.  If your surgeon used  skin glue on the incision, you may shower in 24 hours.  The glue will flake off over the next 2-3 weeks.  Any sutures or staples will be removed at the office during your follow-up visit. 8. ACTIVITIES:  You may resume regular (light) daily activities beginning the next day--such as daily self-care, walking, climbing stairs--gradually increasing activities as tolerated.  You may have sexual intercourse when it is comfortable.  Refrain from any heavy lifting or straining until approved by your doctor.  a.You may drive when you are no longer taking prescription pain medication, you can comfortably wear a seatbelt, and you can safely maneuver your car and apply brakes. b.RETURN TO WORK:   _____________________________________________  9.You should see your doctor in the office for a follow-up appointment approximately 2-3 weeks after your surgery.  Make sure that you call for this appointment within a day or two after you arrive home to insure a convenient appointment time. 10.OTHER INSTRUCTIONS: __OK TO SHOWER STARTING TOMORROW ICE PACK, TYLENOL, IBUPROFEN ALSO FOR PAIN NO LIFTING MORE THAN 15 POUNDS FOR 3 WEEKS_______________________    _____________________________________  WHEN TO CALL YOUR DOCTOR: 1. Fever over 101.0 2. Inability to urinate 3. Nausea and/or vomiting 4. Extreme swelling or bruising 5. Continued bleeding from incision. 6. Increased pain, redness, or drainage from the incision  The clinic staff is available to answer your questions during regular business hours.  Please dont hesitate to call and ask to speak to one of the nurses for clinical concerns.  If you have a medical emergency, go  to the nearest emergency room or call 911.  A surgeon from [facility] is always on call at the hospital   [address] [place], [place], [place]  [address] ?  [address], [place], [place]   [address] [phone] ? [phone] ? FAX [phone] Web site:  [email or web address]    Post Anesthesia Home Care Instructions  Activity: Get plenty of rest for the remainder of the day. A responsible individual must stay with you for 24 hours following the procedure.  For the next 24 hours, DO NOT: -Drive a car -Paediatric nurse -Drink alcoholic beverages -Take any medication unless instructed by your physician -Make any legal decisions or sign important papers.  Meals: Start with liquid foods such as gelatin or soup. Progress to regular foods as tolerated. Avoid greasy, spicy, heavy foods. If nausea and/or vomiting occur, drink only clear liquids until the nausea and/or vomiting subsides. Call your physician if vomiting continues.  Special Instructions/Symptoms: Your throat may feel dry or sore from the anesthesia or the breathing tube placed in your throat during surgery. If this causes discomfort, gargle with warm salt water. The discomfort should disappear within 24 hours.  If you had a scopolamine patch placed behind your ear for the management of post- operative nausea and/or vomiting:  1. The medication in the patch is effective for 72 hours, after which it should be removed.  Wrap patch in a tissue and discard in the trash. Wash hands thoroughly with soap and water. 2. You may remove the patch earlier than 72 hours if you experience unpleasant side effects which may include dry mouth, dizziness or visual disturbances. 3. Avoid touching the patch. Wash your hands with soap and water after contact with the patch.

## 2018-02-06 NOTE — Anesthesia Postprocedure Evaluation (Signed)
Anesthesia Post Note  Patient: Nathan Davis  Procedure(s) Performed: LAPAROSCOPIC BILATERAL INGUINAL HERNIA REPAIR (Bilateral Groin) INSERTION OF MESH (Bilateral Groin)     Patient location during evaluation: PACU Anesthesia Type: General Level of consciousness: awake and alert and oriented Pain management: pain level controlled Vital Signs Assessment: post-procedure vital signs reviewed and stable Respiratory status: spontaneous breathing, nonlabored ventilation and respiratory function stable Cardiovascular status: blood pressure returned to baseline and stable Postop Assessment: no apparent nausea or vomiting Anesthetic complications: no    Last Vitals:  Vitals:   02/06/18 1200 02/06/18 1250  BP: 140/72 (!) 157/87  Pulse: 73 84  Resp: 15 14  Temp:  36.6 C  SpO2: 94% 97%    Last Pain:  Vitals:   02/06/18 1250  TempSrc: Oral  PainSc: 8                  Messiah Ahr A.

## 2018-02-07 ENCOUNTER — Encounter (HOSPITAL_BASED_OUTPATIENT_CLINIC_OR_DEPARTMENT_OTHER): Payer: Self-pay | Admitting: Surgery

## 2018-02-26 ENCOUNTER — Ambulatory Visit: Payer: Self-pay

## 2018-03-05 ENCOUNTER — Ambulatory Visit (INDEPENDENT_AMBULATORY_CARE_PROVIDER_SITE_OTHER): Payer: Medicare Other

## 2018-03-05 DIAGNOSIS — R7989 Other specified abnormal findings of blood chemistry: Secondary | ICD-10-CM

## 2018-03-05 LAB — TSH: TSH: 0.63 mIU/L (ref 0.40–4.50)

## 2018-03-05 NOTE — Progress Notes (Signed)
Pt reports for TSH bld lab. Pt states he is not taking any meds for thyroid.

## 2018-03-17 ENCOUNTER — Other Ambulatory Visit: Payer: Self-pay | Admitting: Internal Medicine

## 2018-03-25 ENCOUNTER — Ambulatory Visit: Payer: Medicare Other | Admitting: Internal Medicine

## 2018-03-25 ENCOUNTER — Encounter: Payer: Self-pay | Admitting: Internal Medicine

## 2018-03-25 VITALS — BP 106/62 | HR 88 | Temp 97.5°F | Resp 16 | Ht 68.0 in | Wt 167.6 lb

## 2018-03-25 DIAGNOSIS — Z79899 Other long term (current) drug therapy: Secondary | ICD-10-CM | POA: Diagnosis not present

## 2018-03-25 DIAGNOSIS — R058 Other specified cough: Secondary | ICD-10-CM

## 2018-03-25 DIAGNOSIS — R05 Cough: Secondary | ICD-10-CM | POA: Diagnosis not present

## 2018-03-25 DIAGNOSIS — R634 Abnormal weight loss: Secondary | ICD-10-CM | POA: Diagnosis not present

## 2018-03-25 DIAGNOSIS — E1122 Type 2 diabetes mellitus with diabetic chronic kidney disease: Secondary | ICD-10-CM | POA: Diagnosis not present

## 2018-03-25 DIAGNOSIS — Z794 Long term (current) use of insulin: Secondary | ICD-10-CM | POA: Diagnosis not present

## 2018-03-25 DIAGNOSIS — N182 Chronic kidney disease, stage 2 (mild): Secondary | ICD-10-CM

## 2018-03-25 DIAGNOSIS — R350 Frequency of micturition: Secondary | ICD-10-CM | POA: Diagnosis not present

## 2018-03-25 MED ORDER — MIRTAZAPINE 30 MG PO TABS: ORAL_TABLET | ORAL | 2 refills | Status: AC

## 2018-03-25 NOTE — Progress Notes (Signed)
This very nice 72 y.o. MBM with with HTN, HLD, Pre-Diabetes and Vitamin D Deficiency.  Patient has stage 0 CLL and is followed by Dr Alen Blew. Patient is brought in today by his wife for concerns of weight loss from wt 198# in Dec 2018 to current wt 167.8#. Patient reports recent cough with occasional scant amounts of clear to white mucoid sputum. He has never been a smoker , but has been exposed to 2sd hand smoke from his wife. He does report mild DOE. Patient does endorse eating smaller portion sizes and "no appetite".      Patient is treated for HTN (2011)  & BP has been controlled at home. Today's BP is at goal - 106/62. In 2011, he had a negative Stress by Dr Aundra Dubin.  Patient has had no complaints of any cardiac type chest pain, palpitations, dyspnea / orthopnea / PND, dizziness, claudication, or dependent edema.     Hyperlipidemia is controlled with diet & meds. Patient denies myalgias or other med SE's. 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  Last A1c was not at goal (70-100): Lab Results  Component Value Date   HGBA1C 7.3 (H) 01/22/2018      Further, the patient also has history of Vitamin D Deficiency ("34"/2015) and supplements vitamin D without any suspected side-effects. Last vitamin D was sl low (goal 70-100): Lab Results  Component Value Date   VD25OH 53 01/22/2018   Current Outpatient Medications on File Prior to Visit  Medication Sig  . acetaminophen (TYLENOL) 500 MG tablet Take 1,000 mg by mouth every 6 (six) hours as needed for moderate pain or headache.  Marland Kitchen aspirin 81 MG tablet Take 81 mg by mouth daily.  Marland Kitchen atenolol (TENORMIN) 25 MG tablet TAKE  ONE TABLET BY MOUTH AT BEDTIME (Patient taking differently: TAKE 25 MG BY MOUTH AT BEDTIME)  . atorvastatin (LIPITOR) 80 MG tablet TAKE 40 MG BY MOUTH DAILY  . Cholecalciferol (VITAMIN D3) 1000 units CAPS Take 2,000 Units by mouth daily.  . Insulin Detemir (LEVEMIR FLEXTOUCH) 100 UNIT/ML Pen Inject 30 Units into the skin daily.  . Insulin Pen Needle (B-D ULTRAFINE III SHORT PEN) 31G X 8 MM MISC CHECK BLOOD SUGAR TWICE DAILY FOR FLUCTUATIONS AND MEDICATION ADJUSTMENT  . loratadine (CLARITIN) 10 MG tablet Take 1 tablet daily for Allergies (Patient taking differently: Take 10 mg by mouth daily as needed for allergies. )  . metFORMIN (GLUCOPHAGE) 1000 MG tablet TAKE ONE TABLET BY MOUTH TWICE A DAY  . Tetrahydrozoline HCl (VISINE OP) Place 2 drops into both eyes daily as needed (for dry eyes).   No current facility-administered medications on file prior to visit.    Allergies  Allergen Reactions  . Amaryl [Glimepiride] Diarrhea  . Codeine Itching  . Pioglitazone Diarrhea, Itching and Anxiety   PMHx:   Past Medical History:  Diagnosis Date  . Cataract   . CLL (chronic lymphocytic leukemia) ([place])    not treated yet  . Colon polyps   . Diabetes ([place]) 1992   type 2  . ED (erectile dysfunction)   . Femur fracture (  [place])    left  . Heart murmur   . History of hiatal hernia   . Iron (Fe) deficiency anemia   . Obesity   . Other and unspecified hyperlipidemia   . Premature beats, unspecified   . Unspecified essential hypertension   . Vitamin D deficiency    Immunization History  Administered Date(s) Administered  . Influenza, High Dose Seasonal PF 07/22/2013  . Influenza-Unspecified 06/25/2012, 06/10/2017  . PPD Test 08/27/2013  . Pneumococcal Conjugate-13 08/22/2011  . Pneumococcal Polysaccharide-23 05/30/2016  . Td 09/17/2002  . Tdap 08/27/2013  . Zoster 09/17/2005   Colon 02/2010 - Dr Deatra Ina (-) Bx - recc 10 yr f/u due 2021  Past Surgical History:  Procedure Laterality Date  .  ETT - MYOVIEW     7'45", stopped due to fatigue, no chest pain. EF55%, no ischemia or infarction  . HERNIA REPAIR     hiatal hernia  1994 , inguinal scheduled 01-02-18 with dr. Coralie Keens  . INGUINAL HERNIA REPAIR Bilateral 02/06/2018   Procedure: LAPAROSCOPIC BILATERAL INGUINAL HERNIA REPAIR;  Surgeon: Coralie Keens, MD;  Location: [place];  Service: General;  Laterality: Bilateral;  . INSERTION OF MESH Bilateral 02/06/2018   Procedure: INSERTION OF MESH;  Surgeon: Coralie Keens, MD;  Location: [facility];  Service: General;  Laterality: Bilateral;  . nissan fundoplication N/A 87/86/7672   FHx:    Reviewed / unchanged  SHx:    Reviewed / unchanged   Systems Review:  Constitutional: Denies fever, chills, wt changes, headaches, insomnia, fatigue, night sweats, change in appetite. Eyes: Denies redness, blurred vision, diplopia, discharge, itchy, watery eyes.  ENT: Denies discharge, congestion, post nasal drip, epistaxis, sore throat, earache, hearing loss, dental pain, tinnitus, vertigo, sinus pain, snoring.  CV: Denies chest pain, palpitations, irregular heartbeat, syncope, dyspnea, diaphoresis, orthopnea, PND, claudication or edema. Respiratory: denies cough, dyspnea, DOE, pleurisy, hoarseness, laryngitis, wheezing.  Gastrointestinal: Denies dysphagia, odynophagia, heartburn, reflux, water brash, abdominal pain or cramps, nausea, vomiting, bloating, diarrhea, constipation, hematemesis, melena, hematochezia  or hemorrhoids. Genitourinary: Denies dysuria, frequency, urgency, nocturia, hesitancy, discharge, hematuria or flank pain. Musculoskeletal: Denies arthralgias, myalgias, stiffness, jt. swelling, pain, limping or strain/sprain.  Skin: Denies pruritus, rash, hives, warts, acne, eczema or change in skin lesion(s). Neuro: No weakness, tremor, incoordination, spasms, paresthesia or pain. Psychiatric: Denies confusion, memory loss or sensory  loss. Endo: Denies change in weight, skin or hair change.  Heme/Lymph: No excessive bleeding, bruising or enlarged lymph nodes.  Physical Exam  BP 106/62   Pulse 88   Temp (!) 97.5 F (36.4 C)   Resp 16   Ht 5\' 8"  (1.727 m)   Wt 167 lb 9.6 oz (76 kg)   BMI 25.48 kg/m   Appears  well nourished, well groomed  and in no distress.  Eyes: PERRLA, EOMs, conjunctiva no swelling or erythema. Sinuses: No frontal/maxillary tenderness ENT/Mouth: EAC's clear, TM's nl w/o erythema, bulging. Nares clear w/o erythema, swelling, exudates. Oropharynx clear without erythema or exudates. Oral hygiene is good. Tongue normal, non obstructing. Hearing intact.  Neck: Supple. Thyroid not palpable. Car 2+/2+ without bruits, nodes or JVD. Chest: Respirations nl with BS clear & equal w/o rales, rhonchi, wheezing or stridor.  Cor: Heart sounds normal w/ regular rate and rhythm without sig. murmurs, gallops, clicks or rubs. Peripheral pulses normal and equal  without edema.  Abdomen: Soft & bowel sounds normal. Non-tender w/o guarding, rebound, hernias, masses or organomegaly.  Lymphatics: Unremarkable.  Musculoskeletal: Full ROM all  peripheral extremities, joint stability, 5/5 strength and normal gait.  Skin: Warm, dry without exposed rashes, lesions or ecchymosis apparent.  Neuro: Cranial nerves intact, reflexes equal bilaterally. Sensory-motor testing grossly intact. Tendon reflexes grossly intact.  Pysch: Alert & oriented x 3.  Insight and judgement nl & appropriate. No ideations.  Assessment and Plan:  1. Recent unexplained weight loss  - CBC with Differential/Platelet - COMPLETE METABOLIC PANEL WITH GFR - TSH - Urinalysis, Routine w reflex microscopic - DG Chest 2 View; Future - mirtazapine (REMERON) 30 MG tablet; Take 1/2 to 1 tablet 1 hour before bedtime  for appetite & sleep  Dispense: 30 tablet; Refill: 2  2. Urinary frequency  - Urinalysis, Routine w reflex microscopic  3. Type 2 diabetes  mellitus with stage 2 chronic kidney disease, with long-term current use of insulin (HCC)  - COMPLETE METABOLIC PANEL WITH GFR  4. Cough with sputum  - DG Chest 2 View; Future  5. Medication management  - CBC with Differential/Platelet - COMPLETE METABOLIC PANEL WITH GFR - TSH - Urinalysis, Routine w reflex microscopic      Discussed  regular exercise, BP monitoring, weight control to achieve/maintain BMI less than 25 and discussed med and SE's. Recommended labs to assess and monitor clinical status with further disposition pending results of labs. Over 30 minutes of exam, counseling, chart review was performed.

## 2018-03-26 ENCOUNTER — Ambulatory Visit (HOSPITAL_COMMUNITY)
Admission: RE | Admit: 2018-03-26 | Discharge: 2018-03-26 | Disposition: A | Discharge status: Home / Self Care | Payer: Medicare Other | Source: Ambulatory Visit | Attending: Internal Medicine | Admitting: Internal Medicine

## 2018-03-26 ENCOUNTER — Other Ambulatory Visit: Payer: Self-pay | Admitting: Internal Medicine

## 2018-03-26 DIAGNOSIS — R05 Cough: Secondary | ICD-10-CM | POA: Diagnosis not present

## 2018-03-26 DIAGNOSIS — R634 Abnormal weight loss: Secondary | ICD-10-CM | POA: Diagnosis not present

## 2018-03-26 DIAGNOSIS — Z6825 Body mass index (BMI) 25.0-25.9, adult: Secondary | ICD-10-CM | POA: Diagnosis not present

## 2018-03-26 DIAGNOSIS — R058 Other specified cough: Secondary | ICD-10-CM

## 2018-03-26 DIAGNOSIS — C9112 Chronic lymphocytic leukemia of B-cell type in relapse: Secondary | ICD-10-CM

## 2018-03-26 DIAGNOSIS — C9192 Lymphoid leukemia, unspecified, in relapse: Secondary | ICD-10-CM

## 2018-03-26 LAB — URINALYSIS, ROUTINE W REFLEX MICROSCOPIC
Bacteria, UA: NONE SEEN /HPF
Bilirubin Urine: NEGATIVE
Glucose, UA: NEGATIVE
Hgb urine dipstick: NEGATIVE
Hyaline Cast: NONE SEEN /LPF
Ketones, ur: NEGATIVE
Leukocytes, UA: NEGATIVE
Nitrite: NEGATIVE
RBC / HPF: NONE SEEN /HPF (ref 0–2)
Specific Gravity, Urine: 1.018 (ref 1.001–1.03)
Squamous Epithelial / HPF: NONE SEEN /HPF (ref <=5)
WBC, UA: NONE SEEN /HPF (ref 0–5)
pH: 5.5 (ref 5.0–8.0)

## 2018-03-26 LAB — COMPLETE METABOLIC PANEL WITH GFR
AG Ratio: 1.6 (calc) (ref 1.0–2.5)
ALT: 10 U/L (ref 9–46)
AST: 16 U/L (ref 10–35)
Albumin: 4.4 g/dL (ref 3.6–5.1)
Alkaline phosphatase (APISO): 84 U/L (ref 40–115)
BUN/Creatinine Ratio: 16 (calc) (ref 6–22)
BUN: 21 mg/dL (ref 7–25)
CO2: 28 mmol/L (ref 20–32)
Calcium: 10.3 mg/dL (ref 8.6–10.3)
Chloride: 102 mmol/L (ref 98–110)
Creat: 1.28 mg/dL — ABNORMAL HIGH (ref 0.70–1.18)
GFR, Est African American: 64 mL/min/{1.73_m2} (ref >=60)
GFR, Est Non African American: 56 mL/min/{1.73_m2} — ABNORMAL LOW (ref >=60)
Globulin: 2.8 g/dL (calc) (ref 1.9–3.7)
Glucose, Bld: 108 mg/dL — ABNORMAL HIGH (ref 65–99)
Potassium: 4.7 mmol/L (ref 3.5–5.3)
Sodium: 137 mmol/L (ref 135–146)
Total Bilirubin: 0.7 mg/dL (ref 0.2–1.2)
Total Protein: 7.2 g/dL (ref 6.1–8.1)

## 2018-03-26 LAB — CBC WITH DIFFERENTIAL/PLATELET
Basophils Absolute: 122 cells/uL (ref 0–200)
Basophils Relative: 0.1 %
Eosinophils Absolute: 610 cells/uL — ABNORMAL HIGH (ref 15–500)
Eosinophils Relative: 0.5 %
HCT: 29.9 % — ABNORMAL LOW (ref 38.5–50.0)
Hemoglobin: 8.8 g/dL — ABNORMAL LOW (ref 13.2–17.1)
Lymphs Abs: 110929 cells/uL — ABNORMAL HIGH (ref 850–3900)
MCH: 21.7 pg — ABNORMAL LOW (ref 27.0–33.0)
MCHC: 29.4 g/dL — ABNORMAL LOW (ref 32.0–36.0)
MCV: 73.8 fL — ABNORMAL LOW (ref 80.0–100.0)
MPV: 10.5 fL (ref 7.5–12.5)
Monocytes Relative: 4.1 %
Neutro Abs: 5242 cells/uL (ref 1500–7800)
Neutrophils Relative %: 4.3 %
Platelets: 264 10*3/uL (ref 140–400)
RBC: 4.05 10*6/uL — ABNORMAL LOW (ref 4.20–5.80)
RDW: 15.4 % — ABNORMAL HIGH (ref 11.0–15.0)
Total Lymphocyte: 91 %
WBC mixed population: 4998 cells/uL — ABNORMAL HIGH (ref 200–950)
WBC: 121.9 10*3/uL — ABNORMAL HIGH (ref 3.8–10.8)

## 2018-03-26 LAB — TSH: TSH: 0.61 mIU/L (ref 0.40–4.50)

## 2018-03-27 ENCOUNTER — Other Ambulatory Visit: Payer: Self-pay | Admitting: Oncology

## 2018-03-27 ENCOUNTER — Telehealth: Payer: Self-pay | Admitting: Oncology

## 2018-03-27 DIAGNOSIS — C9110 Chronic lymphocytic leukemia of B-cell type not having achieved remission: Secondary | ICD-10-CM

## 2018-03-27 NOTE — Telephone Encounter (Signed)
Called pt re appts per 7/10 sch msg - spoke w/ pt re appts.

## 2018-04-07 ENCOUNTER — Inpatient Hospital Stay: Payer: Medicare Other

## 2018-04-07 ENCOUNTER — Telehealth: Payer: Self-pay

## 2018-04-07 ENCOUNTER — Inpatient Hospital Stay: Payer: Medicare Other | Attending: Oncology | Admitting: Oncology

## 2018-04-07 VITALS — BP 123/66 | HR 87 | Temp 98.4°F | Resp 18 | Ht 68.0 in | Wt 162.2 lb

## 2018-04-07 DIAGNOSIS — G8929 Other chronic pain: Secondary | ICD-10-CM | POA: Diagnosis not present

## 2018-04-07 DIAGNOSIS — C9110 Chronic lymphocytic leukemia of B-cell type not having achieved remission: Secondary | ICD-10-CM

## 2018-04-07 DIAGNOSIS — M545 Low back pain: Secondary | ICD-10-CM | POA: Diagnosis not present

## 2018-04-07 DIAGNOSIS — D72829 Elevated white blood cell count, unspecified: Secondary | ICD-10-CM | POA: Diagnosis not present

## 2018-04-07 DIAGNOSIS — D509 Iron deficiency anemia, unspecified: Secondary | ICD-10-CM | POA: Insufficient documentation

## 2018-04-07 LAB — CBC WITH DIFFERENTIAL (CANCER CENTER ONLY)
Basophils Absolute: 0.4 10*3/uL — ABNORMAL HIGH (ref 0.0–0.1)
Basophils Relative: 1 %
Eosinophils Absolute: 0.7 10*3/uL — ABNORMAL HIGH (ref 0.0–0.5)
Eosinophils Relative: 1 %
HCT: 28.9 % — ABNORMAL LOW (ref 38.4–49.9)
Hemoglobin: 8.7 g/dL — ABNORMAL LOW (ref 13.0–17.1)
Lymphocytes Relative: 90 %
Lymphs Abs: 112.6 10*3/uL — ABNORMAL HIGH (ref 0.9–3.3)
MCH: 22.4 pg — ABNORMAL LOW (ref 27.2–33.4)
MCHC: 30.7 g/dL — ABNORMAL LOW (ref 32.0–36.0)
MCV: 73.2 fL — ABNORMAL LOW (ref 79.3–98.0)
Monocytes Absolute: 2.6 10*3/uL — ABNORMAL HIGH (ref 0.1–0.9)
Monocytes Relative: 2 %
Neutro Abs: 4.9 10*3/uL (ref 1.5–6.5)
Neutrophils Relative %: 6 %
Platelet Count: 191 10*3/uL (ref 140–400)
RBC: 3.87 MIL/uL — ABNORMAL LOW (ref 4.20–5.82)
RDW: 16.2 % — ABNORMAL HIGH (ref 11.0–14.6)
WBC Count: 121.3 10*3/uL (ref 4.0–10.3)

## 2018-04-07 LAB — LACTATE DEHYDROGENASE: LDH: 191 U/L (ref 98–192)

## 2018-04-07 LAB — IRON AND TIBC
Iron: 52 ug/dL (ref 42–163)
Saturation Ratios: 16 % — ABNORMAL LOW (ref 42–163)
TIBC: 323 ug/dL (ref 202–409)
UIBC: 270 ug/dL

## 2018-04-07 LAB — FERRITIN: Ferritin: 198 ng/mL (ref 24–336)

## 2018-04-07 NOTE — Progress Notes (Signed)
Hematology and Oncology Follow Up Visit  Nathan Davis [id number] 1945/10/21 72 y.o. 04/07/2018 8:29 AM Nathan Davis, MDMcKeown, [name], MD   Principle Diagnosis: 72 year old man with CLL diagnosed in April 2017 documented by peripheral blood flow cytometry.  He had elevated white cell count and lymphocytosis on presentation.     Current therapy: Active surveillance.   Interim History:  Nathan Davis presents today for a follow-up.  Since the last visit, he has reported complaints of weight loss and occasional cough.  Despite reasonable appetite he has reported losing close to 20 pounds.  He was evaluated by Nathan Davis and a CBC obtained on 03/25/2018 showed increase in his white cell count.  His white cell count was 121,000 with a hemoglobin of 8.8, platelet count of 264 and increased lymphocytosis.  His white cell count was 86.5 in May 2019, 97.4 on Jan 22, 2018 and was 79,000 in April 2019.  Clinically, Nathan Davis remains relatively asymptomatic.  He denies any excessive fatigue or tiredness.  He believes his appetite has improved in the last few weeks and has been started on Remeron.  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  He still able to drive without any decline in ability to do so.  He denies any painful adenopathy or abdominal distention.  He does report chronic back pain which is unchanged.  He does not report any headaches, blurry vision, syncope or seizures.he denies any alteration mental status or confusion.  He denies any fevers or chills or sweats.  Does not report any chest pain, palpitation, orthopnea or leg edema. He does not report any  hemoptysis. Does not report any nausea, vomiting or abdominal pain. Does not report any constipation, diarrhea, hematochezia or melena. He does not report any frequency urgency or hematuria.  He denies any mood changes.  He denies any lymphadenopathy or petechiae.  He denies any skin rashes  or lesions.  Remaining review of system is negative.     Medications: I have reviewed the patient's current medications.  Current Outpatient Medications  Medication Sig Dispense Refill  . acetaminophen (TYLENOL) 500 MG tablet Take 1,000 mg by mouth every 6 (six) hours as needed for moderate pain or headache.    Marland Kitchen aspirin 81 MG tablet Take 81 mg by mouth daily.    Marland Kitchen atenolol (TENORMIN) 25 MG tablet TAKE ONE TABLET BY MOUTH AT BEDTIME (Patient taking differently: TAKE 25 MG BY MOUTH AT BEDTIME) 90 tablet 1  . atorvastatin (LIPITOR) 80 MG tablet TAKE 40 MG BY MOUTH DAILY 90 tablet 0  . Cholecalciferol (VITAMIN D3) 1000 units CAPS Take 2,000 Units by mouth daily.    . Insulin Detemir (LEVEMIR FLEXTOUCH) 100 UNIT/ML Pen Inject 30 Units into the skin daily. 27.3 mL 0  . Insulin Pen Needle (B-D ULTRAFINE III SHORT PEN) 31G X 8 MM MISC CHECK BLOOD SUGAR TWICE DAILY FOR FLUCTUATIONS AND MEDICATION ADJUSTMENT 100 each 2  . loratadine (CLARITIN) 10 MG tablet Take 1 tablet daily for Allergies (Patient taking differently: Take 10 mg by mouth daily as needed for allergies. ) 360 tablet 1  . metFORMIN (GLUCOPHAGE) 1000 MG tablet TAKE ONE TABLET BY MOUTH TWICE A DAY 180 tablet 0  . mirtazapine (REMERON) 30 MG tablet Take 1/2 to 1 tablet 1 hour before bedtime  for appetite & sleep 30 tablet 2  . Tetrahydrozoline HCl (VISINE OP) Place 2 drops into both eyes daily as needed (for dry eyes).  No current facility-administered medications for this visit.      Allergies:  Allergies  Allergen Reactions  . Amaryl [Glimepiride] Diarrhea  . Codeine Itching  . Pioglitazone Diarrhea, Itching and Anxiety    Past Medical History, Surgical history, Social history, and Family History were reviewed and updated.   Physical Exam: Blood pressure 123/66, pulse 87, temperature 98.4 F (36.9 C), temperature source Oral, resp. rate 18, height 5\' 8"  (1.727 m), weight 162 lb 3.2 oz (73.6 kg), SpO2 99 %.   ECOG:  1 General appearance: Comfortable appearing gentleman without distress. Head: Normal cephalic without abnormalities. Oropharynx: No oral ulcers or thrush. Eyes: Nipples are equal and round reactive to light. Lymph nodes: No lymphadenopathy noted in the cervical, supraclavicular, or axillary nodes  Heart: Regular rate and rhythm with S1 and S2.  No leg edema. Lung: Clear without any rhonchi, wheezes or dullness to percussion. Abdomin: Soft, without any rebound or guarding.  No shifting dullness or ascites. Musculoskeletal: No clubbing or cyanosis.   Skin: No ecchymosis or petechiae.   Lab Results: Lab Results  Component Value Date   WBC 121.9 (H) 03/25/2018   HGB 8.8 (L) 03/25/2018   HCT 29.9 (L) 03/25/2018   MCV 73.8 (L) 03/25/2018   PLT 264 03/25/2018     Chemistry      Component Value Date/Time   NA 137 03/25/2018 1547   NA 139 06/26/2017 0813   K 4.7 03/25/2018 1547   K 4.8 06/26/2017 0813   CL 102 03/25/2018 1547   CO2 28 03/25/2018 1547   CO2 28 06/26/2017 0813   BUN 21 03/25/2018 1547   BUN 15.8 06/26/2017 0813   CREATININE 1.28 (H) 03/25/2018 1547   CREATININE 1.1 06/26/2017 0813      Component Value Date/Time   CALCIUM 10.3 03/25/2018 1547   CALCIUM 9.6 06/26/2017 0813   ALKPHOS 66 06/26/2017 0813   AST 16 03/25/2018 1547   AST 20 06/26/2017 0813   ALT 10 03/25/2018 1547   ALT 13 06/26/2017 0813   BILITOT 0.7 03/25/2018 1547   BILITOT 0.65 06/26/2017 0813       Impression and Plan:   72 year old man with the:  1.  CLL presented with lymphocytosis and no lymphadenopathy diagnosed in April 2017.  He has been on active surveillance since that time.  His white cell count has been increasing rather rapidly and he has been experiencing some constitutional symptoms including weight loss and fatigue.  He also developed worsening anemia.  Despite these findings he is relatively asymptomatic.  The natural course of this disease as well as the treatment  options were reviewed today.  He is a hemoglobin is currently at 8.7 with normal platelet counts and continues to have a rise in his white cell count.  The plan is to restage him with a CT scan chest abdomen and pelvis to assess for any bulky adenopathy.  I discussed with him treatment for this condition which include ibrutinib versus systemic chemotherapy versus continued observation.  After obtaining the CT scan we will have a better idea of his disease status.  I favor starting ibrutinib in the near future.  Complication associated with this medication was reviewed today which include fatigue, tiredness, cardiac arrhythmia, bleeding complications among others.  Indication to treat would be cytopenias as well as rapid rise in his white cell count with constitutional symptoms.  He is agreeable to proceed pending the results of the CT scan.  We will arrange to start the  medication in the near future after he obtain the CT scan results.  2. Iron deficiency anemia: Iron studies are repeated today and currently pending.  We will replace depending on these results.  3. Autoimmune considerations: No evidence to suggest autoimmune hemolytic anemia or thrombocytopenia.  4. Infectious considerations: No infections noted since last visit.   5. Follow-up: Will be determined depending on the results of the CT scan.  25  minutes was spent with the patient face-to-face today.  More than 50% of time was dedicated to patient counseling, education and cussing the natural course of his disease as well as treatment options.    Zola Button, MD 7/22/20198:29 AM

## 2018-04-07 NOTE — Telephone Encounter (Signed)
Printed avs and calender of upcoming appointment. Per 7/22 los also gave patient prep for ct with instructions, and CT number.

## 2018-04-18 ENCOUNTER — Ambulatory Visit (HOSPITAL_COMMUNITY)
Admission: RE | Admit: 2018-04-18 | Discharge: 2018-04-18 | Disposition: A | Discharge status: Home / Self Care | Payer: Medicare Other | Source: Ambulatory Visit | Attending: Oncology | Admitting: Oncology

## 2018-04-18 DIAGNOSIS — C9190 Lymphoid leukemia, unspecified not having achieved remission: Secondary | ICD-10-CM | POA: Insufficient documentation

## 2018-04-18 DIAGNOSIS — R590 Localized enlarged lymph nodes: Secondary | ICD-10-CM | POA: Diagnosis not present

## 2018-04-18 DIAGNOSIS — C9110 Chronic lymphocytic leukemia of B-cell type not having achieved remission: Secondary | ICD-10-CM

## 2018-04-18 MED ORDER — IOPAMIDOL (ISOVUE-300) INJECTION 61%
100.0000 mL | Freq: Once | INTRAVENOUS | Status: AC | PRN
  Administered 2018-04-18: 100 mL via INTRAVENOUS

## 2018-04-18 MED ORDER — IOPAMIDOL (ISOVUE-300) INJECTION 61%
INTRAVENOUS | Status: AC
  Filled 2018-04-18: qty 100

## 2018-04-21 ENCOUNTER — Other Ambulatory Visit: Payer: Self-pay | Admitting: Oncology

## 2018-04-21 ENCOUNTER — Other Ambulatory Visit: Payer: Self-pay | Admitting: *Deleted

## 2018-04-21 DIAGNOSIS — C9110 Chronic lymphocytic leukemia of B-cell type not having achieved remission: Secondary | ICD-10-CM

## 2018-04-21 MED ORDER — IBRUTINIB 420 MG PO TABS: 420.0000 mg | ORAL_TABLET | Freq: Every day | ORAL | 0 refills | Status: DC

## 2018-04-21 NOTE — Progress Notes (Signed)
Results of the CT scan were personally reviewed and discussed with the patient today.  Remains minimally symptomatic but his CT scan does show extensive disease that requires treatment.  Risks and benefits of Imbruvica were reviewed again over the phone after were discussed face-to-face the last visit.  He was started on 420 mg daily and we will monitor him closely on this medication.  Complications include bleeding associated with this medication were emphasized. Follow-up in the next few weeks to ensure stability and tolerance.

## 2018-04-22 ENCOUNTER — Telehealth: Payer: Self-pay | Admitting: Oncology

## 2018-04-22 NOTE — Telephone Encounter (Signed)
Called pt re appts that were added per 8/5 sch msg - left vm for pt re appts and sent confirmation letter in the mail.

## 2018-04-24 ENCOUNTER — Telehealth: Payer: Self-pay | Admitting: Pharmacist

## 2018-04-24 ENCOUNTER — Telehealth: Payer: Self-pay | Admitting: Pharmacy Technician

## 2018-04-24 DIAGNOSIS — C9110 Chronic lymphocytic leukemia of B-cell type not having achieved remission: Secondary | ICD-10-CM

## 2018-04-24 MED ORDER — IBRUTINIB 420 MG PO TABS: 420.0000 mg | ORAL_TABLET | Freq: Every day | ORAL | 1 refills | Status: DC

## 2018-04-24 NOTE — Telephone Encounter (Signed)
Oral Oncology Patient Advocate Encounter  Prior Authorization for Kate Sable has been approved.    PA# [id number] Effective dates: 04/24/18 through 09/16/18  Oral Oncology Clinic will continue to follow.   [place] Patient Nathan Davis Phone [phone] Fax [phone] 04/24/2018 11:18 AM

## 2018-04-24 NOTE — Telephone Encounter (Signed)
Oral Oncology Pharmacist [employer] authorization for Nathan Davis is approved. Test claim at the pharmacy revealed copayment $1161.75 for 1st 30-day supply  There are several grant foundations currently open for patient's disease state  Discussed risk of TLS with MD. No need for TLS prophylaxis at this time per MD.  Attempted to reach patient to provide update and offer for initial counseling on oral medication: Imbruvica.  No answer. Left VM for patient to call back for information about insurance, acquisition and initial counseling.   Johny Drilling, PharmD, BCPS, BCOP  04/24/2018 11:21 AM Oral Oncology Clinic [phone]

## 2018-04-24 NOTE — Telephone Encounter (Signed)
Oral Oncology Pharmacist Encounter  Received new prescription for Imbruvica (ibrutinib) for the treatment of chronic lymphocytic leukemia which has been on active surveillance since original diagnosis in 2017, planned duration until disease progression or unacceptable toxicity.  Labs from Epic assessed, [place] for treatment. 03/22/2018 SCr=1.28, est CrCl ~ 50 mL/min No dose adjustment recommended by manufacturer in reduced renal function  BPs reviewed, WNL, will continue to be monitored  Cardiac history included PVCs 12/31/2017 EKG shows sinus rhythm, with 1st degree AV block Patient will be closely monitored for cardiac toxicity from Imbruvica treatment  LDH remains WNL No recent assessment of phos or uric acid Patient may be at risk of TLS diue to high white count and bulky adenopathy seen on CT performed 04/18/2018 TLS monitoring and prevention will be discussed with MD  Current medication list in Epic reviewed, moderate DDI with Imbruvica and aspirin identified:  Imbruvica may enhance the adverse/toxic effect of agents with antiplatelet properties. Patient will be counseled about increased risk of bruising and bleeding associated with Imbruvica use. Aspirin indication outweighs risk. No change to current therapy indicated at this time. Patient till be closely monitored.  Prescription has been e-scribed to the [facility] for benefits analysis and approval.  Oral Oncology Clinic will continue to follow for insurance authorization, copayment issues, initial counseling and start date.  Johny Drilling, PharmD, BCPS, BCOP  04/24/2018 10:21 AM Oral Oncology Clinic [phone]

## 2018-04-24 NOTE — Telephone Encounter (Signed)
Oral Oncology Patient Advocate Encounter  Received notification from [employer] that prior authorization for Imbruvica is required.  PA submitted on CoverMyMeds Key [id number] Status is pending  Oral Oncology Clinic will continue to follow.  [place] Patient [place] Phone [phone] Fax [phone] 04/24/2018 10:59 AM

## 2018-04-25 ENCOUNTER — Telehealth: Payer: Self-pay | Admitting: Pharmacist

## 2018-04-25 NOTE — Telephone Encounter (Signed)
Oral Chemotherapy Pharmacist Encounter   I spoke with patient for overview of: Imbruvica (ibrutinib).   Counseled patient on administration, dosing, side effects, monitoring, drug-food interactions, safe handling, storage, and disposal.  Patient will take Imbruvica 420mg  tablets, 1 tablet (420mg ) by mouth once daily.  Patient will take Imbruvica at approximately the same time each day with a full glass of water and maintain adequate hydration throughout the day.  Patient knows to avoid grapefruit or grapefruit juice while on therapy with Imbruvica.  Imbruvica start date: 04/30/2018  Adverse effects include but are not limited to: bruising, decreased blood counts, N/V, diarrhea, musculoskeletal pain, arthralgias, peripheral edema, and hemorrhage.   Patient will obtain anti diarrheal and alert the office of 4 or more loose stools above baseline.  Reviewed with patient importance of keeping a medication schedule and plan for any missed doses.  Mr. Murty voiced understanding and appreciation.   All questions answered. Medication reconciliation performed and medication/allergy list updated.  Test claim at the pharmacy revealed copayment $1161.75 I have signed patient up for copayment grant from [facility] to cover out of pocket expenses for [place].  Kate Sable will ship from the [facility] on Monday (8/12) to deliver to patient's home on Tuesday. Patient will start his Imbruvica Wednesday (8/14) AM.  Patient knows to call the office with questions or concerns. Oral Oncology Clinic will continue to follow.  Thank you,  Johny Drilling, PharmD, BCPS, BCOP  04/25/2018   12:31 PM Oral Oncology Clinic [phone]

## 2018-04-25 NOTE — Telephone Encounter (Signed)
Oral Chemotherapy Pharmacist Encounter  Successfully enrolled patient for copayment assistance funds from [facility]) from the chronic lymphocytic leukemia fund Award amount: $7900 Effective dates: 01/25/18 - 04/25/19 Member ID: [id number] Group ID: [id number] RxBin ID: [id number] PCN: [id number]  Billing has been shared with the [facility].  Johny Drilling, PharmD, BCPS, BCOP 04/25/2018 12:23 PM Oral Oncology Clinic [phone]

## 2018-04-28 MED FILL — IMBRUVICA 420 MG TAB: 420 | 28 days supply | Qty: 28 | Fill #0

## 2018-04-29 NOTE — Telephone Encounter (Signed)
Oral Oncology Patient Advocate Encounter  Confirmed with [place] that [place] was shipped 04/28/18. [phone] copay with grant bringing it to $0  [place] Patient [place] Phone [phone] Fax [phone]

## 2018-05-07 ENCOUNTER — Telehealth: Payer: Self-pay | Admitting: Oncology

## 2018-05-07 NOTE — Telephone Encounter (Signed)
Left message for patient regarding upcoming sept appt updates per 8/21 sch message

## 2018-05-16 ENCOUNTER — Other Ambulatory Visit: Payer: Self-pay | Admitting: Internal Medicine

## 2018-05-21 ENCOUNTER — Inpatient Hospital Stay: Payer: Medicare Other | Attending: Oncology

## 2018-05-21 ENCOUNTER — Telehealth: Payer: Self-pay

## 2018-05-21 ENCOUNTER — Inpatient Hospital Stay (HOSPITAL_BASED_OUTPATIENT_CLINIC_OR_DEPARTMENT_OTHER): Payer: Medicare Other | Admitting: Oncology

## 2018-05-21 ENCOUNTER — Ambulatory Visit: Payer: Medicare Other | Admitting: Oncology

## 2018-05-21 ENCOUNTER — Other Ambulatory Visit: Payer: Medicare Other

## 2018-05-21 VITALS — BP 130/77 | HR 89 | Temp 97.7°F | Resp 18 | Ht 68.0 in | Wt 156.3 lb

## 2018-05-21 DIAGNOSIS — C9110 Chronic lymphocytic leukemia of B-cell type not having achieved remission: Secondary | ICD-10-CM | POA: Diagnosis not present

## 2018-05-21 DIAGNOSIS — R630 Anorexia: Secondary | ICD-10-CM | POA: Diagnosis not present

## 2018-05-21 DIAGNOSIS — D509 Iron deficiency anemia, unspecified: Secondary | ICD-10-CM | POA: Diagnosis not present

## 2018-05-21 DIAGNOSIS — Z79899 Other long term (current) drug therapy: Secondary | ICD-10-CM | POA: Diagnosis not present

## 2018-05-21 LAB — CBC WITH DIFFERENTIAL (CANCER CENTER ONLY)
Basophils Absolute: 0.8 10*3/uL — ABNORMAL HIGH (ref 0.0–0.1)
Basophils Relative: 0 %
Eosinophils Absolute: 0.5 10*3/uL (ref 0.0–0.5)
Eosinophils Relative: 0 %
HCT: 28.3 % — ABNORMAL LOW (ref 38.4–49.9)
Hemoglobin: 8.1 g/dL — ABNORMAL LOW (ref 13.0–17.1)
Lymphocytes Relative: 96 %
Lymphs Abs: 178.8 10*3/uL — ABNORMAL HIGH (ref 0.9–3.3)
MCH: 21.8 pg — ABNORMAL LOW (ref 27.2–33.4)
MCHC: 28.6 g/dL — ABNORMAL LOW (ref 32.0–36.0)
MCV: 76.1 fL — ABNORMAL LOW (ref 79.3–98.0)
Monocytes Absolute: 3.1 10*3/uL — ABNORMAL HIGH (ref 0.1–0.9)
Monocytes Relative: 2 %
Neutro Abs: 3.4 10*3/uL (ref 1.5–6.5)
Neutrophils Relative %: 2 %
Platelet Count: 245 10*3/uL (ref 140–400)
RBC: 3.72 MIL/uL — ABNORMAL LOW (ref 4.20–5.82)
RDW: 17.9 % — ABNORMAL HIGH (ref 11.0–14.6)
WBC Count: 186.7 10*3/uL (ref 4.0–10.3)

## 2018-05-21 LAB — CMP (CANCER CENTER ONLY)
ALT: 9 U/L (ref 0–44)
AST: 13 U/L — ABNORMAL LOW (ref 15–41)
Albumin: 4 g/dL (ref 3.5–5.0)
Alkaline Phosphatase: 80 U/L (ref 38–126)
Anion gap: 8 (ref 5–15)
BUN: 19 mg/dL (ref 8–23)
CO2: 27 mmol/L (ref 22–32)
Calcium: 10.4 mg/dL — ABNORMAL HIGH (ref 8.9–10.3)
Chloride: 102 mmol/L (ref 98–111)
Creatinine: 1.25 mg/dL — ABNORMAL HIGH (ref 0.61–1.24)
GFR, Est AFR Am: >60 mL/min (ref >60)
GFR, Estimated: 56 mL/min — ABNORMAL LOW (ref >60)
Glucose, Bld: 118 mg/dL — ABNORMAL HIGH (ref 70–99)
Potassium: 4.3 mmol/L (ref 3.5–5.1)
Sodium: 137 mmol/L (ref 135–145)
Total Bilirubin: 0.9 mg/dL (ref 0.3–1.2)
Total Protein: 7.3 g/dL (ref 6.5–8.1)

## 2018-05-21 MED ORDER — MEGESTROL ACETATE 400 MG/10ML PO SUSP: 400.0000 mg | Freq: Two times a day (BID) | ORAL | 0 refills | Status: DC

## 2018-05-21 MED FILL — IMBRUVICA 420 MG TAB: 420 | 28 days supply | Qty: 28 | Fill #1

## 2018-05-21 NOTE — Addendum Note (Signed)
Addended by: Wyatt Portela on: 05/21/2018 11:36 AM   Modules accepted: Orders

## 2018-05-21 NOTE — Progress Notes (Signed)
Hematology and Oncology Follow Up Visit  Nathan Davis [id number] July 05, 1946 72 y.o. 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    Current therapy: Imbruvica 420 mg daily started in August 2018.   Interim History:  [name] is here for a follow-up visit.  Since the last visit, he reports no major changes or complaints.  He tolerated Imbruvica without any recent issues.  He did have one episode of diarrhea that has resolved.  He denies any easy bruising or bleeding complications.  He denies any pruritus or skin rash.  He denies any palpitation or dyspnea on exertion.  He denies any excessive fatigue or tiredness.  He continues to work part-time and able to attend activities of daily living.  He denies any falls or syncope.  He does not report any headaches, blurry vision, or seizures.he denies any dizziness or excessive lethargy.  He denies any fevers or chills or sweats.  Does not report any chest pain, palpitation, orthopnea or leg edema. He does not report any  hemoptysis. Does not report any nausea, vomiting or abdominal pain. Does not report any change in his bowel habits. He does not report any frequency urgency or hematuria.  He denies any anxiety or depression.  He denies any lymphadenopathy or petechiae.  He denies any skin rashes or lesions.  He denies any mood changes.  Remaining review of system is negative.     Medications: I have reviewed the patient's current medications.  Current Outpatient Medications  Medication Sig Dispense Refill  . acetaminophen (TYLENOL) 500 MG tablet Take 1,000 mg by mouth every 6 (six) hours as needed for moderate pain or headache.    Marland Kitchen aspirin 81 MG tablet Take 81 mg by mouth daily.    Marland Kitchen atenolol (TENORMIN) 25 MG tablet TAKE ONE TABLET BY MOUTH AT BEDTIME  (Patient taking differently: TAKE 25 MG BY MOUTH AT BEDTIME) 90 tablet 1  . atorvastatin (LIPITOR) 80 MG tablet TAKE 40 MG BY MOUTH DAILY 90 tablet 0  . Cholecalciferol (VITAMIN D3) 1000 units CAPS Take 2,000 Units by mouth daily.    . Ibrutinib (IMBRUVICA) 420 MG TABS Take 420 mg by mouth daily. Take with a glass of water at approx the same time each day. 30 tablet 1  . Insulin Pen Needle (B-D ULTRAFINE III SHORT PEN) 31G X 8 MM MISC CHECK BLOOD SUGAR TWICE DAILY FOR FLUCTUATIONS AND MEDICATION ADJUSTMENT 100 each 2  . LEVEMIR FLEXTOUCH 100 UNIT/ML Pen INJECT 30 UNITS INTO THE SKIN DAILY 15 pen 3  . loratadine (CLARITIN) 10 MG tablet Take 1 tablet daily for Allergies (Patient taking differently: Take 10 mg by mouth daily as needed for allergies. ) 360 tablet 1  . metFORMIN (GLUCOPHAGE) 1000 MG tablet TAKE ONE TABLET BY MOUTH TWICE A DAY 180 tablet 0  . mirtazapine (REMERON) 30 MG tablet Take 1/2 to 1 tablet 1 hour before bedtime  for appetite & sleep 30 tablet 2  . Tetrahydrozoline HCl (VISINE OP) Place 2 drops into both eyes daily as needed (for dry eyes).     No current facility-administered medications for this visit.      Allergies:  Allergies  Allergen Reactions  . Amaryl [Glimepiride] Diarrhea  . Codeine Itching  . Pioglitazone Diarrhea, Itching and Anxiety    Past Medical History, Surgical history, Social history, and Family History  were reviewed and updated.   Physical Exam: Blood pressure 130/77, pulse 89, temperature 97.7 F (36.5 C), temperature source Oral, resp. rate 18, height 5\' 8"  (1.727 m), weight 156 lb 4.8 oz (70.9 kg), SpO2 99 %.    ECOG: 1   General appearance:  Alert, awake gentleman without distress. Head: Normocephalic without any trauma Oropharynx: Mucous membranes are moist and pink without any thrush or ulcers. Eyes: Pupils are equal and round reactive to light. Lymph nodes: No cervical, supraclavicular, inguinal or axillary lymphadenopathy.    Heart:regular rate and rhythm.  S1 and S2 without leg edema. Lung: Clear without any rhonchi or wheezes.  No dullness to percussion. Abdomin: Soft, nontender, nondistended with good bowel sounds.  No hepatosplenomegaly. Musculoskeletal: No joint deformity or effusion.  Full range of motion noted. Neurological: No deficits noted on motor, sensory and deep tendon reflex exam. Skin: No petechial rash or dryness.  Appeared moist.  Psychiatric: Mood and affect appeared appropriate.    Lab Results: Lab Results  Component Value Date   WBC 121.3 (HH) 04/07/2018   HGB 8.7 (L) 04/07/2018   HCT 28.9 (L) 04/07/2018   MCV 73.2 (L) 04/07/2018   PLT 191 04/07/2018     Chemistry      Component Value Date/Time   NA 137 03/25/2018 1547   NA 139 06/26/2017 0813   K 4.7 03/25/2018 1547   K 4.8 06/26/2017 0813   CL 102 03/25/2018 1547   CO2 28 03/25/2018 1547   CO2 28 06/26/2017 0813   BUN 21 03/25/2018 1547   BUN 15.8 06/26/2017 0813   CREATININE 1.28 (H) 03/25/2018 1547   CREATININE 1.1 06/26/2017 0813      Component Value Date/Time   CALCIUM 10.3 03/25/2018 1547   CALCIUM 9.6 06/26/2017 0813   ALKPHOS 66 06/26/2017 0813   AST 16 03/25/2018 1547   AST 20 06/26/2017 0813   ALT 10 03/25/2018 1547   ALT 13 06/26/2017 0813   BILITOT 0.7 03/25/2018 1547   BILITOT 0.65 06/26/2017 0813     EXAM: CT CHEST, ABDOMEN, AND PELVIS WITH CONTRAST  TECHNIQUE: Multidetector CT imaging of the chest, abdomen and pelvis was performed following the standard protocol during bolus administration of intravenous contrast.  CONTRAST:  176mL ISOVUE-300 IOPAMIDOL (ISOVUE-300) INJECTION 61%  COMPARISON:  None.  FINDINGS: CT CHEST FINDINGS  Cardiovascular: Normal heart size. Coronary arterial vascular calcifications.  Mediastinum/Nodes: Enlarged axillary mediastinal adenopathy. Reference 1.0 cm right axillary lymph node (image 18; series 2). Reference 1.0 cm left axillary lymph node (image  18; series 2). Reference 3.6 x 1.8 cm right paratracheal nodal soft tissue (image 21; series 2). Reference 2.4 cm subcarinal node (image 28; series 2).  Lungs/Pleura: Central airways are patent. Dependent atelectasis within the bilateral lower lobes. Calcified granuloma right upper lobe. No pleural effusion or pneumothorax.  Musculoskeletal: 10 mm lytic lesion within the left sixth rib (image 72; series 6). Additionally, there is suggestion of multiple tiny lytic lesions throughout the majority of the ribs and spine.  CT ABDOMEN PELVIS FINDINGS  Hepatobiliary: The liver is normal in size and contour. No focal hepatic lesion identified. Gallbladder is unremarkable.  Pancreas: Unremarkable  Spleen: Heterogeneity, particularly of the superior aspect of the spleen (image 40; series 2).  Adrenals/Urinary Tract: Normal adrenal glands. Kidneys enhance symmetrically with contrast. No hydronephrosis. Urinary bladder wall thickening.  Stomach/Bowel: Normal morphology of the stomach. No abnormal bowel wall thickening or evidence for bowel obstruction.  Vascular/Lymphatic: Tortuous yet normal caliber abdominal aorta.  Peripheral calcified atherosclerotic plaque. Abnormal soft tissue is demonstrated within the central upper abdomen and along the course of the abdominal aorta. Within the upper abdomen there is a 5.9 x 2.7 cm porta hepatic node (image 56; series 2). Additionally, there is confluent soft tissue surrounding the aorta and adjacent IVC (image 60; series 2). Abnormal soft tissue within the anterior lower abdomen within the small bowel mesentery measuring up to 3.3 x 3.7 cm (image 94; series 2). There is a 2.3 cm right pelvic sidewall lymph node (image 94; series 2). 1.0 cm left inguinal lymph node (image 106; series 2).  Reproductive: Prostate is unremarkable.  Other: Bilateral fat containing inguinal hernias. Small amount of fluid and fat stranding within the left  inguinal hernia.  Musculoskeletal: Extensive patchy lucencies demonstrated throughout the lumbar spine and pelvis. Wedge compression deformities of the T11, T12, L1-L5 vertebral bodies. Overall these are similar when compared to CT abdomen pelvis 12/13/2017.  IMPRESSION: 1. Bulky enlarged adenopathy within the mediastinum, porta hepatis, pelvis and retroperitoneum with additional enlarged bilateral axillary lymph nodes, compatible with history of lymphoma. Additionally there is abnormal soft tissue within the anterior lower abdomen, arising from the mesentery, suggestive of lymphomatous involvement. 2. 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 3. Heterogeneity of the spleen, likely secondary to early phase of contrast enhancement. Recommend attention on follow-up as lymphomatous involvement not excluded. 4. Multilevel wedge compression deformities involving the T11-L5 vertebral bodies, similar when compared to CT abdomen pelvis 12/13/2017. Recommend correlation for point   Impression and Plan:   72 year old man with the:  1.  CLL diagnosed in 2017 after presenting with lymphocytosis and lymphadenopathy.  He had active surveillance for a period of time and started [place] in August 2019.  The indication for treatment was worsening lymphadenopathy and cytopenias.Marland Kitchen  Here is currently on Imbruvica without complications.  His laboratory data were reviewed today and his white cell count still elevated.  The natural course of this disease was reviewed again alternative treatment strategies were discussed.  These options would include systemic chemotherapy with or without rituximab.  The plan is to give this medication adequate time and monitor his counts closely.  We will repeat his CBC in a month and anticipate improvement in his white cell count at the time.   2. Iron deficiency anemia: Iron studies  in July 2019 showed adequate iron stores without any need for any replacements.  No need for any transfusion at this time.  3. Autoimmune considerations: No element of autoimmune hemolytic anemia.   4. Infectious considerations: No recent infection noted.  We will continue to educate him about potential complications associated with this treatment.  5.  Anorexia: Prescription for Megace will be given to him with instructions how to use it.  He is currently on Remeron although has not been effective.  6. Follow-up: Will be 4 weeks to follow his progress.  25  minutes was spent with the patient face-to-face today.  More than 50% of time was dedicated to discussing the natural course of this disease, complication associated with it and treatment approach.    Zola Button, MD 9/4/201910:55 AM

## 2018-05-21 NOTE — Telephone Encounter (Signed)
Printed avs and calender of upcoming appointment. Per 9/4 los Patient requested Wednesday's appointment due to employment

## 2018-05-28 ENCOUNTER — Other Ambulatory Visit: Payer: Self-pay | Admitting: *Deleted

## 2018-05-28 DIAGNOSIS — E109 Type 1 diabetes mellitus without complications: Secondary | ICD-10-CM

## 2018-05-28 MED ORDER — INSULIN PEN NEEDLE 31G X 8 MM MISC: 2 refills | Status: AC

## 2018-05-29 ENCOUNTER — Encounter: Payer: Self-pay | Admitting: Internal Medicine

## 2018-06-11 ENCOUNTER — Other Ambulatory Visit: Payer: Self-pay | Admitting: Oncology

## 2018-06-11 DIAGNOSIS — C9110 Chronic lymphocytic leukemia of B-cell type not having achieved remission: Secondary | ICD-10-CM

## 2018-06-23 MED FILL — IMBRUVICA 420 MG TAB: 420 | 28 days supply | Qty: 28 | Fill #0

## 2018-06-25 ENCOUNTER — Inpatient Hospital Stay (HOSPITAL_BASED_OUTPATIENT_CLINIC_OR_DEPARTMENT_OTHER): Payer: Medicare Other | Admitting: Oncology

## 2018-06-25 ENCOUNTER — Telehealth: Payer: Self-pay | Admitting: Oncology

## 2018-06-25 ENCOUNTER — Inpatient Hospital Stay: Payer: Medicare Other | Attending: Oncology

## 2018-06-25 VITALS — BP 144/56 | HR 62 | Temp 97.6°F | Resp 17 | Ht 68.0 in | Wt 163.8 lb

## 2018-06-25 DIAGNOSIS — Z79899 Other long term (current) drug therapy: Secondary | ICD-10-CM | POA: Diagnosis not present

## 2018-06-25 DIAGNOSIS — C9110 Chronic lymphocytic leukemia of B-cell type not having achieved remission: Secondary | ICD-10-CM

## 2018-06-25 DIAGNOSIS — D509 Iron deficiency anemia, unspecified: Secondary | ICD-10-CM | POA: Diagnosis not present

## 2018-06-25 DIAGNOSIS — D649 Anemia, unspecified: Secondary | ICD-10-CM | POA: Insufficient documentation

## 2018-06-25 LAB — CBC WITH DIFFERENTIAL (CANCER CENTER ONLY)
Abs Immature Granulocytes: 0.45 10*3/uL — ABNORMAL HIGH (ref 0.00–0.07)
Basophils Absolute: 0.2 10*3/uL — ABNORMAL HIGH (ref 0.0–0.1)
Basophils Relative: 0 %
Eosinophils Absolute: 0.9 10*3/uL — ABNORMAL HIGH (ref 0.0–0.5)
Eosinophils Relative: 1 %
HCT: 27.5 % — ABNORMAL LOW (ref 39.0–52.0)
Hemoglobin: 7.8 g/dL — ABNORMAL LOW (ref 13.0–17.0)
Immature Granulocytes: 0 %
Lymphocytes Relative: 94 %
Lymphs Abs: 157.7 10*3/uL — ABNORMAL HIGH (ref 0.7–4.0)
MCH: 21.8 pg — ABNORMAL LOW (ref 26.0–34.0)
MCHC: 28.4 g/dL — ABNORMAL LOW (ref 30.0–36.0)
MCV: 76.8 fL — ABNORMAL LOW (ref 80.0–100.0)
Monocytes Absolute: 1.4 10*3/uL — ABNORMAL HIGH (ref 0.1–1.0)
Monocytes Relative: 1 %
Neutro Abs: 5.9 10*3/uL (ref 1.7–7.7)
Neutrophils Relative %: 4 %
Platelet Count: 290 10*3/uL (ref 150–400)
RBC: 3.58 MIL/uL — ABNORMAL LOW (ref 4.22–5.81)
RDW: 18.6 % — ABNORMAL HIGH (ref 11.5–15.5)
WBC Count: 166.5 10*3/uL (ref 4.0–10.5)
nRBC: 0 % (ref 0.0–0.2)

## 2018-06-25 LAB — CMP (CANCER CENTER ONLY)
ALT: 11 U/L (ref 0–44)
AST: 13 U/L — ABNORMAL LOW (ref 15–41)
Albumin: 3.8 g/dL (ref 3.5–5.0)
Alkaline Phosphatase: 82 U/L (ref 38–126)
Anion gap: 8 (ref 5–15)
BUN: 17 mg/dL (ref 8–23)
CO2: 27 mmol/L (ref 22–32)
Calcium: 10 mg/dL (ref 8.9–10.3)
Chloride: 104 mmol/L (ref 98–111)
Creatinine: 1.26 mg/dL — ABNORMAL HIGH (ref 0.61–1.24)
GFR, Est AFR Am: >60 mL/min (ref >60)
GFR, Estimated: 55 mL/min — ABNORMAL LOW (ref >60)
Glucose, Bld: 102 mg/dL — ABNORMAL HIGH (ref 70–99)
Potassium: 5 mmol/L (ref 3.5–5.1)
Sodium: 139 mmol/L (ref 135–145)
Total Bilirubin: 0.6 mg/dL (ref 0.3–1.2)
Total Protein: 7.1 g/dL (ref 6.5–8.1)

## 2018-06-25 NOTE — Telephone Encounter (Signed)
Appts scheduled avs/calendar printed per 10/9 los °

## 2018-06-25 NOTE — Progress Notes (Signed)
Hematology and Oncology Follow Up Visit  Nathan Davis [id number] May 25, 1946 72 y.o. 06/25/2018 8:53 AM Nathan Davis, MDMcKeown, Nathan Saxon, MD   Principle Diagnosis: 72 year old man with CLL presented with lymphadenopathy and leukocytosis in 2017.   Current therapy: Imbruvica 420 mg daily started in August 2018.   Interim History:  Nathan Davis is here for a return visit.  Since her last visit, he reports no major changes or complaints.  He continues to take Imbruvica without any complications.  He denies any nausea, fatigue or easy bleeding or bruising.  He has improved his appetite and his oral intake and has gained more weight.  He remains active and continues to attend to activities of daily living.  Continues to work part-time as well.  He does not report any headaches, blurry vision, or seizures.he denies any alteration mental status or confusion.  He denies any fevers or chills or sweats.  Does not report any chest pain, palpitation, orthopnea or leg edema. He does not report any hemoptysis. Does not report any nausea, vomiting or abdominal pain. Does not report any constipation or diarrhea.  He does not report any frequency urgency or hematuria.  He denies any lymphadenopathy or petechiae.  He denies any skin rashes or lesions.  He denies any bleeding or clotting tendency.  Remaining review of system is negative.     Medications: I have reviewed the patient's current medications.  Current Outpatient Medications  Medication Sig Dispense Refill  . acetaminophen (TYLENOL) 500 MG tablet Take 1,000 mg by mouth every 6 (six) hours as needed for moderate pain or headache.    Marland Kitchen aspirin 81 MG tablet Take 81 mg by mouth daily.    Marland Kitchen atenolol (TENORMIN) 25 MG tablet TAKE ONE TABLET BY MOUTH AT BEDTIME (Patient taking differently: TAKE 25 MG BY MOUTH AT BEDTIME) 90 tablet 1  . atorvastatin (LIPITOR) 80 MG tablet TAKE 40 MG BY MOUTH DAILY 90 tablet 0  . Cholecalciferol (VITAMIN D3) 1000  units CAPS Take 2,000 Units by mouth daily.    . IMBRUVICA 420 MG TABS TAKE 1 TABLET (420 MG) BY MOUTH DAILY. TAKE WITH A GLASS OF WATER AT APPROX THE SAME TIME EACH DAY. 28 tablet 1  . Insulin Pen Needle (B-D ULTRAFINE III SHORT PEN) 31G X 8 MM MISC USE DAILY WITH LEVEMIR. 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 100 each 2  . LEVEMIR FLEXTOUCH 100 UNIT/ML Pen INJECT 30 UNITS INTO THE SKIN DAILY 15 pen 3  . loratadine (CLARITIN) 10 MG tablet Take 1 tablet daily for Allergies (Patient taking differently: Take 10 mg by mouth daily as needed for allergies. ) 360 tablet 1  . megestrol (MEGACE) 400 MG/10ML suspension Take 10 mLs (400 mg total) by mouth 2 (two) times daily. 240 mL 0  . metFORMIN (GLUCOPHAGE) 1000 MG tablet TAKE ONE TABLET BY MOUTH TWICE A DAY 180 tablet 0  . mirtazapine (REMERON) 30 MG tablet Take 1/2 to 1 tablet 1 hour before bedtime  for appetite & sleep 30 tablet 2  . Tetrahydrozoline HCl (VISINE OP) Place 2 drops into both eyes daily as needed (for dry eyes).     No current facility-administered medications for this visit.      Allergies:  Allergies  Allergen Reactions  . Amaryl [Glimepiride] Diarrhea  . Codeine Itching  . Pioglitazone Diarrhea, Itching and Anxiety    Past Medical History, Surgical history, Social history, and Family History were reviewed and updated.   Physical Exam:  Blood pressure (!) 144/56, pulse 62, temperature 97.6  F (36.4 C), temperature source Oral, resp. rate 17, height 5\' 8"  (1.727 m), weight 163 lb 12.8 oz (74.3 kg), SpO2 98 %.    ECOG: 1   General appearance: Comfortable appearing without any discomfort Head: Normocephalic without any trauma Oropharynx: Mucous membranes are moist and pink without any thrush or ulcers. Eyes: Pupils are equal and round reactive to light. Lymph nodes: No cervical, supraclavicular, inguinal or axillary lymphadenopathy.   Heart:regular rate and rhythm.  S1 and S2 without leg edema. Lung: Clear without any rhonchi or wheezes.  No  dullness to percussion. Abdomin: Soft, nontender, nondistended with good bowel sounds.  No hepatosplenomegaly. Musculoskeletal: No joint deformity or effusion.  Full range of motion noted. Neurological: No deficits noted on motor, sensory and deep tendon reflex exam. Skin: No petechial rash or dryness.  Appeared moist.      Lab Results: Lab Results  Component Value Date   WBC 186.7 (HH) 05/21/2018   HGB 8.1 (L) 05/21/2018   HCT 28.3 (L) 05/21/2018   MCV 76.1 (L) 05/21/2018   PLT 245 05/21/2018     Chemistry      Component Value Date/Time   NA 137 05/21/2018 1049   NA 139 06/26/2017 0813   K 4.3 05/21/2018 1049   K 4.8 06/26/2017 0813   CL 102 05/21/2018 1049   CO2 27 05/21/2018 1049   CO2 28 06/26/2017 0813   BUN 19 05/21/2018 1049   BUN 15.8 06/26/2017 0813   CREATININE 1.25 (H) 05/21/2018 1049   CREATININE 1.28 (H) 03/25/2018 1547   CREATININE 1.1 06/26/2017 0813      Component Value Date/Time   CALCIUM 10.4 (H) 05/21/2018 1049   CALCIUM 9.6 06/26/2017 0813   ALKPHOS 80 05/21/2018 1049   ALKPHOS 66 06/26/2017 0813   AST 13 (L) 05/21/2018 1049   AST 20 06/26/2017 0813   ALT 9 05/21/2018 1049   ALT 13 06/26/2017 0813   BILITOT 0.9 05/21/2018 1049   BILITOT 0.65 06/26/2017 0813        Impression and Plan:   72 year old man with the:  1.  CLL presented with lymphocytosis and adenopathy diagnosed in 2017.   He remains on ibrutinib without any major complications.  Risks and benefits of continuing this medication was reviewed today long-term issues were reported.  He has tolerated this medication without any issues and the plan is to continue with the same dose and schedule and continue to monitor his white cell count as well as tumor lysis markers.  So far he has not experienced any issues.  We will continue with the same dose and schedule and monitor him closely.  2.  Mild anemia: Appears to be multifactorial in nature related to CLL as well as iron  deficiency.  No need for transfusion at this time.  3. Autoimmune considerations: No evidence of autoimmune hemolytic anemia at this time.  His platelet count remains normal.  4. Infectious considerations: No recent infections or hospitalizations.  Continue to educate him about this possibility.  5.  Anorexia: Improved at this time with increased weight since last visit.  6. Follow-up: Will be 5 weeks follow his progress.  15  minutes was spent with the patient face-to-face today.  More than 50% of time was dedicated to reviewing the natural course of his disease and coordinating his plan of care.    Zola Button, MD 10/9/20198:53 AM

## 2018-06-26 ENCOUNTER — Ambulatory Visit: Payer: Medicare Other | Admitting: Internal Medicine

## 2018-06-26 ENCOUNTER — Encounter: Payer: Self-pay | Admitting: Internal Medicine

## 2018-06-26 VITALS — BP 110/62 | HR 64 | Temp 97.3°F | Resp 16 | Ht 65.0 in | Wt 160.8 lb

## 2018-06-26 DIAGNOSIS — N183 Chronic kidney disease, stage 3 unspecified: Secondary | ICD-10-CM

## 2018-06-26 DIAGNOSIS — I10 Essential (primary) hypertension: Secondary | ICD-10-CM | POA: Diagnosis not present

## 2018-06-26 DIAGNOSIS — E559 Vitamin D deficiency, unspecified: Secondary | ICD-10-CM

## 2018-06-26 DIAGNOSIS — E119 Type 2 diabetes mellitus without complications: Secondary | ICD-10-CM

## 2018-06-26 DIAGNOSIS — Z0001 Encounter for general adult medical examination with abnormal findings: Secondary | ICD-10-CM

## 2018-06-26 DIAGNOSIS — Z79899 Other long term (current) drug therapy: Secondary | ICD-10-CM

## 2018-06-26 DIAGNOSIS — Z1211 Encounter for screening for malignant neoplasm of colon: Secondary | ICD-10-CM

## 2018-06-26 DIAGNOSIS — N401 Enlarged prostate with lower urinary tract symptoms: Secondary | ICD-10-CM

## 2018-06-26 DIAGNOSIS — Z0000 Encounter for general adult medical examination without abnormal findings: Secondary | ICD-10-CM

## 2018-06-26 DIAGNOSIS — Z794 Long term (current) use of insulin: Secondary | ICD-10-CM

## 2018-06-26 DIAGNOSIS — Z8249 Family history of ischemic heart disease and other diseases of the circulatory system: Secondary | ICD-10-CM

## 2018-06-26 DIAGNOSIS — Z136 Encounter for screening for cardiovascular disorders: Secondary | ICD-10-CM

## 2018-06-26 DIAGNOSIS — Z1212 Encounter for screening for malignant neoplasm of rectum: Secondary | ICD-10-CM

## 2018-06-26 DIAGNOSIS — E782 Mixed hyperlipidemia: Secondary | ICD-10-CM

## 2018-06-26 DIAGNOSIS — Z125 Encounter for screening for malignant neoplasm of prostate: Secondary | ICD-10-CM

## 2018-06-26 DIAGNOSIS — N138 Other obstructive and reflux uropathy: Secondary | ICD-10-CM | POA: Insufficient documentation

## 2018-06-26 DIAGNOSIS — E1122 Type 2 diabetes mellitus with diabetic chronic kidney disease: Secondary | ICD-10-CM

## 2018-06-26 DIAGNOSIS — C9110 Chronic lymphocytic leukemia of B-cell type not having achieved remission: Secondary | ICD-10-CM

## 2018-06-26 NOTE — Patient Instructions (Addendum)

## 2018-06-26 NOTE — Progress Notes (Signed)
[facility]   [name], M.D.     Uvaldo Bristle. Silverio Lay, P.A.-C Liane Comber, [place]                [address] [place], [place] [address] Telephone [phone] Telefax [phone] Annual  Screening/Preventative Visit  & Comprehensive Evaluation & Examination     This very nice 72 y.o.  MBM presents for a Screening /Preventative Visit & comprehensive evaluation and management of multiple medical co-morbidities.  Patient has been followed for HTN, HLD, T2_IDDM and Vitamin D Deficiency. Patient was dx'd  CLL (Apr 2017) and initiated on Treatment in Aug 2019 by Dr Alen Blew     HTN predates since 2011. Patient's BP has been controlled at home.  Today's BP is at goal - 110/62. In 2011, patient had a negative Stress Test by Dr Aundra Dubin. Patient denies any cardiac symptoms as chest pain, palpitations, shortness of breath, dizziness or ankle swelling.     Patient's hyperlipidemia is controlled with diet and medications. Patient denies myalgias or other medication SE's. 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 Patient has lost 43# over the last year with his CLL becoming more aggressive and this weight loss has helped his Diabetes.  Last A1c was at goal: Lab Results  Component Value Date   HGBA1C 5.5 06/26/2018       Finally, patient has history of Vitamin D Deficiency ("34"/2015) and last vitamin D was at goal: Lab Results  Component Value Date   VD25OH 59 06/26/2018   Current Outpatient Medications on File Prior to Visit  Medication Sig  . acetaminophen (TYLENOL) 500 MG tablet Take 1,000 mg by mouth every 6 (six) hours as  needed for moderate pain or headache.  Marland Kitchen aspirin 81 MG tablet Take 81 mg by mouth daily.  Marland Kitchen atenolol (TENORMIN) 25 MG tablet TAKE ONE TABLET BY MOUTH AT BEDTIME (Patient taking differently: TAKE 25 MG BY MOUTH AT BEDTIME)  . atorvastatin (LIPITOR) 80 MG tablet TAKE 40 MG BY MOUTH DAILY  . Cholecalciferol (VITAMIN D3) 1000 units CAPS Take 2,000 Units by mouth daily.  . IMBRUVICA 420 MG TABS TAKE 1 TABLET (420 MG) BY MOUTH DAILY. TAKE WITH A GLASS OF WATER AT APPROX THE SAME TIME EACH DAY.  Marland Kitchen Insulin Pen Needle (B-D ULTRAFINE III SHORT PEN) 31G X 8 MM MISC USE DAILY WITH LEVEMIR. DX-E11.29.  Marland Kitchen LEVEMIR FLEXTOUCH 100 UNIT/ML Pen INJECT 30 UNITS INTO THE SKIN DAILY  . loratadine (CLARITIN) 10 MG tablet Take 1 tablet daily for Allergies (Patient taking differently: Take 10 mg by mouth daily as needed for allergies. )  . metFORMIN (GLUCOPHAGE) 1000 MG tablet TAKE ONE TABLET BY MOUTH TWICE A DAY  . mirtazapine (REMERON) 30 MG tablet Take 1/2 to 1 tablet 1 hour before bedtime  for appetite & sleep  . Tetrahydrozoline HCl (VISINE OP) Place 2 drops into both eyes daily as needed (for dry eyes).   No current facility-administered medications on file prior to visit.    Allergies  Allergen Reactions  . Amaryl [Glimepiride] Diarrhea  .  Codeine Itching  . Pioglitazone Diarrhea, Itching and Anxiety   Past Medical History:  Diagnosis Date  . Cataract   . CLL (chronic lymphocytic leukemia) ([place])    not treated yet  . Colon polyps   . Diabetes ([place]) 1992   type 2  . ED (erectile dysfunction)   . Femur fracture (HCC)    left  . Heart murmur   . History of hiatal hernia   . Iron (Fe) deficiency anemia   . Obesity   . Other and unspecified hyperlipidemia   . Premature beats, unspecified   . Unspecified essential hypertension   . Vitamin D deficiency    Health Maintenance  Topic Date Due  . INFLUENZA VACCINE  04/17/2018  . HEMOGLOBIN A1C  12/26/2018  . OPHTHALMOLOGY EXAM  01/31/2019  . FOOT  EXAM  06/27/2019  . URINE MICROALBUMIN  06/27/2019  . COLONOSCOPY  06/28/2020  . TETANUS/TDAP  08/28/2023  . Hepatitis C Screening  Completed  . PNA vac Low Risk Adult  Completed   Immunization History  Administered Date(s) Administered  . Influenza, High Dose Seasonal PF 07/22/2013  . Influenza-Unspecified 06/25/2012, 06/10/2017  . PPD Test 08/27/2013  . Pneumococcal Conjugate-13 08/22/2011  . Pneumococcal Polysaccharide-23 05/30/2016  . Td 09/17/2002  . Tdap 08/27/2013  . Zoster 09/17/2005   Last Colon - 03/02/2010 - Dr Deatra Ina recc 10 yr f/u due June 2021.   Past Surgical History:  Procedure Laterality Date  . ETT - MYOVIEW     7'45", stopped due to fatigue, no chest pain. EF55%, no ischemia or infarction  . HERNIA REPAIR     hiatal hernia  1994 , inguinal scheduled 01-02-18 with dr. Coralie Keens  . INGUINAL HERNIA REPAIR Bilateral 02/06/2018   Procedure: LAPAROSCOPIC BILATERAL INGUINAL HERNIA REPAIR;  Surgeon: Coralie Keens, MD;  Location: [facility];  Service: General;  Laterality: Bilateral;  . INSERTION OF MESH Bilateral 02/06/2018   Procedure: INSERTION OF MESH;  Surgeon: Coralie Keens, MD;  Location: [facility];  Service: General;  Laterality: Bilateral;  . nissan fundoplication N/A 21/19/4174   Family History  Problem Relation Age of Onset  . Throat cancer Mother   . Alzheimer's disease Father   . Hypertension Unknown        family history  . Heart attack Unknown 79    ROS Constitutional: Denies fever, chills, weight loss/gain, headaches, insomnia,  night sweats or change in appetite. Does c/o fatigue. Eyes: Denies redness, blurred vision, diplopia, discharge, itchy or watery eyes.  ENT: Denies discharge, congestion, post nasal drip, epistaxis, sore throat, earache, hearing loss, dental pain, Tinnitus, Vertigo, Sinus pain or snoring.  Cardio: Denies chest pain, palpitations, irregular heartbeat, syncope, dyspnea, diaphoresis,  orthopnea, PND, claudication or edema Respiratory: denies cough, dyspnea, DOE, pleurisy, hoarseness, laryngitis or wheezing.  Gastrointestinal: Denies dysphagia, heartburn, reflux, water brash, pain, cramps, nausea, vomiting, bloating, diarrhea, constipation, hematemesis, melena, hematochezia, jaundice or hemorrhoids Genitourinary: Denies dysuria, frequency, discharge, hematuria or flank pain. Has urgency, nocturia x 2-3 & occasional hesitancy. Musculoskeletal: Denies arthralgia, myalgia, stiffness, Jt. Swelling, pain, limp or strain/sprain. Denies Falls. Skin: Denies puritis, rash, hives, warts, acne, eczema or change in skin lesion Neuro: No weakness, tremor, incoordination, spasms, paresthesia or pain Psychiatric: Denies confusion, memory loss or sensory loss. Denies Depression. Endocrine: Denies change in weight, skin, hair change, nocturia, and paresthesia, diabetic polys, visual blurring or hyper / hypo glycemic episodes.  Heme/Lymph: No excessive bleeding, bruising or enlarged lymph nodes.  Physical Exam  BP 110/62   Pulse 64   Temp (!) 97.3 F (36.3 C)   Resp 16   Ht 5\' 5"  (1.651 m)   Wt 160 lb 12.8 oz (72.9 kg)   BMI 26.76 kg/m   General Appearance: Well nourished and well groomed and in no apparent distress.  Eyes: PERRLA, EOMs, conjunctiva no swelling or erythema, normal fundi and vessels. Sinuses: No frontal/maxillary tenderness ENT/Mouth: EACs patent / TMs  nl. Nares clear without erythema, swelling, mucoid exudates. Oral hygiene is good. No erythema, swelling, or exudate. Tongue normal, non-obstructing. Tonsils not swollen or erythematous. Hearing normal.  Neck: Supple, thyroid not palpable. No bruits, nodes or JVD. Respiratory: Respiratory effort normal.  BS equal and clear bilateral without rales, rhonci, wheezing or stridor. Cardio: Heart sounds are normal with regular rate and rhythm and no murmurs, rubs or gallops. Peripheral pulses are normal and equal bilaterally  without edema. No aortic or femoral bruits. Chest: symmetric with normal excursions and percussion.  Abdomen: Soft, with Nl bowel sounds. Nontender, no guarding, rebound, hernias, masses, or organomegaly.  Lymphatics: Non tender without lymphadenopathy.  Genitourinary: No hernias.Testes nl. DRE - prostate nl for age - smooth & firm w/o nodules. Musculoskeletal: Full ROM all peripheral extremities, joint stability, 5/5 strength, and normal gait. Skin: Warm and dry without rashes, lesions, cyanosis, clubbing or  ecchymosis.  Neuro: Cranial nerves intact, reflexes equal bilaterally. Normal muscle tone, no cerebellar symptoms. Sensation intact to touch, vibratory and Monofilament to the toes bilaterally. Pysch: Alert and oriented X 3 with normal affect, insight and judgment appropriate.   Assessment and Plan  1. Annual Preventative/Screening Exam   2. Essential hypertension  - EKG 12-Lead - [place], RETROPERITNL ABD,  LTD - Urinalysis, Routine w reflex microscopic - Microalbumin / creatinine urine ratio - Magnesium - TSH  3. Hyperlipidemia, mixed  - EKG 12-Lead - [place], RETROPERITNL ABD,  LTD - Lipid panel - TSH  4. Type 2 diabetes mellitus with stage 3 chronic kidney disease, with long-term current use of insulin (HCC)  - EKG 12-Lead - [place], RETROPERITNL ABD,  LTD - Urinalysis, Routine w reflex microscopic - Microalbumin / creatinine urine ratio - HM DIABETES FOOT EXAM - LOW EXTREMITY NEUR EXAM DOCUM - Hemoglobin A1c  5. Vitamin D deficiency  - VITAMIN D 25 Hydroxyl  6. CLL (chronic lymphocytic leukemia) ([place])   7. CKD stage 3 due to type 2 diabetes mellitus (HCC)  - Urinalysis, Routine w reflex microscopic - Microalbumin / creatinine urine ratio  8. Screening for colorectal cancer  - POC Hemoccult Bld/Stl   9. BPH with obstruction/lower urinary tract symptoms  - PSA  10. Prostate cancer screening  - PSA  11. Screening for ischemic heart disease  - EKG  12-Lead  12. FHx: heart disease  - EKG 12-Lead - [place], RETROPERITNL ABD,  LTD  13. Screening for AAA (aortic abdominal aneurysm)  - [place], RETROPERITNL ABD,  LTD  14. Medication management  - Urinalysis, Routine w reflex microscopic - Microalbumin / creatinine urine ratio - Uric acid - Magnesium - Lipid panel - TSH - Hemoglobin A1c - VITAMIN D 25 Hydroxy       Patient was counseled in prudent diet, weight control to achieve/maintain BMI less than 25, BP monitoring, regular exercise and medications as discussed.  Discussed med effects and SE's. Routine screening labs and tests as requested with regular follow-up as recommended. Over 40 minutes of exam, counseling, chart review and high complex critical decision making was performed

## 2018-06-27 LAB — URINALYSIS, ROUTINE W REFLEX MICROSCOPIC
Bacteria, UA: NONE SEEN /HPF
Bilirubin Urine: NEGATIVE
Glucose, UA: NEGATIVE
Hgb urine dipstick: NEGATIVE
Hyaline Cast: NONE SEEN /LPF
Ketones, ur: NEGATIVE
Leukocytes, UA: NEGATIVE
Nitrite: NEGATIVE
RBC / HPF: NONE SEEN /HPF (ref 0–2)
Specific Gravity, Urine: 1.017 (ref 1.001–1.03)
Squamous Epithelial / HPF: NONE SEEN /HPF (ref <=5)
WBC, UA: NONE SEEN /HPF (ref 0–5)
pH: 6.5 (ref 5.0–8.0)

## 2018-06-27 LAB — PSA: PSA: 0.8 ng/mL (ref <=4.0)

## 2018-06-27 LAB — SPECIMEN COMPROMISED

## 2018-06-27 LAB — MICROALBUMIN / CREATININE URINE RATIO
Creatinine, Urine: 146 mg/dL (ref 20–320)
Microalb Creat Ratio: 16 mcg/mg creat (ref <30)
Microalb, Ur: 2.4 mg/dL

## 2018-06-27 LAB — HEMOGLOBIN A1C
Hgb A1c MFr Bld: 5.5 % of total Hgb (ref <5.7)
Mean Plasma Glucose: 111 (calc)
eAG (mmol/L): 6.2 (calc)

## 2018-06-27 LAB — LIPID PANEL
Cholesterol: 116 mg/dL (ref <200)
HDL: 42 mg/dL (ref >40)
LDL Cholesterol (Calc): 54 mg/dL (calc)
Non-HDL Cholesterol (Calc): 74 mg/dL (calc) (ref <130)
Total CHOL/HDL Ratio: 2.8 (calc) (ref <5.0)
Triglycerides: 118 mg/dL (ref <150)

## 2018-06-27 LAB — VITAMIN D 25 HYDROXY (VIT D DEFICIENCY, FRACTURES): Vit D, 25-Hydroxy: 59 ng/mL (ref 30–100)

## 2018-06-27 LAB — URIC ACID: Uric Acid, Serum: 7.3 mg/dL (ref 4.0–8.0)

## 2018-06-27 LAB — MAGNESIUM: Magnesium: 1.5 mg/dL (ref 1.5–2.5)

## 2018-06-27 LAB — TSH: TSH: 1.13 mIU/L (ref 0.40–4.50)

## 2018-07-04 ENCOUNTER — Ambulatory Visit (INDEPENDENT_AMBULATORY_CARE_PROVIDER_SITE_OTHER): Payer: Medicare Other

## 2018-07-04 DIAGNOSIS — Z23 Encounter for immunization: Secondary | ICD-10-CM | POA: Diagnosis not present

## 2018-07-07 ENCOUNTER — Other Ambulatory Visit: Payer: Self-pay | Admitting: Internal Medicine

## 2018-07-14 ENCOUNTER — Other Ambulatory Visit: Payer: Self-pay | Admitting: Internal Medicine

## 2018-07-24 MED FILL — IMBRUVICA 420 MG TAB: 420 | 28 days supply | Qty: 28 | Fill #1

## 2018-08-01 ENCOUNTER — Inpatient Hospital Stay (HOSPITAL_BASED_OUTPATIENT_CLINIC_OR_DEPARTMENT_OTHER): Payer: Medicare Other | Admitting: Oncology

## 2018-08-01 ENCOUNTER — Telehealth: Payer: Self-pay

## 2018-08-01 ENCOUNTER — Inpatient Hospital Stay: Payer: Medicare Other | Attending: Oncology

## 2018-08-01 VITALS — BP 112/49 | HR 73 | Temp 97.5°F | Resp 17 | Wt 162.3 lb

## 2018-08-01 DIAGNOSIS — D630 Anemia in neoplastic disease: Secondary | ICD-10-CM | POA: Diagnosis not present

## 2018-08-01 DIAGNOSIS — Z79899 Other long term (current) drug therapy: Secondary | ICD-10-CM | POA: Insufficient documentation

## 2018-08-01 DIAGNOSIS — C9110 Chronic lymphocytic leukemia of B-cell type not having achieved remission: Secondary | ICD-10-CM

## 2018-08-01 LAB — CMP (CANCER CENTER ONLY)
ALT: 20 U/L (ref 0–44)
AST: 20 U/L (ref 15–41)
Albumin: 3.7 g/dL (ref 3.5–5.0)
Alkaline Phosphatase: 74 U/L (ref 38–126)
Anion gap: 8 (ref 5–15)
BUN: 22 mg/dL (ref 8–23)
CO2: 28 mmol/L (ref 22–32)
Calcium: 10.2 mg/dL (ref 8.9–10.3)
Chloride: 103 mmol/L (ref 98–111)
Creatinine: 1.54 mg/dL — ABNORMAL HIGH (ref 0.61–1.24)
GFR, Est AFR Am: 50 mL/min — ABNORMAL LOW (ref >60)
GFR, Estimated: 43 mL/min — ABNORMAL LOW (ref >60)
Glucose, Bld: 117 mg/dL — ABNORMAL HIGH (ref 70–99)
Potassium: 4.9 mmol/L (ref 3.5–5.1)
Sodium: 139 mmol/L (ref 135–145)
Total Bilirubin: 0.5 mg/dL (ref 0.3–1.2)
Total Protein: 7.1 g/dL (ref 6.5–8.1)

## 2018-08-01 LAB — CBC WITH DIFFERENTIAL (CANCER CENTER ONLY)
Abs Immature Granulocytes: 0.2 10*3/uL — ABNORMAL HIGH (ref 0.00–0.07)
Basophils Absolute: 0.2 10*3/uL — ABNORMAL HIGH (ref 0.0–0.1)
Basophils Relative: 0 %
Eosinophils Absolute: 0.6 10*3/uL — ABNORMAL HIGH (ref 0.0–0.5)
Eosinophils Relative: 1 %
HCT: 28.6 % — ABNORMAL LOW (ref 39.0–52.0)
Hemoglobin: 8.5 g/dL — ABNORMAL LOW (ref 13.0–17.0)
Immature Granulocytes: 0 %
Lymphocytes Relative: 94 %
Lymphs Abs: 91.7 10*3/uL — ABNORMAL HIGH (ref 0.7–4.0)
MCH: 22.3 pg — ABNORMAL LOW (ref 26.0–34.0)
MCHC: 29.7 g/dL — ABNORMAL LOW (ref 30.0–36.0)
MCV: 74.9 fL — ABNORMAL LOW (ref 80.0–100.0)
Monocytes Absolute: 1.1 10*3/uL — ABNORMAL HIGH (ref 0.1–1.0)
Monocytes Relative: 1 %
Neutro Abs: 3.5 10*3/uL (ref 1.7–7.7)
Neutrophils Relative %: 4 %
Platelet Count: 316 10*3/uL (ref 150–400)
RBC: 3.82 MIL/uL — ABNORMAL LOW (ref 4.22–5.81)
RDW: 16.5 % — ABNORMAL HIGH (ref 11.5–15.5)
WBC Count: 97.3 10*3/uL (ref 4.0–10.5)
nRBC: 0 % (ref 0.0–0.2)

## 2018-08-01 NOTE — Telephone Encounter (Signed)
Printed avs and calender of upcoming appointment. Per 11/15 os

## 2018-08-01 NOTE — Progress Notes (Signed)
Hematology and Oncology Follow Up Visit  MATTI MINNEY [id number] 08/30/1946 72 y.o. 08/01/2018 12:59 PM [name], MDMcKeown, [name], MD   Principle Diagnosis: 72 year old man with CLL diagnosed in 2017.  He presented with lymphadenopathy and leukocytosis at that time.     Current therapy: Imbruvica 420 mg daily started in August 2018.   Interim History:  Mr. Aliano presents today for a follow-up.  Since last visit, he reports no major changes in his health.  He continues to tolerate ibrutinib without any new side effects.  He denies any recent fatigue, nausea or palpitation.  He denies any skin changes or bleeding issues.  He denies any palpable adenopathy.  His performance status and quality of life is improving.  He is no longer working but feels physically able to if he wanted to.  His appetite has been excellent and has gained more weight slowly.  He does not report any headaches, blurry vision, or seizures. He does not report any confusion or lethargy.  He denies any fevers or chills or sweats.  Does not report any chest pain, palpitation, orthopnea or leg edema. He does not report any hemoptysis. Does not report any nausea, vomiting or abdominal distention.  Denies any changes in his bowel habits. He does not report any frequency urgency or hematuria.  He denies any easy bruising or ecchymosis.  He denies any skin irritation or petechiae.  He denies any heat or cold intolerance.  He denies any anxiety or depression. He denies any bleeding or clotting tendency.  Remaining review of system is negative.     Medications: I have reviewed the patient's current medications.  Current Outpatient Medications  Medication Sig Dispense Refill  . acetaminophen (TYLENOL) 500 MG tablet Take 1,000 mg by mouth every 6 (six) hours as needed for moderate pain or headache.    Marland Kitchen aspirin 81 MG tablet Take 81 mg by mouth daily.    Marland Kitchen atenolol (TENORMIN) 25 MG tablet TAKE ONE TABLET BY MOUTH AT  BEDTIME (Patient taking differently: TAKE 25 MG BY MOUTH AT BEDTIME) 90 tablet 1  . atorvastatin (LIPITOR) 80 MG tablet TAKE 1 TABLET A DAY 90 tablet 1  . Cholecalciferol (VITAMIN D3) 1000 units CAPS Take 2,000 Units by mouth daily.    . IMBRUVICA 420 MG TABS TAKE 1 TABLET (420 MG) BY MOUTH DAILY. TAKE WITH A GLASS OF WATER AT APPROX THE SAME TIME EACH DAY. 28 tablet 1  . Insulin Pen Needle (B-D ULTRAFINE III SHORT PEN) 31G X 8 MM MISC USE DAILY WITH LEVEMIR. DX-E11.29. 100 each 2  . LEVEMIR FLEXTOUCH 100 UNIT/ML Pen INJECT 30 UNITS INTO THE SKIN DAILY 15 pen 3  . loratadine (CLARITIN) 10 MG tablet Take 1 tablet daily for Allergies (Patient taking differently: Take 10 mg by mouth daily as needed for allergies. ) 360 tablet 1  . metFORMIN (GLUCOPHAGE) 1000 MG tablet TAKE ONE TABLET BY MOUTH TWICE A DAY 180 tablet 0  . mirtazapine (REMERON) 30 MG tablet Take 1/2 to 1 tablet 1 hour before bedtime  for appetite & sleep 30 tablet 2  . Tetrahydrozoline HCl (VISINE OP) Place 2 drops into both eyes daily as needed (for dry eyes).     No current facility-administered medications for this visit.      Allergies:  Allergies  Allergen Reactions  . Amaryl [Glimepiride] Diarrhea  . Codeine Itching  . Pioglitazone Diarrhea, Itching and Anxiety    Past Medical History, Surgical history, Social history, and Family History  were reviewed and updated.   Physical Exam:  Blood pressure (!) 112/49, pulse 73, temperature (!) 97.5 F (36.4 C), temperature source Oral, resp. rate 17, weight 162 lb 5 oz (73.6 kg), SpO2 98 %.     ECOG: 1   General appearance: Alert, awake without any distress. Head: Atraumatic without abnormalities Oropharynx: Without any thrush or ulcers. Eyes: No scleral icterus. Lymph nodes: No lymphadenopathy noted in the cervical, supraclavicular, or axillary nodes Heart:regular rate and rhythm, without any murmurs or gallops.   Lung: Clear to auscultation without any rhonchi,  wheezes or dullness to percussion. Abdomin: Soft, nontender without any shifting dullness or ascites. Musculoskeletal: No clubbing or cyanosis. Neurological: No motor or sensory deficits. Skin: No rashes or lesions.      Lab Results: Lab Results  Component Value Date   WBC 166.5 (HH) 06/25/2018   HGB 7.8 (L) 06/25/2018   HCT 27.5 (L) 06/25/2018   MCV 76.8 (L) 06/25/2018   PLT 290 06/25/2018     Chemistry      Component Value Date/Time   NA 139 06/25/2018 0846   NA 139 06/26/2017 0813   K 5.0 06/25/2018 0846   K 4.8 06/26/2017 0813   CL 104 06/25/2018 0846   CO2 27 06/25/2018 0846   CO2 28 06/26/2017 0813   BUN 17 06/25/2018 0846   BUN 15.8 06/26/2017 0813   CREATININE 1.26 (H) 06/25/2018 0846   CREATININE 1.28 (H) 03/25/2018 1547   CREATININE 1.1 06/26/2017 0813      Component Value Date/Time   CALCIUM 10.0 06/25/2018 0846   CALCIUM 9.6 06/26/2017 0813   ALKPHOS 82 06/25/2018 0846   ALKPHOS 66 06/26/2017 0813   AST 13 (L) 06/25/2018 0846   AST 20 06/26/2017 0813   ALT 11 06/25/2018 0846   ALT 13 06/26/2017 0813   BILITOT 0.6 06/25/2018 0846   BILITOT 0.65 06/26/2017 0813        Impression and Plan:   72 year old man with the:  1.  CLL diagnosed in 2017.  He was found to have lymphocytosis presented with lymphocytosis and bulky adenopathy diagnosed in 2017.   He is currently on ibrutinib and has tolerated therapy very well.  He has been completely asymptomatic from his disease at this time.  Laboratory data were reviewed from today and showed continuous improvement in his total white cell count without any symptomatic lymphadenopathy.  Risks and benefits of continuing this therapy was discussed today. Alternative therapy were also reviewed.  Discussion today is agreeable to continue with the same dose and schedule.  2.  Anemia: Related to his CLL without any evidence of hemolysis.  His hemoglobin is improving slowly with treatment.  3. Autoimmune  considerations: None reported at this time.  His platelet count continues to be within normal range without any evidence to suggest autoimmune hemolytic anemia.  4. Infectious considerations: No recurrent infections reported at this time.  5.  Anorexia: His appetite has improved and his weight is improving as well.  6.  Tumor lysis syndrome: No evidence of that noted.  Electrolytes and uric acid have been within normal range.  7. Follow-up: Will be weeks to follow his progress.  15  minutes was spent with the patient face-to-face today.  More than 50% of time was dedicated to discussing his disease status, laboratory data and coordinating future plan of care.   Zola Button, MD 11/15/201912:59 PM

## 2018-08-15 ENCOUNTER — Other Ambulatory Visit: Payer: Self-pay | Admitting: Oncology

## 2018-08-15 DIAGNOSIS — C9110 Chronic lymphocytic leukemia of B-cell type not having achieved remission: Secondary | ICD-10-CM

## 2018-08-18 MED FILL — IMBRUVICA 420 MG TAB: 420 | 28 days supply | Qty: 28 | Fill #0

## 2018-09-03 IMAGING — CT CT CHEST W/ CM
2 of 5 series · 13 of 36 positions shown, 16 images · IV contrast (APPLIED)
Comparison: None.

CLINICAL DATA: Patient with history of lymphoma.

EXAM:
CT CHEST, ABDOMEN, AND PELVIS WITH CONTRAST
TECHNIQUE: Multidetector CT imaging of the chest, abdomen and pelvis was
performed following the standard protocol during bolus
administration of intravenous contrast.
CONTRAST:  100mL 6NSXWM-7WW IOPAMIDOL (6NSXWM-7WW) INJECTION 61%

[Series 2: cap with · axial · 0.88mm/px · z∈[-972,-477]mm · 10 of 121 slices shown, 13 images]
[im 11/121  mediastinal]
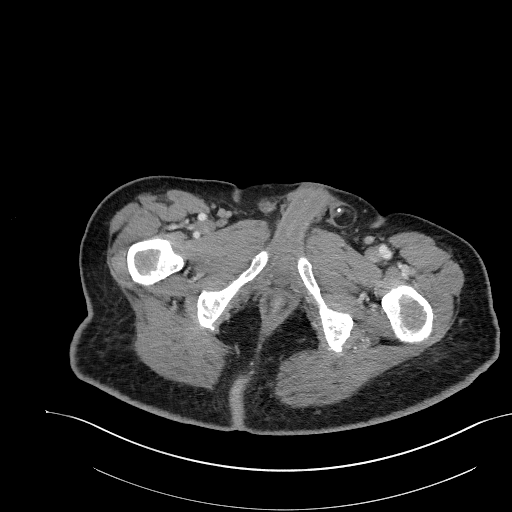
[im 11/121  lung]
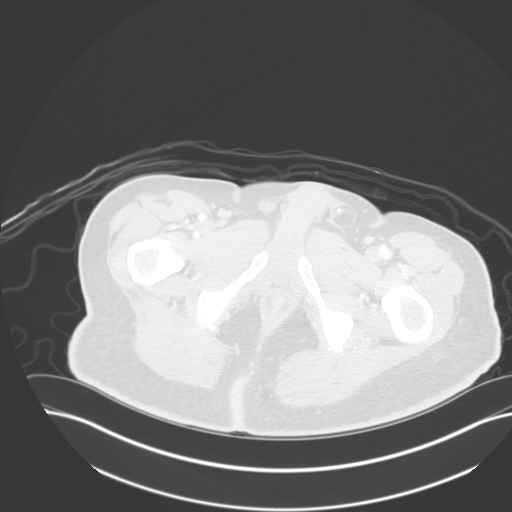
[im 22/121  lung]
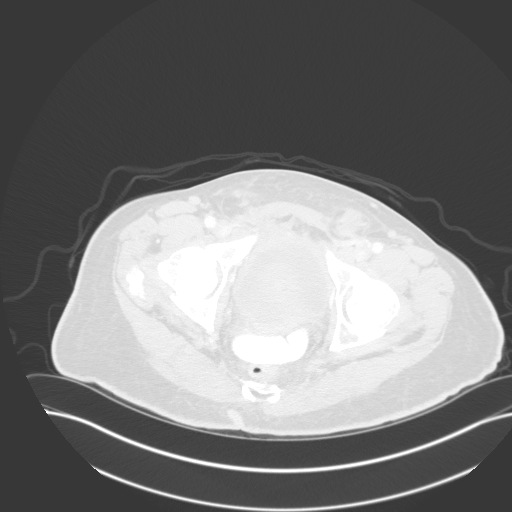
[im 33/121  lung]
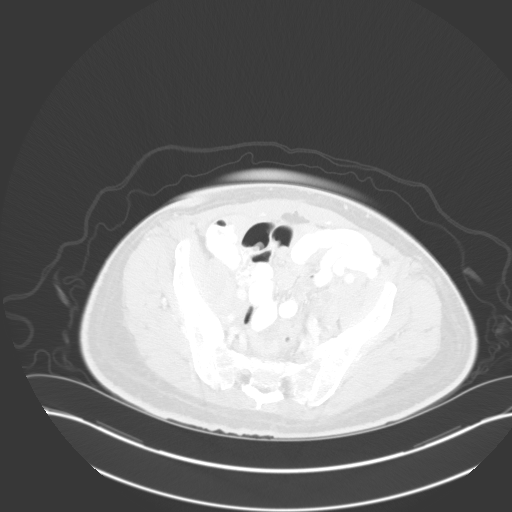
[im 44/121  lung]
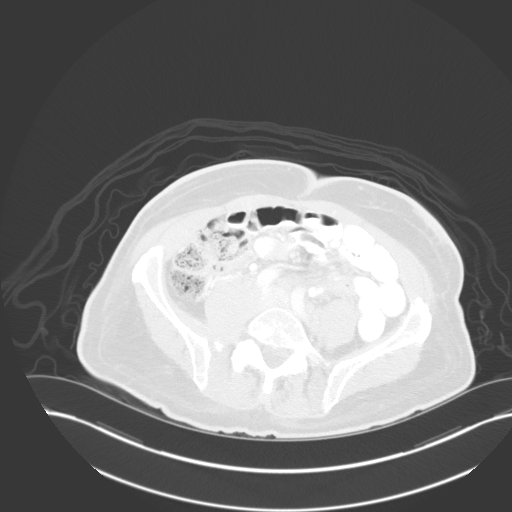
[im 55/121  mediastinal]
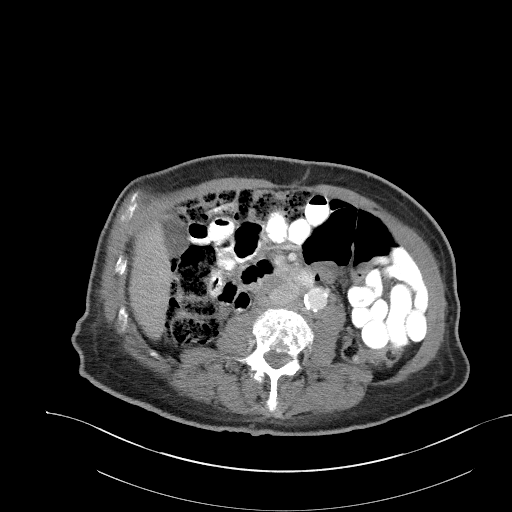
[im 55/121  lung]
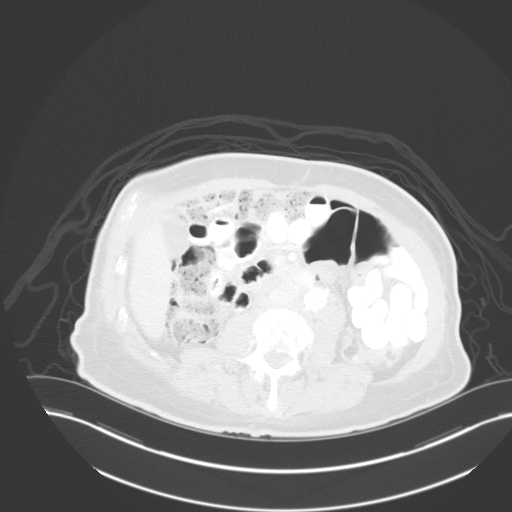
[im 66/121  lung]
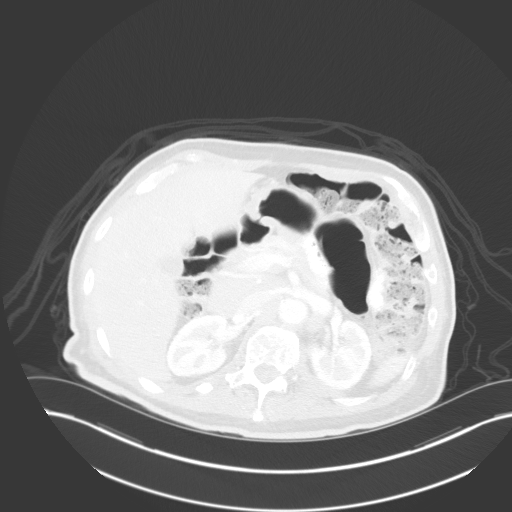
[im 77/121  lung]
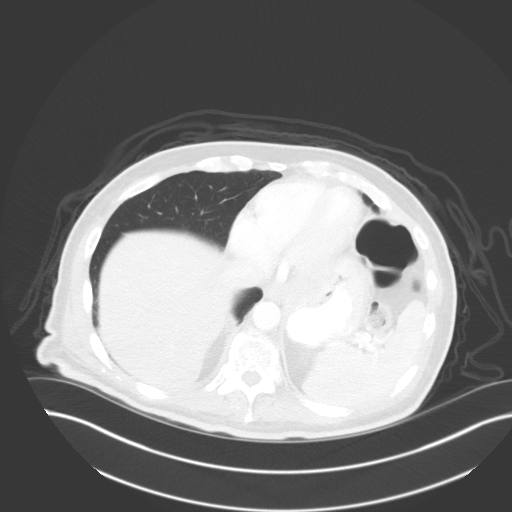
[im 88/121  lung]
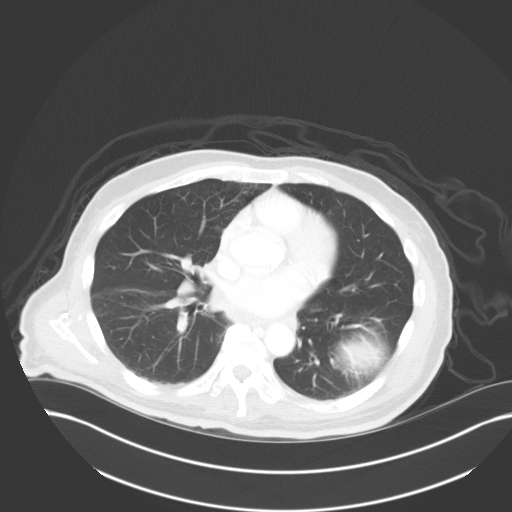
[im 99/121  mediastinal]
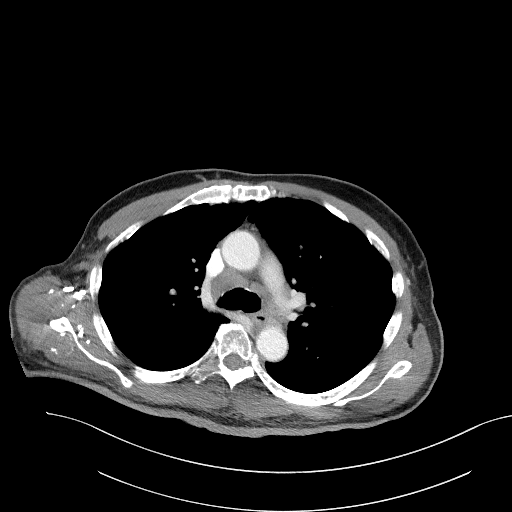
[im 99/121  lung]
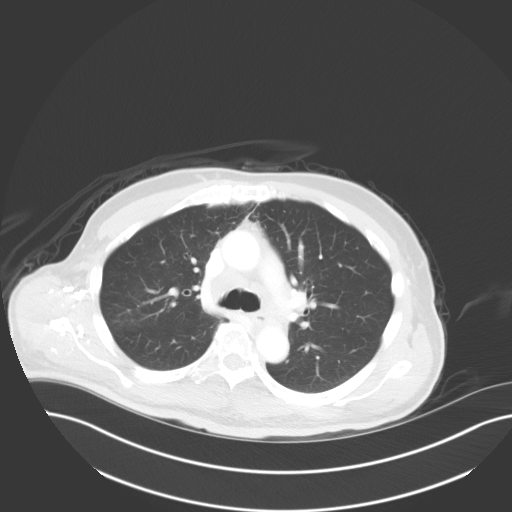
[im 110/121  lung]
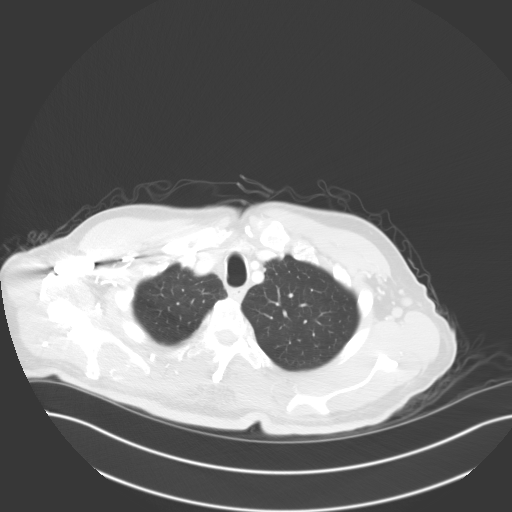

[Series 4: coronals · coronal · 0.80mm/px · 3 of 135 slices shown]
[im 27/135  lung]
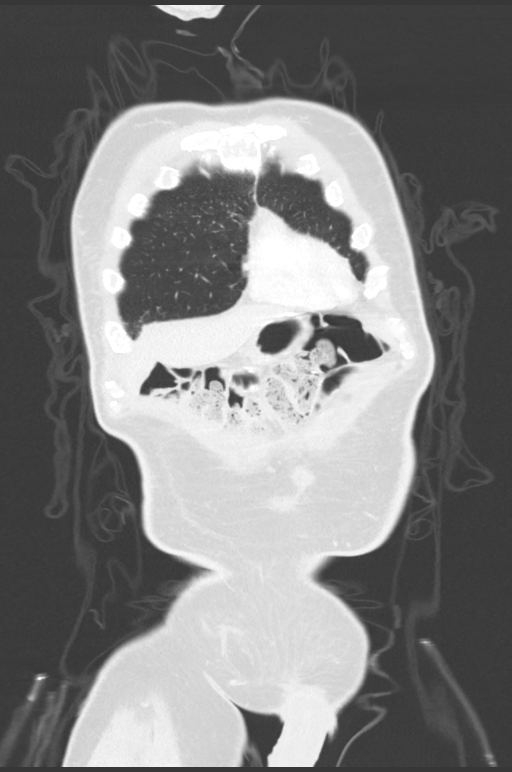
[im 54/135  lung]
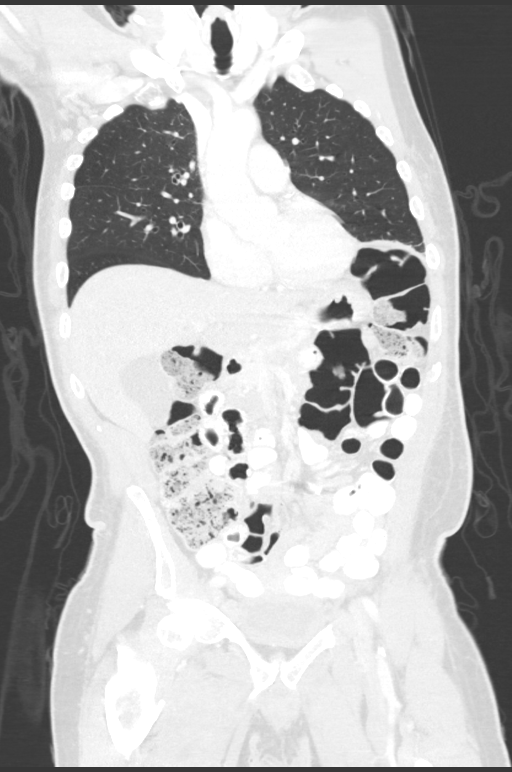
[im 81/135  lung]
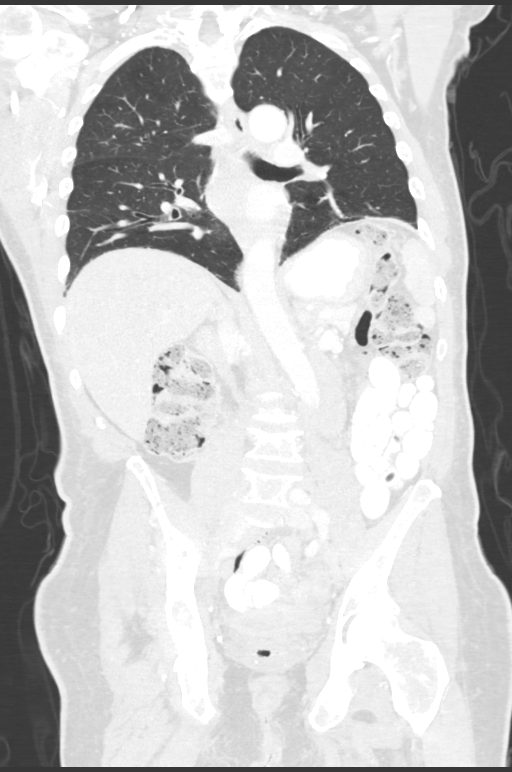

[13 of 36 positions shown; findings below may reference images not displayed]

FINDINGS: CT CHEST FINDINGS

Cardiovascular: Normal heart size. Coronary arterial vascular
calcifications.

Mediastinum/Nodes: Enlarged axillary mediastinal adenopathy.
Reference 1.0 cm right axillary lymph node (image 18; series 2).
Reference 1.0 cm left axillary lymph node (image 18; series 2).
Reference 3.6 x 1.8 cm right paratracheal nodal soft tissue (image
21; series 2). Reference 2.4 cm subcarinal node (image 28; series
2).

Lungs/Pleura: Central airways are patent. Dependent atelectasis
within the bilateral lower lobes. Calcified granuloma right upper
lobe. No pleural effusion or pneumothorax.

Musculoskeletal: 10 mm lytic lesion within the left sixth rib (image
72; series 6). Additionally, there is suggestion of multiple tiny
lytic lesions throughout the majority of the ribs and spine.

CT ABDOMEN PELVIS FINDINGS

Hepatobiliary: The liver is normal in size and contour. No focal
hepatic lesion identified. Gallbladder is unremarkable.

Pancreas: Unremarkable

Spleen: Heterogeneity, particularly of the superior aspect of the
spleen (image 40; series 2).

Adrenals/Urinary Tract: Normal adrenal glands. Kidneys enhance
symmetrically with contrast. No hydronephrosis. Urinary bladder wall
thickening.

Stomach/Bowel: Normal morphology of the stomach. No abnormal bowel
wall thickening or evidence for bowel obstruction.

Vascular/Lymphatic: Tortuous yet normal caliber abdominal aorta.
Peripheral calcified atherosclerotic plaque. Abnormal soft tissue is
demonstrated within the central upper abdomen and along the course
of the abdominal aorta. Within the upper abdomen there is a 5.9 x
2.7 cm porta hepatic node (image 56; series 2). Additionally, there
is confluent soft tissue surrounding the aorta and adjacent IVC
(image 60; series 2). Abnormal soft tissue within the anterior lower
abdomen within the small bowel mesentery measuring up to 3.3 x
cm (image 94; series 2). There is a 2.3 cm right pelvic sidewall
lymph node (image 94; series [DATE] cm left inguinal lymph node
(image 106; series 2).

Reproductive: Prostate is unremarkable.

Other: Bilateral fat containing inguinal hernias. Small amount of
fluid and fat stranding within the left inguinal hernia.

Musculoskeletal: Extensive patchy lucencies demonstrated throughout
the lumbar spine and pelvis. Wedge compression deformities of the
T11, T12, L1-L5 vertebral bodies. Overall these are similar when
compared to CT abdomen pelvis 12/13/2017.
IMPRESSION: 1. Bulky enlarged adenopathy within the mediastinum, porta hepatis,
pelvis and retroperitoneum with additional enlarged bilateral
axillary lymph nodes, compatible with history of lymphoma.
Additionally there is abnormal soft tissue within the anterior lower
abdomen, arising from the mesentery, suggestive of lymphomatous
involvement.
2. Extensive tiny lucencies throughout the axial and appendicular
skeleton most compatible with diffuse osseous metastatic disease.
3. Heterogeneity of the spleen, likely secondary to early phase of
contrast enhancement. Recommend attention on follow-up as
lymphomatous involvement not excluded.
4. Multilevel wedge compression deformities involving the T11-L5
vertebral bodies, similar when compared to CT abdomen pelvis
12/13/2017. Recommend correlation for point tenderness.

## 2018-09-09 ENCOUNTER — Inpatient Hospital Stay: Payer: Medicare Other | Attending: Oncology

## 2018-09-09 ENCOUNTER — Telehealth: Payer: Self-pay

## 2018-09-09 ENCOUNTER — Inpatient Hospital Stay (HOSPITAL_BASED_OUTPATIENT_CLINIC_OR_DEPARTMENT_OTHER): Payer: Medicare Other | Admitting: Oncology

## 2018-09-09 VITALS — BP 130/59 | HR 77 | Temp 97.8°F | Resp 17 | Ht 65.0 in | Wt 161.5 lb

## 2018-09-09 DIAGNOSIS — Z7982 Long term (current) use of aspirin: Secondary | ICD-10-CM

## 2018-09-09 DIAGNOSIS — D649 Anemia, unspecified: Secondary | ICD-10-CM

## 2018-09-09 DIAGNOSIS — C9110 Chronic lymphocytic leukemia of B-cell type not having achieved remission: Secondary | ICD-10-CM | POA: Insufficient documentation

## 2018-09-09 DIAGNOSIS — Z79899 Other long term (current) drug therapy: Secondary | ICD-10-CM

## 2018-09-09 DIAGNOSIS — E883 Tumor lysis syndrome: Secondary | ICD-10-CM | POA: Insufficient documentation

## 2018-09-09 LAB — CMP (CANCER CENTER ONLY)
ALT: 42 U/L (ref 0–44)
AST: 40 U/L (ref 15–41)
Albumin: 3.9 g/dL (ref 3.5–5.0)
Alkaline Phosphatase: 72 U/L (ref 38–126)
Anion gap: 8 (ref 5–15)
BUN: 22 mg/dL (ref 8–23)
CO2: 26 mmol/L (ref 22–32)
Calcium: 10.7 mg/dL — ABNORMAL HIGH (ref 8.9–10.3)
Chloride: 102 mmol/L (ref 98–111)
Creatinine: 1.38 mg/dL — ABNORMAL HIGH (ref 0.61–1.24)
GFR, Est AFR Am: 58 mL/min — ABNORMAL LOW (ref >60)
GFR, Estimated: 50 mL/min — ABNORMAL LOW (ref >60)
Glucose, Bld: 81 mg/dL (ref 70–99)
Potassium: 4.8 mmol/L (ref 3.5–5.1)
Sodium: 136 mmol/L (ref 135–145)
Total Bilirubin: 0.5 mg/dL (ref 0.3–1.2)
Total Protein: 7.3 g/dL (ref 6.5–8.1)

## 2018-09-09 LAB — CBC WITH DIFFERENTIAL (CANCER CENTER ONLY)
Abs Immature Granulocytes: 0.36 10*3/uL — ABNORMAL HIGH (ref 0.00–0.07)
Basophils Absolute: 0.3 10*3/uL — ABNORMAL HIGH (ref 0.0–0.1)
Basophils Relative: 1 %
Eosinophils Absolute: 0.8 10*3/uL — ABNORMAL HIGH (ref 0.0–0.5)
Eosinophils Relative: 2 %
HCT: 26.1 % — ABNORMAL LOW (ref 39.0–52.0)
Hemoglobin: 8.1 g/dL — ABNORMAL LOW (ref 13.0–17.0)
Immature Granulocytes: 1 %
Lymphocytes Relative: 78 %
Lymphs Abs: 44 10*3/uL — ABNORMAL HIGH (ref 0.7–4.0)
MCH: 22.3 pg — ABNORMAL LOW (ref 26.0–34.0)
MCHC: 31 g/dL (ref 30.0–36.0)
MCV: 71.9 fL — ABNORMAL LOW (ref 80.0–100.0)
Monocytes Absolute: 1.9 10*3/uL — ABNORMAL HIGH (ref 0.1–1.0)
Monocytes Relative: 3 %
Neutro Abs: 8 10*3/uL — ABNORMAL HIGH (ref 1.7–7.7)
Neutrophils Relative %: 15 %
Platelet Count: 354 10*3/uL (ref 150–400)
RBC: 3.63 MIL/uL — ABNORMAL LOW (ref 4.22–5.81)
RDW: 16.2 % — ABNORMAL HIGH (ref 11.5–15.5)
WBC Count: 55.4 10*3/uL (ref 4.0–10.5)
nRBC: 0 % (ref 0.0–0.2)

## 2018-09-09 LAB — FERRITIN: Ferritin: 143 ng/mL (ref 24–336)

## 2018-09-09 LAB — IRON AND TIBC
Iron: 26 ug/dL — ABNORMAL LOW (ref 42–163)
Saturation Ratios: 7 % — ABNORMAL LOW (ref 20–55)
TIBC: 366 ug/dL (ref 202–409)
UIBC: 340 ug/dL (ref 117–376)

## 2018-09-09 NOTE — Telephone Encounter (Signed)
Printed avs and calender of upcoming appointment. Per 12/23 los 

## 2018-09-09 NOTE — Progress Notes (Signed)
Hematology and Oncology Follow Up Visit  Nathan Davis [id number] [birth year] 72 y.o. 09/09/2018 10:04 AM [name], MDMcKeown, [name], MD   Principle Diagnosis: 72 year old man with CLL presented with leukocytosis and lymphadenopathy in 2017.     Current therapy: Imbruvica 420 mg daily started in August 2019.   Interim History:  Mr. Nathan Davis returns today for repeat evaluation.  Since last visit, he reports no changes in his health.  He does report some mild fatigue but no changes in his performance status or quality of life.  He continues to be on Imbruvica and has not reported any new issues.  He denies any bruising, bleeding or palpitation.  He does report some cramps in his hands but otherwise managing reasonably well.  Performance status and quality of life remain excellent.  He does not report any headaches, blurry vision, or seizures. He does not report any alteration of mental status or dizziness.  He denies any fevers or chills or sweats.  Does not report any chest pain, palpitation, orthopnea or leg edema. He does not report any hemoptysis. Does not report any nausea, vomiting or early satiety.  Denies any constipation or diarrhea.  He does not report any frequency urgency or hematuria.  He denies any easy bruising or bleeding.  He denies any skin irritation or petechiae.  He denies any changes in his mood.  Remaining review of system is negative.     Medications: I have reviewed the patient's current medications.  Current Outpatient Medications  Medication Sig Dispense Refill  . acetaminophen (TYLENOL) 500 MG tablet Take 1,000 mg by mouth every 6 (six) hours as needed for moderate pain or headache.    Marland Kitchen aspirin 81 MG tablet Take 81 mg by mouth daily.    Marland Kitchen atenolol (TENORMIN) 25 MG tablet TAKE ONE TABLET BY MOUTH AT BEDTIME (Patient taking differently: TAKE 25 MG BY MOUTH AT BEDTIME) 90 tablet 1  . atorvastatin (LIPITOR) 80 MG tablet TAKE 1 TABLET A DAY 90 tablet 1  .  Cholecalciferol (VITAMIN D3) 1000 units CAPS Take 2,000 Units by mouth daily.    . IMBRUVICA 420 MG TABS TAKE 1 TABLET (420 MG) BY MOUTH DAILY. TAKE WITH A GLASS OF WATER AT APPROX THE SAME TIME EACH DAY. 28 tablet 1  . Insulin Pen Needle (B-D ULTRAFINE III SHORT PEN) 31G X 8 MM MISC USE DAILY WITH LEVEMIR. DX-E11.29. 100 each 2  . LEVEMIR FLEXTOUCH 100 UNIT/ML Pen INJECT 30 UNITS INTO THE SKIN DAILY 15 pen 3  . loratadine (CLARITIN) 10 MG tablet Take 1 tablet daily for Allergies (Patient taking differently: Take 10 mg by mouth daily as needed for allergies. ) 360 tablet 1  . metFORMIN (GLUCOPHAGE) 1000 MG tablet TAKE ONE TABLET BY MOUTH TWICE A DAY 180 tablet 0  . mirtazapine (REMERON) 30 MG tablet Take 1/2 to 1 tablet 1 hour before bedtime  for appetite & sleep 30 tablet 2  . Tetrahydrozoline HCl (VISINE OP) Place 2 drops into both eyes daily as needed (for dry eyes).     No current facility-administered medications for this visit.      Allergies:  Allergies  Allergen Reactions  . Amaryl [Glimepiride] Diarrhea  . Codeine Itching  . Pioglitazone Diarrhea, Itching and Anxiety    Past Medical History, Surgical history, Social history, and Family History were reviewed and updated.   Physical Exam:  Blood pressure (!) 130/59, pulse 77, temperature 97.8 F (36.6 C), temperature source Oral, resp. rate 17, height 5'  5" (1.651 m), weight 161 lb 8 oz (73.3 kg), SpO2 96 %.     ECOG: 1   General appearance: Comfortable appearing without any discomfort Head: Normocephalic without any trauma Oropharynx: Mucous membranes are moist and pink without any thrush or ulcers. Eyes: Pupils are equal and round reactive to light. Lymph nodes: No cervical, supraclavicular, inguinal or axillary lymphadenopathy.   Heart:regular rate and rhythm.  S1 and S2 without leg edema. Lung: Clear without any rhonchi or wheezes.  No dullness to percussion. Abdomin: Soft, nontender, nondistended with good bowel  sounds.  No hepatosplenomegaly. Musculoskeletal: No joint deformity or effusion.  Full range of motion noted. Neurological: No deficits noted on motor, sensory and deep tendon reflex exam. Skin: No petechial rash or dryness.  Appeared moist.       Lab Results: Lab Results  Component Value Date   WBC 55.4 (HH) 09/09/2018   HGB 8.1 (L) 09/09/2018   HCT 26.1 (L) 09/09/2018   MCV 71.9 (L) 09/09/2018   PLT 354 09/09/2018     Chemistry      Component Value Date/Time   NA 139 08/01/2018 1250   NA 139 06/26/2017 0813   K 4.9 08/01/2018 1250   K 4.8 06/26/2017 0813   CL 103 08/01/2018 1250   CO2 28 08/01/2018 1250   CO2 28 06/26/2017 0813   BUN 22 08/01/2018 1250   BUN 15.8 06/26/2017 0813   CREATININE 1.54 (H) 08/01/2018 1250   CREATININE 1.28 (H) 03/25/2018 1547   CREATININE 1.1 06/26/2017 0813      Component Value Date/Time   CALCIUM 10.2 08/01/2018 1250   CALCIUM 9.6 06/26/2017 0813   ALKPHOS 74 08/01/2018 1250   ALKPHOS 66 06/26/2017 0813   AST 20 08/01/2018 1250   AST 20 06/26/2017 0813   ALT 20 08/01/2018 1250   ALT 13 06/26/2017 0813   BILITOT 0.5 08/01/2018 1250   BILITOT 0.65 06/26/2017 0813        Impression and Plan:   72 year old man with the:  1.  CLL presented with lymphadenopathy and leukocytosis in 2017 and required treatment in 2019.   He has tolerated Imbruvica with excellent response to therapy so far and reduction in his lymphocyte count.  He has no constitutional symptoms or complications related to therapy.  Risks and benefits of continuing this treatment for the time being was reviewed and is agreeable to continue.  We will continue to monitor his counts closely on this therapy.  Different salvage therapy may be needed he develops complications related to Imbruvica.  2.  Anemia: Hemoglobin remains stable and does not require any transfusion.  This is related to CLL.  3. Autoimmune considerations: No autoimmune thrombocytopenia noted at this  time.  4. Infectious considerations: He is at risk of opportunistic infection given his CLL.  None reported at this time.  5.  Anorexia: His weight is stable appetite remains improved.  6.  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  7. Follow-up: Will be in 4 to 5 weeks.  15  minutes was spent with the patient face-to-face today.  More than 50% of time was dedicated to reviewing laboratory data, complications related to therapy and answering questions regarding plan of care.   Zola Button, MD 12/24/201910:04 AM

## 2018-09-19 MED FILL — IMBRUVICA 420 MG TAB: 420 | 28 days supply | Qty: 28 | Fill #1

## 2018-10-01 NOTE — Progress Notes (Signed)
MEDICARE ANNUAL WELLNESS VISIT AND FOLLOW UP Assessment:   Diagnoses and all orders for this visit:  Encounter for Medicare annual wellness exam  PVC (premature ventricular contraction) Control BP, followed by cardiology  Essential hypertension Bps low- cut atenolol in 1/2 to 12.5 and monitor closely Monitor blood pressure at home; call if consistently over 130/80 Continue DASH diet.   Reminder to go to the ER if any CP, SOB, nausea, dizziness, severe HA, changes vision/speech, left arm numbness and tingling and jaw pain.  Insulin dependent Q2IW with renal complications [facility]) Education: Reviewed 'ABCs' of diabetes management (respective goals in parentheses):  A1C (goal <7), blood pressure (<130/80), and cholesterol (LDL <70) Eye Exam yearly and Dental Exam every 6 months- will call to schedule eye exam - due for this year Dietary recommendations Physical Activity recommendations A1Cs controlled in normal range - pending A1C results, decrease levemir from 30 units to 25 units  CKD stage 2 due to type 2 diabetes mellitus (HCC) Increase fluids, avoid NSAIDS, monitor sugars, will monitor CMP/GFR  Compression fracture L2, sequela Declined surgery, manages fairly with tylenol most days, wears brace as needed Follow up ortho PRN  Vitamin D deficiency Continue supplementation Check vitamin D level  Medication management CBC, CMP/GFR  Hyperlipidemia, unspecified hyperlipidemia type Discussed goal LDL <70 Continue medications Continue low cholesterol diet and exercise.  Check lipid panel.   CLL (chronic lymphocytic leukemia) (HCC) CBC, followed by Dr. Alen Blew  Left inguinal hernia S/p surgical repair   Over 30 minutes of exam, counseling, chart review, and critical decision making was performed  Future Appointments  Date Time Provider [place]  10/24/2018  3:00 PM [facility] 6 [facility] None  10/24/2018  3:30 PM Wyatt Portela, MD [facility] None  01/14/2019   9:30 AM [name], MD [facility] None  07/29/2019  2:00 PM [name], MD [facility] None     Plan:   During the course of the visit the patient was educated and counseled about appropriate screening and preventive services including:    Pneumococcal vaccine   Influenza vaccine  Prevnar 13  Td vaccine  Screening electrocardiogram  Colorectal cancer screening  Diabetes screening  Glaucoma screening  Nutrition counseling    Subjective:  Nathan Davis is a 73 y.o. male who presents for Medicare Annual Wellness Visit and 3 month follow up for HTN, hyperlipidemia, T2DM, and vitamin D Def.   Also patient is dx'd with CLL followed by [facility] by Dr Alen Blew. Patient had a negative Stress Test in 2011 and has been followed by Dr Aundra Dubin.  He had bilateral inguinal hernia surgery by Dr. Coralie Keens last year  He had fall with compression fraction at L2, opted against surgery and managing faily with tylenol.   BMI is Body mass index is 26.99 kg/m., he has been working on diet. Wt Readings from Last 3 Encounters:  10/02/18 162 lb 3.2 oz (73.6 kg)  09/09/18 161 lb 8 oz (73.3 kg)  08/01/18 162 lb 5 oz (73.6 kg)   His blood pressure has been controlled at home, today their BP is BP: (!) 106/58 He does not workout (r/t back pain). He denies chest pain, shortness of breath, dizziness.   He is on cholesterol medication (atorvastatin 40 mg daily) and denies myalgias. His cholesterol is at goal. The cholesterol last visit was:   Lab Results  Component Value Date   CHOL 116 06/26/2018   HDL 42 06/26/2018   LDLCALC 54 06/26/2018   TRIG 118  06/26/2018   CHOLHDL 2.8 06/26/2018   He has been working on diet for T2 diabetes, very well controlled on metformin 2000 mg and levemir 30 units dialy and denies foot ulcerations, increased appetite, nausea, paresthesia of the feet, polydipsia, polyuria, visual disturbances, vomiting and weight loss. He does check  a fasting glucose, and has been consistently <130. Last A1C in the office was:  Lab Results  Component Value Date   HGBA1C 5.5 06/26/2018   Last GFR Lab Results  Component Value Date   GFRAA 15 (L) 09/09/2018   Patient is on Vitamin D supplement at essentially at goal of 60 at recent check:  Lab Results  Component Value Date   VD25OH 59 06/26/2018      Medication Review:  Current Outpatient Medications (Endocrine & Metabolic):  Marland Kitchen  LEVEMIR FLEXTOUCH 100 UNIT/ML Pen, INJECT 30 UNITS INTO THE SKIN DAILY .  metFORMIN (GLUCOPHAGE) 1000 MG tablet, TAKE ONE TABLET BY MOUTH TWICE A DAY  Current Outpatient Medications (Cardiovascular):  .  atenolol (TENORMIN) 25 MG tablet, TAKE ONE TABLET BY MOUTH AT BEDTIME (Patient taking differently: TAKE 25 MG BY MOUTH AT BEDTIME) .  atorvastatin (LIPITOR) 80 MG tablet, TAKE 1 TABLET A DAY  Current Outpatient Medications (Respiratory):  .  loratadine (CLARITIN) 10 MG tablet, Take 1 tablet daily for Allergies (Patient taking differently: Take 10 mg by mouth daily as needed for allergies. )  Current Outpatient Medications (Analgesics):  .  acetaminophen (TYLENOL) 500 MG tablet, Take 1,000 mg by mouth every 6 (six) hours as needed for moderate pain or headache. Marland Kitchen  aspirin 81 MG tablet, Take 81 mg by mouth daily.   Current Outpatient Medications (Other):  Marland Kitchen  Cholecalciferol (VITAMIN D3) 1000 units CAPS, Take 2,000 Units by mouth daily. .  IMBRUVICA 420 MG TABS, TAKE 1 TABLET (420 MG) BY MOUTH DAILY. TAKE WITH A GLASS OF WATER AT APPROX THE SAME TIME EACH DAY. Marland Kitchen  Insulin Pen Needle (B-D ULTRAFINE III SHORT PEN) 31G X 8 MM MISC, USE DAILY WITH LEVEMIR. DX-E11.29. .  mirtazapine (REMERON) 30 MG tablet, Take 1/2 to 1 tablet 1 hour before bedtime  for appetite & sleep .  Tetrahydrozoline HCl (VISINE OP), Place 2 drops into both eyes daily as needed (for dry eyes).  Allergies: Allergies  Allergen Reactions  . Amaryl [Glimepiride] Diarrhea  . Codeine  Itching  . Pioglitazone Diarrhea, Itching and Anxiety    Current Problems (verified) has Type 2 diabetes mellitus with stage 3 chronic kidney disease, with long-term current use of insulin ([place]); Essential hypertension; Vitamin D deficiency; Medication management; PVC (premature ventricular contraction); Hyperlipidemia associated with type 2 diabetes mellitus ([place]); CLL (chronic lymphocytic leukemia) ([place]); Insulin-requiring or dependent type II diabetes mellitus ([place]); Compression fracture of C-spine, sequela; Left inguinal hernia; CKD stage 3 due to type 2 diabetes mellitus ([place]); and BPH with obstruction/lower urinary tract symptoms on their problem list.  Screening Tests Immunization History  Administered Date(s) Administered  . Influenza, High Dose Seasonal PF 07/22/2013, 07/04/2018  . Influenza-Unspecified 06/25/2012, 06/10/2017  . PPD Test 08/27/2013  . Pneumococcal Conjugate-13 08/22/2011  . Pneumococcal Polysaccharide-23 05/30/2016  . Td 09/17/2002  . Tdap 08/27/2013  . Zoster 09/17/2005   Preventative care: Last colonoscopy: 02/2010 due 2021  Influenza 2019 Prevnar 13: 2012 Pneumo 2017 TDAP 2014  Zoster 2007  Names of Other Physician/Practitioners you currently use: 1. [facility] here for primary care 2. Dr Gershon Crane, eye doctor, Diabetic Eye Exam  01/30/2018 -  report and abstract confirmed 3. No dentist, has dentures, over 3 years  Patient Care Team: [name], MD as PCP - General (Internal Medicine) [name], MD as Attending Physician (Internal Medicine) Rutherford Guys, MD as Consulting Physician (Ophthalmology) Larey Dresser, MD as Consulting Physician (Cardiology) Inda Castle, MD (Inactive) as Consulting Physician (Gastroenterology) Wyatt Portela, MD as Consulting Physician (Oncology) Frederik Pear, MD as Consulting Physician (Orthopedic Surgery)  Surgical: He  has a past surgical history that includes  ETT - MYOVIEW; nissan fundoplication (N/A, 67/08/4579); Hernia repair; Inguinal hernia repair (Bilateral, 02/06/2018); and Insertion of mesh (Bilateral, 02/06/2018). Family His family history includes Alzheimer's disease in his father; Heart attack (age of onset: 15) in his unknown relative; Hypertension in his unknown relative; Throat cancer in his mother. Social history  He reports that he has never smoked. He has never used smokeless tobacco. He reports that he does not drink alcohol or use drugs.  MEDICARE WELLNESS OBJECTIVES: Physical activity: Current Exercise Habits: The patient does not participate in regular exercise at present, Exercise limited by: orthopedic condition(s)(compression fracture) Cardiac risk factors: Cardiac Risk Factors include: advanced age (>50men, >43 women);male gender;sedentary lifestyle;hypertension;diabetes mellitus;dyslipidemia Depression/mood screen:   Depression screen [facility] 2/9 10/02/2018  Decreased Interest 0  Down, Depressed, Hopeless 0  PHQ - 2 Score 0    ADLs:  In your present state of health, do you have any difficulty performing the following activities: 10/02/2018 06/29/2018  Hearing? N N  Vision? N N  Comment - -  Difficulty concentrating or making decisions? N N  Walking or climbing stairs? Y N  Comment chronic back pain  -  Dressing or bathing? N N  Doing errands, shopping? N N  Some recent data might be hidden     Cognitive Testing  Alert? Yes  Normal Appearance?Yes  Oriented to person? Yes  Place? Yes   Time? Yes  Recall of three objects?  Yes  Can perform simple calculations? Yes  Displays appropriate judgment?Yes  Can read the correct time from a watch face?Yes  EOL planning: Does Patient Have a Medical Advance Directive?: No Would patient like information on creating a medical advance directive?: No - Patient declined   Objective:   Today's Vitals   10/02/18 0845  BP: (!) 106/58  Pulse: 64  Temp: 97.9 F (36.6 C)  SpO2:  95%  Weight: 162 lb 3.2 oz (73.6 kg)  Height: 5\' 5"  (1.651 m)  PainSc: 7   PainLoc: Back   Body mass index is 26.99 kg/m.  General appearance: alert, no distress, WD/WN, male HEENT: normocephalic, sclerae anicteric, TMs pearly, nares patent, no discharge or erythema, pharynx normal Oral cavity: MMM, no lesions Neck: supple, no lymphadenopathy, no thyromegaly, no masses Heart: RRR, normal S1, S2, no murmurs Lungs: CTA bilaterally, no wheezes, rhonchi, or rales Abdomen: +bs, soft, non tender, non distended, no masses, no hepatomegaly, no splenomegaly Musculoskeletal: nontender, no swelling, no obvious deformity. Wearing lumbar brace.  Extremities: scant bilateral ankle edema, no cyanosis, no clubbing Pulses: 2+ symmetric, upper and lower extremities, normal cap refill Neurological: alert, oriented x 3, CN2-12 intact, strength normal upper extremities and lower extremities, sensation normal throughout, slow steady gait Psychiatric: normal affect, behavior normal, pleasant   Medicare Attestation I have personally reviewed: The patient's medical and social history Their use of alcohol, tobacco or illicit drugs Their current medications and supplements The patient's functional ability including ADLs,fall risks, home safety risks, cognitive, and hearing and visual impairment Diet and physical activities  Evidence for depression or mood disorders  The patient's weight, height, BMI, and visual acuity have been recorded in the chart.  I have made referrals, counseling, and provided education to the patient based on review of the above and I have provided the patient with a written personalized care plan for preventive services.     Izora Ribas, NP   10/02/2018

## 2018-10-02 ENCOUNTER — Encounter: Payer: Self-pay | Admitting: Adult Health

## 2018-10-02 ENCOUNTER — Ambulatory Visit: Payer: Medicare Other | Admitting: Adult Health

## 2018-10-02 VITALS — BP 106/58 | HR 64 | Temp 97.9°F | Ht 65.0 in | Wt 162.2 lb

## 2018-10-02 DIAGNOSIS — E559 Vitamin D deficiency, unspecified: Secondary | ICD-10-CM

## 2018-10-02 DIAGNOSIS — N183 Chronic kidney disease, stage 3 unspecified: Secondary | ICD-10-CM

## 2018-10-02 DIAGNOSIS — N138 Other obstructive and reflux uropathy: Secondary | ICD-10-CM

## 2018-10-02 DIAGNOSIS — Z794 Long term (current) use of insulin: Secondary | ICD-10-CM

## 2018-10-02 DIAGNOSIS — I10 Essential (primary) hypertension: Secondary | ICD-10-CM

## 2018-10-02 DIAGNOSIS — C9110 Chronic lymphocytic leukemia of B-cell type not having achieved remission: Secondary | ICD-10-CM

## 2018-10-02 DIAGNOSIS — N401 Enlarged prostate with lower urinary tract symptoms: Secondary | ICD-10-CM

## 2018-10-02 DIAGNOSIS — E1169 Type 2 diabetes mellitus with other specified complication: Secondary | ICD-10-CM

## 2018-10-02 DIAGNOSIS — R6889 Other general symptoms and signs: Secondary | ICD-10-CM

## 2018-10-02 DIAGNOSIS — Z0000 Encounter for general adult medical examination without abnormal findings: Secondary | ICD-10-CM

## 2018-10-02 DIAGNOSIS — Z79899 Other long term (current) drug therapy: Secondary | ICD-10-CM

## 2018-10-02 DIAGNOSIS — E119 Type 2 diabetes mellitus without complications: Secondary | ICD-10-CM | POA: Diagnosis not present

## 2018-10-02 DIAGNOSIS — E1122 Type 2 diabetes mellitus with diabetic chronic kidney disease: Secondary | ICD-10-CM | POA: Diagnosis not present

## 2018-10-02 DIAGNOSIS — Z6826 Body mass index (BMI) 26.0-26.9, adult: Secondary | ICD-10-CM

## 2018-10-02 DIAGNOSIS — Z0001 Encounter for general adult medical examination with abnormal findings: Secondary | ICD-10-CM | POA: Diagnosis not present

## 2018-10-02 DIAGNOSIS — I493 Ventricular premature depolarization: Secondary | ICD-10-CM

## 2018-10-02 DIAGNOSIS — S32020S Wedge compression fracture of second lumbar vertebra, sequela: Secondary | ICD-10-CM

## 2018-10-02 DIAGNOSIS — K4090 Unilateral inguinal hernia, without obstruction or gangrene, not specified as recurrent: Secondary | ICD-10-CM

## 2018-10-02 DIAGNOSIS — E785 Hyperlipidemia, unspecified: Secondary | ICD-10-CM

## 2018-10-02 MED ORDER — ATENOLOL 25 MG PO TABS: 12.5000 mg | ORAL_TABLET | Freq: Every day | ORAL | 1 refills | Status: AC

## 2018-10-02 NOTE — Patient Instructions (Addendum)
Try salonpas over the counter lidocaine patches for your back  Switch to allegra (fexofenadine)    Nathan Davis , Thank you for taking time to come for your Medicare Wellness Visit. I appreciate your ongoing commitment to your health goals. Please review the following plan we discussed and let me know if I can assist you in the future.   These are the goals we discussed: Goals    . LDL CALC < 70       This is a list of the screening recommended for you and due dates:  Health Maintenance  Topic Date Due  . Hemoglobin A1C  12/26/2018  . Eye exam for diabetics  01/31/2019  . Complete foot exam   06/27/2019  . Urine Protein Check  06/27/2019  . Colon Cancer Screening  06/28/2020  . Tetanus Vaccine  08/28/2023  . Flu Shot  Completed  .  Hepatitis C: One time screening is recommended by Center for Disease Control  (CDC) for  adults born from [birth year] through 1966.   Completed  . Pneumonia vaccines  Completed

## 2018-10-03 ENCOUNTER — Other Ambulatory Visit: Payer: Self-pay | Admitting: Adult Health

## 2018-10-03 ENCOUNTER — Encounter: Payer: Self-pay | Admitting: Adult Health

## 2018-10-03 ENCOUNTER — Other Ambulatory Visit: Payer: Self-pay

## 2018-10-03 LAB — COMPLETE METABOLIC PANEL WITH GFR
AG Ratio: 1.5 (calc) (ref 1.0–2.5)
ALT: 23 U/L (ref 9–46)
AST: 23 U/L (ref 10–35)
Albumin: 4 g/dL (ref 3.6–5.1)
Alkaline phosphatase (APISO): 82 U/L (ref 40–115)
BUN/Creatinine Ratio: 21 (calc) (ref 6–22)
BUN: 30 mg/dL — ABNORMAL HIGH (ref 7–25)
CO2: 26 mmol/L (ref 20–32)
Calcium: 10.9 mg/dL — ABNORMAL HIGH (ref 8.6–10.3)
Chloride: 100 mmol/L (ref 98–110)
Creat: 1.45 mg/dL — ABNORMAL HIGH (ref 0.70–1.18)
GFR, Est African American: 55 mL/min/{1.73_m2} — ABNORMAL LOW (ref >=60)
GFR, Est Non African American: 47 mL/min/{1.73_m2} — ABNORMAL LOW (ref >=60)
Globulin: 2.7 g/dL (calc) (ref 1.9–3.7)
Glucose, Bld: 54 mg/dL — ABNORMAL LOW (ref 65–99)
Potassium: 4.9 mmol/L (ref 3.5–5.3)
Sodium: 134 mmol/L — ABNORMAL LOW (ref 135–146)
Total Bilirubin: 0.5 mg/dL (ref 0.2–1.2)
Total Protein: 6.7 g/dL (ref 6.1–8.1)

## 2018-10-03 LAB — CBC WITH DIFFERENTIAL/PLATELET
Absolute Monocytes: 1415 cells/uL — ABNORMAL HIGH (ref 200–950)
Basophils Absolute: 275 cells/uL — ABNORMAL HIGH (ref 0–200)
Basophils Relative: 0.7 %
Eosinophils Absolute: 943 cells/uL — ABNORMAL HIGH (ref 15–500)
Eosinophils Relative: 2.4 %
HCT: 26.2 % — ABNORMAL LOW (ref 38.5–50.0)
Hemoglobin: 8 g/dL — ABNORMAL LOW (ref 13.2–17.1)
Lymphs Abs: 30340 cells/uL — ABNORMAL HIGH (ref 850–3900)
MCH: 22.3 pg — ABNORMAL LOW (ref 27.0–33.0)
MCHC: 30.5 g/dL — ABNORMAL LOW (ref 32.0–36.0)
MCV: 73 fL — ABNORMAL LOW (ref 80.0–100.0)
MPV: 10.9 fL (ref 7.5–12.5)
Monocytes Relative: 3.6 %
Neutro Abs: 6327 cells/uL (ref 1500–7800)
Neutrophils Relative %: 16.1 %
Platelets: 410 10*3/uL — ABNORMAL HIGH (ref 140–400)
RBC: 3.59 10*6/uL — ABNORMAL LOW (ref 4.20–5.80)
RDW: 15.9 % — ABNORMAL HIGH (ref 11.0–15.0)
Total Lymphocyte: 77.2 %
WBC: 39.3 10*3/uL — ABNORMAL HIGH (ref 3.8–10.8)

## 2018-10-03 LAB — LIPID PANEL
Cholesterol: 136 mg/dL (ref <200)
HDL: 24 mg/dL — ABNORMAL LOW (ref >40)
LDL Cholesterol (Calc): 81 mg/dL (calc)
Non-HDL Cholesterol (Calc): 112 mg/dL (calc) (ref <130)
Total CHOL/HDL Ratio: 5.7 (calc) — ABNORMAL HIGH (ref <5.0)
Triglycerides: 217 mg/dL — ABNORMAL HIGH (ref <150)

## 2018-10-03 LAB — HEMOGLOBIN A1C
Hgb A1c MFr Bld: 7.6 % of total Hgb — ABNORMAL HIGH (ref <5.7)
Mean Plasma Glucose: 171 (calc)
eAG (mmol/L): 9.5 (calc)

## 2018-10-03 LAB — MAGNESIUM: Magnesium: 1.5 mg/dL (ref 1.5–2.5)

## 2018-10-03 LAB — TSH: TSH: 1.69 mIU/L (ref 0.40–4.50)

## 2018-10-03 MED ORDER — MAGNESIUM 250 MG PO TABS: 250.0000 mg | ORAL_TABLET | Freq: Two times a day (BID) | ORAL | 1 refills | Status: AC

## 2018-10-03 NOTE — Telephone Encounter (Deleted)
Patient states that he doesn't currently take a magnesium supplement and provider would like his to start this. Patient would like a prescription for this.

## 2018-10-13 ENCOUNTER — Other Ambulatory Visit: Payer: Self-pay | Admitting: Oncology

## 2018-10-13 DIAGNOSIS — C9110 Chronic lymphocytic leukemia of B-cell type not having achieved remission: Secondary | ICD-10-CM

## 2018-10-16 MED FILL — IMBRUVICA 420 MG TAB: 420 | 28 days supply | Qty: 28 | Fill #0

## 2018-10-22 ENCOUNTER — Ambulatory Visit: Payer: Self-pay

## 2018-10-24 ENCOUNTER — Inpatient Hospital Stay: Payer: Medicare Other | Admitting: Oncology

## 2018-10-24 ENCOUNTER — Inpatient Hospital Stay: Payer: Medicare Other

## 2018-10-29 ENCOUNTER — Ambulatory Visit: Payer: Medicare Other

## 2018-10-29 NOTE — Progress Notes (Signed)
Patient presents to the office for a nurse visit to have Parathyroid checked. Patient was suppose to start taking Magnesium, 250mg , BID. States that he has not started taking yet but plans to. No questions or concerns. Vitals taken and recorded.

## 2018-10-30 ENCOUNTER — Other Ambulatory Visit: Payer: Self-pay | Admitting: Adult Health

## 2018-10-30 LAB — PTH, INTACT AND CALCIUM
Calcium: 11 mg/dL — ABNORMAL HIGH (ref 8.6–10.3)
PTH: 11 pg/mL — ABNORMAL LOW (ref 14–64)

## 2018-11-03 NOTE — Addendum Note (Signed)
Addended by: Eulis Canner on: 11/03/2018 11:45 AM   Modules accepted: Orders

## 2018-11-04 LAB — CALCIUM, URINE, 24 HOUR: Calcium, 24H Urine: 176 mg/24 h

## 2018-11-10 ENCOUNTER — Telehealth: Payer: Self-pay | Admitting: *Deleted

## 2018-11-10 NOTE — Telephone Encounter (Signed)
Patient calling to say he is having new left hip pain and is having to use a cane to walk. States he has been taking imbruvica. He has an appt with dr Alen Blew 11/25/18. He would like to have this appt moved up if at all possible.

## 2018-11-21 MED FILL — IMBRUVICA 420 MG TAB: 420 | 28 days supply | Qty: 28 | Fill #1

## 2018-11-25 ENCOUNTER — Inpatient Hospital Stay: Payer: Medicare Other | Attending: Oncology

## 2018-11-25 ENCOUNTER — Telehealth: Payer: Self-pay | Admitting: Oncology

## 2018-11-25 ENCOUNTER — Inpatient Hospital Stay: Payer: Medicare Other | Admitting: Oncology

## 2018-11-25 ENCOUNTER — Telehealth: Payer: Self-pay | Admitting: *Deleted

## 2018-11-25 VITALS — BP 120/56 | HR 98 | Temp 97.6°F | Resp 18 | Ht 65.0 in | Wt 154.0 lb

## 2018-11-25 DIAGNOSIS — R634 Abnormal weight loss: Secondary | ICD-10-CM | POA: Diagnosis not present

## 2018-11-25 DIAGNOSIS — C9110 Chronic lymphocytic leukemia of B-cell type not having achieved remission: Secondary | ICD-10-CM

## 2018-11-25 DIAGNOSIS — M2550 Pain in unspecified joint: Secondary | ICD-10-CM

## 2018-11-25 DIAGNOSIS — R630 Anorexia: Secondary | ICD-10-CM | POA: Insufficient documentation

## 2018-11-25 DIAGNOSIS — D649 Anemia, unspecified: Secondary | ICD-10-CM

## 2018-11-25 DIAGNOSIS — R5383 Other fatigue: Secondary | ICD-10-CM

## 2018-11-25 LAB — CBC WITH DIFFERENTIAL (CANCER CENTER ONLY)
Abs Immature Granulocytes: 1.04 10*3/uL — ABNORMAL HIGH (ref 0.00–0.07)
Basophils Absolute: 0.2 10*3/uL — ABNORMAL HIGH (ref 0.0–0.1)
Basophils Relative: 1 %
Eosinophils Absolute: 0.4 10*3/uL (ref 0.0–0.5)
Eosinophils Relative: 2 %
HCT: 26.4 % — ABNORMAL LOW (ref 39.0–52.0)
Hemoglobin: 8.8 g/dL — ABNORMAL LOW (ref 13.0–17.0)
Immature Granulocytes: 5 %
Lymphocytes Relative: 51 %
Lymphs Abs: 12 10*3/uL — ABNORMAL HIGH (ref 0.7–4.0)
MCH: 22.1 pg — ABNORMAL LOW (ref 26.0–34.0)
MCHC: 33.3 g/dL (ref 30.0–36.0)
MCV: 66.2 fL — ABNORMAL LOW (ref 80.0–100.0)
Monocytes Absolute: 2.2 10*3/uL — ABNORMAL HIGH (ref 0.1–1.0)
Monocytes Relative: 9 %
Neutro Abs: 7.3 10*3/uL (ref 1.7–7.7)
Neutrophils Relative %: 32 %
Platelet Count: 428 10*3/uL — ABNORMAL HIGH (ref 150–400)
RBC: 3.99 MIL/uL — ABNORMAL LOW (ref 4.22–5.81)
RDW: 21.1 % — ABNORMAL HIGH (ref 11.5–15.5)
WBC Count: 23 10*3/uL — ABNORMAL HIGH (ref 4.0–10.5)
nRBC: 0 % (ref 0.0–0.2)

## 2018-11-25 LAB — CMP (CANCER CENTER ONLY)
ALT: 36 U/L (ref 0–44)
AST: 60 U/L — ABNORMAL HIGH (ref 15–41)
Albumin: 2.7 g/dL — ABNORMAL LOW (ref 3.5–5.0)
Alkaline Phosphatase: 206 U/L — ABNORMAL HIGH (ref 38–126)
Anion gap: 9 (ref 5–15)
BUN: 34 mg/dL — ABNORMAL HIGH (ref 8–23)
CO2: 24 mmol/L (ref 22–32)
Calcium: 9.9 mg/dL (ref 8.9–10.3)
Chloride: 101 mmol/L (ref 98–111)
Creatinine: 1.28 mg/dL — ABNORMAL HIGH (ref 0.61–1.24)
GFR, Est AFR Am: >60 mL/min (ref >60)
GFR, Estimated: 55 mL/min — ABNORMAL LOW (ref >60)
Glucose, Bld: 42 mg/dL — CL (ref 70–99)
Potassium: 4.4 mmol/L (ref 3.5–5.1)
Sodium: 134 mmol/L — ABNORMAL LOW (ref 135–145)
Total Bilirubin: 1.8 mg/dL — ABNORMAL HIGH (ref 0.3–1.2)
Total Protein: 6.2 g/dL — ABNORMAL LOW (ref 6.5–8.1)

## 2018-11-25 NOTE — Telephone Encounter (Signed)
Scheduled appt per 3/10 los. ° °Printed calendar and avs. °

## 2018-11-25 NOTE — Telephone Encounter (Signed)
Attempted to reach patient, already left the building. re: 42 blood sugar. LM on his cell phone answering machine to eat something and check his blood sugar again  tonight. Attempted to call home number and no voice mail has been set up. Called cell again and reached patient. Stated he was getting ready to eat now. Per dr Alen Blew, he is to check his BS again tonight. Patient verbalized understanding.

## 2018-11-25 NOTE — Progress Notes (Signed)
Hematology and Oncology Follow Up Visit  Nathan Davis [id number] 08/10/1946 73 y.o. 11/25/2018 3:42 PM [name], MDMcKeown, [name], MD   Principle Diagnosis: 73 year old man with CLL diagnosed in 2017 after presenting with a lymphadenopathy and leukocytosis.     Current therapy: Imbruvica 420 mg daily started in August 2019.   Interim History:  Nathan Davis is here for a follow-up.  Since the last visit, he has reported some decline in his overall performance status and activity level.  He has reported number of complaints and symptoms that are attributed to improve it.  He has reported increased fatigue, joint pain and weight loss.  He denies any bleeding or clotting tendency.  He denies any fevers, chills or sweats.  He denies any cough or wheezing.  His mobility has been more limited in the last 4 weeks.   Patient denied any alteration mental status, neuropathy, confusion or dizziness.  Denies any headaches or lethargy.  Denies any night sweats, weight loss.  Denied orthopnea, dyspnea on exertion or chest discomfort.  Denies shortness of breath, difficulty breathing hemoptysis or cough.  Denies any abdominal distention, nausea, early satiety or dyspepsia.  Denies any hematuria, frequency, dysuria or nocturia.  Denies any skin irritation, dryness or rash.  Denies any ecchymosis or petechiae.  Denies any lymphadenopathy or clotting.  Denies any heat or cold intolerance.  Denies any anxiety or depression.  Remaining review of system is negative.  .      Medications: I have reviewed the patient's current medications.  Current Outpatient Medications  Medication Sig Dispense Refill  . acetaminophen (TYLENOL) 500 MG tablet Take 1,000 mg by mouth every 6 (six) hours as needed for moderate pain or headache.    Marland Kitchen aspirin 81 MG tablet Take 81 mg by mouth daily.    Marland Kitchen atenolol (TENORMIN) 25 MG tablet Take 0.5 tablets (12.5 mg total) by mouth at bedtime. 90 tablet 1  . atorvastatin  (LIPITOR) 80 MG tablet TAKE 1 TABLET A DAY 90 tablet 1  . Cholecalciferol (VITAMIN D3) 1000 units CAPS Take 2,000 Units by mouth daily.    . IMBRUVICA 420 MG TABS TAKE 1 TABLET (420 MG) BY MOUTH DAILY. TAKE WITH A GLASS OF WATER AT APPROX THE SAME TIME EACH DAY. 28 tablet 1  . Insulin Pen Needle (B-D ULTRAFINE III SHORT PEN) 31G X 8 MM MISC USE DAILY WITH LEVEMIR. DX-E11.29. 100 each 2  . LEVEMIR FLEXTOUCH 100 UNIT/ML Pen INJECT 30 UNITS INTO THE SKIN DAILY 15 pen 3  . loratadine (CLARITIN) 10 MG tablet Take 1 tablet daily for Allergies (Patient taking differently: Take 10 mg by mouth daily as needed for allergies. ) 360 tablet 1  . Magnesium 250 MG TABS Take 1 tablet (250 mg total) by mouth 2 (two) times daily with a meal. 180 tablet 1  . metFORMIN (GLUCOPHAGE) 1000 MG tablet TAKE ONE TABLET BY MOUTH TWICE A DAY 180 tablet 0  . mirtazapine (REMERON) 30 MG tablet Take 1/2 to 1 tablet 1 hour before bedtime  for appetite & sleep 30 tablet 2  . Tetrahydrozoline HCl (VISINE OP) Place 2 drops into both eyes daily as needed (for dry eyes).     No current facility-administered medications for this visit.      Allergies:  Allergies  Allergen Reactions  . Amaryl [Glimepiride] Diarrhea  . Codeine Itching  . Pioglitazone Diarrhea, Itching and Anxiety    Past Medical History, Surgical history, Social history, and Family History were reviewed and  updated.   Physical Exam:   Blood pressure (!) 120/56, pulse 98, temperature 97.6 F (36.4 C), temperature source Oral, resp. rate 18, height 5\' 5"  (1.651 m), weight 154 lb (69.9 kg), SpO2 98 %.     ECOG: 2   General appearance: Comfortable appearing without any discomfort.  Chronically ill-appearing. Head: Normocephalic without any trauma Oropharynx: Mucous membranes are moist and pink without any thrush or ulcers. Eyes: Pupils are equal and round reactive to light. Lymph nodes: No cervical, supraclavicular, inguinal or axillary  lymphadenopathy.   Heart:regular rate and rhythm.  S1 and S2 without leg edema. Lung: Clear without any rhonchi or wheezes.  No dullness to percussion. Abdomin: Soft, nontender, nondistended with good bowel sounds.  No hepatosplenomegaly. Musculoskeletal: No joint deformity or effusion.  Full range of motion noted. Neurological: No deficits noted on motor, sensory and deep tendon reflex exam. Skin: No petechial rash or dryness.  Appeared moist.       Lab Results: Lab Results  Component Value Date   WBC 23.0 (H) 11/25/2018   HGB 8.8 (L) 11/25/2018   HCT 26.4 (L) 11/25/2018   MCV 66.2 (L) 11/25/2018   PLT 428 (H) 11/25/2018     Chemistry      Component Value Date/Time   NA 134 (L) 10/02/2018 0858   NA 139 06/26/2017 0813   K 4.9 10/02/2018 0858   K 4.8 06/26/2017 0813   CL 100 10/02/2018 0858   CO2 26 10/02/2018 0858   CO2 28 06/26/2017 0813   BUN 30 (H) 10/02/2018 0858   BUN 15.8 06/26/2017 0813   CREATININE 1.45 (H) 10/02/2018 0858   CREATININE 1.1 06/26/2017 0813      Component Value Date/Time   CALCIUM 11.0 (H) 10/29/2018 0918   CALCIUM 9.6 06/26/2017 0813   ALKPHOS 72 09/09/2018 0935   ALKPHOS 66 06/26/2017 0813   AST 23 10/02/2018 0858   AST 40 09/09/2018 0935   AST 20 06/26/2017 0813   ALT 23 10/02/2018 0858   ALT 42 09/09/2018 0935   ALT 13 06/26/2017 0813   BILITOT 0.5 10/02/2018 0858   BILITOT 0.5 09/09/2018 0935   BILITOT 0.65 06/26/2017 0813        Impression and Plan:   73 year old man with the:  1.  CLL diagnosed in 2017 and has been on Imbruvica since 2019.     The natural course of this disease was discussed today again with the patient and his wife.  His laboratory data reviewed today and continues to show improvement in his lymphocytosis, hemoglobin and overall hematological parameters.  However, he is complaining of multitude of symptoms likely related to this medication.  Options of how to proceed were reviewed today which include  stopping his Imbruvica temporarily and potentially restarting it at a lower dose or changing his therapy altogether.  After discussion today, we have opted to hold improving at this time and monitor his clinical status closely.  Different salvage therapy or reducing the dose may be a possibility in the future.  2.  Anemia: Related to saline his hemoglobin has improved.   3. Autoimmune considerations: No indication for any autoimmune issues noted.  4. Infectious considerations: Denies any recent fevers or opportunistic infections.  5.  Anorexia: We discussed strategies to improve his nutritional intake including nutritional supplements.  6.  Tumor lysis syndrome: No evidence to suggest tumor lysis syndrome at this time.  Electrolytes continues to be monitored periodically  7. Follow-up: Will be in 4 4 weeks  for repeat evaluation.  25  minutes was spent with the patient face-to-face today.  More than 50% of time was spent on discussing laboratory data, management options, answering questions regarding future plan of care.  Zola Button, MD 3/10/20203:42 PM

## 2018-12-06 ENCOUNTER — Encounter (HOSPITAL_COMMUNITY): Payer: Self-pay

## 2018-12-06 ENCOUNTER — Other Ambulatory Visit: Payer: Self-pay

## 2018-12-06 ENCOUNTER — Inpatient Hospital Stay (HOSPITAL_COMMUNITY)
Admission: EM | Admit: 2018-12-06 | Discharge: 2018-12-17 | DRG: 682 | Disposition: E | Payer: Medicare Other | Attending: Internal Medicine | Admitting: Internal Medicine

## 2018-12-06 ENCOUNTER — Emergency Department (HOSPITAL_COMMUNITY): Payer: Medicare Other

## 2018-12-06 DIAGNOSIS — E875 Hyperkalemia: Secondary | ICD-10-CM | POA: Diagnosis present

## 2018-12-06 DIAGNOSIS — R4182 Altered mental status, unspecified: Secondary | ICD-10-CM | POA: Diagnosis not present

## 2018-12-06 DIAGNOSIS — I959 Hypotension, unspecified: Secondary | ICD-10-CM | POA: Diagnosis not present

## 2018-12-06 DIAGNOSIS — N183 Chronic kidney disease, stage 3 unspecified: Secondary | ICD-10-CM

## 2018-12-06 DIAGNOSIS — Z820 Family history of epilepsy and other diseases of the nervous system: Secondary | ICD-10-CM

## 2018-12-06 DIAGNOSIS — E1165 Type 2 diabetes mellitus with hyperglycemia: Secondary | ICD-10-CM | POA: Diagnosis present

## 2018-12-06 DIAGNOSIS — Z808 Family history of malignant neoplasm of other organs or systems: Secondary | ICD-10-CM

## 2018-12-06 DIAGNOSIS — R0989 Other specified symptoms and signs involving the circulatory and respiratory systems: Secondary | ICD-10-CM

## 2018-12-06 DIAGNOSIS — Z8719 Personal history of other diseases of the digestive system: Secondary | ICD-10-CM

## 2018-12-06 DIAGNOSIS — E559 Vitamin D deficiency, unspecified: Secondary | ICD-10-CM | POA: Diagnosis present

## 2018-12-06 DIAGNOSIS — Z6823 Body mass index (BMI) 23.0-23.9, adult: Secondary | ICD-10-CM

## 2018-12-06 DIAGNOSIS — Z7982 Long term (current) use of aspirin: Secondary | ICD-10-CM

## 2018-12-06 DIAGNOSIS — R945 Abnormal results of liver function studies: Secondary | ICD-10-CM | POA: Diagnosis present

## 2018-12-06 DIAGNOSIS — K7200 Acute and subacute hepatic failure without coma: Secondary | ICD-10-CM | POA: Diagnosis present

## 2018-12-06 DIAGNOSIS — I4891 Unspecified atrial fibrillation: Secondary | ICD-10-CM | POA: Diagnosis present

## 2018-12-06 DIAGNOSIS — S32000A Wedge compression fracture of unspecified lumbar vertebra, initial encounter for closed fracture: Secondary | ICD-10-CM | POA: Diagnosis present

## 2018-12-06 DIAGNOSIS — N179 Acute kidney failure, unspecified: Secondary | ICD-10-CM | POA: Diagnosis not present

## 2018-12-06 DIAGNOSIS — Z885 Allergy status to narcotic agent status: Secondary | ICD-10-CM

## 2018-12-06 DIAGNOSIS — I4901 Ventricular fibrillation: Secondary | ICD-10-CM | POA: Diagnosis not present

## 2018-12-06 DIAGNOSIS — Z8249 Family history of ischemic heart disease and other diseases of the circulatory system: Secondary | ICD-10-CM

## 2018-12-06 DIAGNOSIS — I468 Cardiac arrest due to other underlying condition: Secondary | ICD-10-CM | POA: Diagnosis not present

## 2018-12-06 DIAGNOSIS — E11649 Type 2 diabetes mellitus with hypoglycemia without coma: Secondary | ICD-10-CM | POA: Diagnosis not present

## 2018-12-06 DIAGNOSIS — X58XXXA Exposure to other specified factors, initial encounter: Secondary | ICD-10-CM | POA: Diagnosis present

## 2018-12-06 DIAGNOSIS — D509 Iron deficiency anemia, unspecified: Secondary | ICD-10-CM | POA: Diagnosis present

## 2018-12-06 DIAGNOSIS — M4856XA Collapsed vertebra, not elsewhere classified, lumbar region, initial encounter for fracture: Secondary | ICD-10-CM | POA: Diagnosis present

## 2018-12-06 DIAGNOSIS — E860 Dehydration: Secondary | ICD-10-CM | POA: Diagnosis present

## 2018-12-06 DIAGNOSIS — E871 Hypo-osmolality and hyponatremia: Secondary | ICD-10-CM | POA: Diagnosis present

## 2018-12-06 DIAGNOSIS — H269 Unspecified cataract: Secondary | ICD-10-CM | POA: Diagnosis present

## 2018-12-06 DIAGNOSIS — E1122 Type 2 diabetes mellitus with diabetic chronic kidney disease: Secondary | ICD-10-CM

## 2018-12-06 DIAGNOSIS — I129 Hypertensive chronic kidney disease with stage 1 through stage 4 chronic kidney disease, or unspecified chronic kidney disease: Secondary | ICD-10-CM | POA: Diagnosis present

## 2018-12-06 DIAGNOSIS — E440 Moderate protein-calorie malnutrition: Secondary | ICD-10-CM

## 2018-12-06 DIAGNOSIS — Z79899 Other long term (current) drug therapy: Secondary | ICD-10-CM

## 2018-12-06 DIAGNOSIS — E119 Type 2 diabetes mellitus without complications: Secondary | ICD-10-CM

## 2018-12-06 DIAGNOSIS — R627 Adult failure to thrive: Secondary | ICD-10-CM | POA: Diagnosis present

## 2018-12-06 DIAGNOSIS — Z888 Allergy status to other drugs, medicaments and biological substances status: Secondary | ICD-10-CM

## 2018-12-06 DIAGNOSIS — N400 Enlarged prostate without lower urinary tract symptoms: Secondary | ICD-10-CM | POA: Diagnosis present

## 2018-12-06 DIAGNOSIS — Z794 Long term (current) use of insulin: Secondary | ICD-10-CM

## 2018-12-06 DIAGNOSIS — R188 Other ascites: Secondary | ICD-10-CM | POA: Diagnosis present

## 2018-12-06 DIAGNOSIS — C9110 Chronic lymphocytic leukemia of B-cell type not having achieved remission: Secondary | ICD-10-CM | POA: Diagnosis present

## 2018-12-06 DIAGNOSIS — E785 Hyperlipidemia, unspecified: Secondary | ICD-10-CM | POA: Diagnosis present

## 2018-12-06 LAB — CBC
HCT: 26.8 % — ABNORMAL LOW (ref 39.0–52.0)
Hemoglobin: 9.3 g/dL — ABNORMAL LOW (ref 13.0–17.0)
MCH: 22.5 pg — ABNORMAL LOW (ref 26.0–34.0)
MCHC: 34.7 g/dL (ref 30.0–36.0)
MCV: 64.7 fL — ABNORMAL LOW (ref 80.0–100.0)
Platelets: 393 10*3/uL (ref 150–400)
RBC: 4.14 MIL/uL — ABNORMAL LOW (ref 4.22–5.81)
RDW: 22.5 % — ABNORMAL HIGH (ref 11.5–15.5)
WBC: 20.6 10*3/uL — ABNORMAL HIGH (ref 4.0–10.5)
nRBC: 0 % (ref 0.0–0.2)

## 2018-12-06 LAB — COMPREHENSIVE METABOLIC PANEL
ALT: 82 U/L — ABNORMAL HIGH (ref 0–44)
AST: 136 U/L — ABNORMAL HIGH (ref 15–41)
Albumin: 1.8 g/dL — ABNORMAL LOW (ref 3.5–5.0)
Alkaline Phosphatase: 335 U/L — ABNORMAL HIGH (ref 38–126)
Anion gap: 11 (ref 5–15)
BUN: 64 mg/dL — ABNORMAL HIGH (ref 8–23)
CO2: 19 mmol/L — ABNORMAL LOW (ref 22–32)
Calcium: 10 mg/dL (ref 8.9–10.3)
Chloride: 103 mmol/L (ref 98–111)
Creatinine, Ser: 2.51 mg/dL — ABNORMAL HIGH (ref 0.61–1.24)
GFR calc Af Amer: 28 mL/min — ABNORMAL LOW (ref >60)
GFR calc non Af Amer: 24 mL/min — ABNORMAL LOW (ref >60)
Glucose, Bld: 287 mg/dL — ABNORMAL HIGH (ref 70–99)
Potassium: 5.4 mmol/L — ABNORMAL HIGH (ref 3.5–5.1)
Sodium: 133 mmol/L — ABNORMAL LOW (ref 135–145)
Total Bilirubin: 4 mg/dL — ABNORMAL HIGH (ref 0.3–1.2)
Total Protein: 4.8 g/dL — ABNORMAL LOW (ref 6.5–8.1)

## 2018-12-06 LAB — INFLUENZA PANEL BY PCR (TYPE A & B)
Influenza A By PCR: NEGATIVE
Influenza B By PCR: NEGATIVE

## 2018-12-06 LAB — LIPASE, BLOOD: Lipase: 38 U/L (ref 11–51)

## 2018-12-06 MED ORDER — SODIUM CHLORIDE 0.9 % IV BOLUS
1000.0000 mL | Freq: Once | INTRAVENOUS | Status: AC
  Administered 2018-12-06: 1000 mL via INTRAVENOUS

## 2018-12-06 NOTE — ED Notes (Signed)
Patient transported to X-ray 

## 2018-12-06 NOTE — ED Provider Notes (Signed)
[facility] EMERGENCY DEPARTMENT Provider Note   CSN: [id number] Arrival date & time: 11/18/2018  2134    History   Chief Complaint Chief Complaint  Patient presents with  . Anorexia    HPI Nathan Davis is a 73 y.o. male.     HPI  Patient is a 73 year old male with CLL diagnosed in 2017 who presents to the emergency department with complaints of generalized anorexia over the past several days.  No nausea vomiting or diarrhea.  Patient and family report generalized weakness.  No melena.  No fevers.  No myalgias.  No recent sick contacts.  No recent travel.  Some cough.  No shortness of breath.   Past Medical History:  Diagnosis Date  . Cataract   . CLL (chronic lymphocytic leukemia) ([place])    not treated yet  . Colon polyps   . Diabetes ([place]) 1992   type 2  . ED (erectile dysfunction)   . Femur fracture ([facility])    left  . Heart murmur   . History of hiatal hernia   . Iron (Fe) deficiency anemia   . Obesity   . Other and unspecified hyperlipidemia   . Premature beats, unspecified   . Unspecified essential hypertension   . Vitamin D deficiency     Patient Active Problem List   Diagnosis Date Noted  . Hypercalcemia 10/03/2018  . CKD stage 3 due to type 2 diabetes mellitus ([place]) 06/26/2018  . BPH with obstruction/lower urinary tract symptoms 06/26/2018  . Left inguinal hernia 01/22/2018  . Lumbar compression fracture ([place]) 12/03/2017  . Insulin-requiring or dependent type II diabetes mellitus ([place]) 05/26/2017  . CLL (chronic lymphocytic leukemia) ([place]) 10/09/2016  . Hyperlipidemia associated with type 2 diabetes mellitus ([place]) 02/23/2016  . PVC (premature ventricular contraction) 02/04/2014  . Essential hypertension 12/09/2013  . Vitamin D deficiency 12/09/2013  . Medication management 12/09/2013  . Type 2 diabetes mellitus with stage 3 chronic kidney disease, with long-term current use of insulin ([place]) 01/04/2010    Past Surgical History:   Procedure Laterality Date  . ETT - MYOVIEW     7'45", stopped due to fatigue, no chest pain. EF55%, no ischemia or infarction  . HERNIA REPAIR     hiatal hernia  1994 , inguinal scheduled 01-02-18 with dr. Coralie Keens  . INGUINAL HERNIA REPAIR Bilateral 02/06/2018   Procedure: LAPAROSCOPIC BILATERAL INGUINAL HERNIA REPAIR;  Surgeon: Coralie Keens, MD;  Location: [facility];  Service: General;  Laterality: Bilateral;  . INSERTION OF MESH Bilateral 02/06/2018   Procedure: INSERTION OF MESH;  Surgeon: Coralie Keens, MD;  Location: [facility];  Service: General;  Laterality: Bilateral;  . nissan fundoplication N/A 76/72/0947        Home Medications    Prior to Admission medications   Medication Sig Start Date End Date Taking? Authorizing Provider  acetaminophen (TYLENOL) 500 MG tablet Take 1,000 mg by mouth every 6 (six) hours as needed for moderate pain or headache.    [provider]  aspirin 81 MG tablet Take 81 mg by mouth daily.    [provider]  atenolol (TENORMIN) 25 MG tablet Take 0.5 tablets (12.5 mg total) by mouth at bedtime. 10/02/18   Liane Comber, NP  atorvastatin (LIPITOR) 80 MG tablet TAKE 1 TABLET A DAY Patient taking differently: Take 80 mg by mouth.  07/07/18   [name], MD  Cholecalciferol (VITAMIN D3) 1000 units CAPS Take 2,000 Units by mouth daily.  [provider]  IMBRUVICA 420 MG TABS TAKE 1 TABLET (420 MG) BY MOUTH DAILY. TAKE WITH A GLASS OF WATER AT APPROX THE SAME TIME EACH DAY. Patient taking differently: Take 420 mg by mouth daily. TAKE WITH A GLASS OF WATER AT APPROX THE SAME TIME EACH DAY 10/13/18   Wyatt Portela, MD  Insulin Pen Needle (B-D ULTRAFINE III SHORT PEN) 31G X 8 MM MISC USE DAILY WITH LEVEMIR. DX-E11.29. 05/28/18   [name], MD  LEVEMIR FLEXTOUCH 100 UNIT/ML Pen INJECT 30 UNITS INTO THE SKIN DAILY Patient taking differently: Inject 30 Units into the skin.   05/16/18   [name], MD  loratadine (CLARITIN) 10 MG tablet Take 1 tablet daily for Allergies Patient taking differently: Take 10 mg by mouth daily as needed for allergies.  09/18/17   Vicie Mutters, PA-C  Magnesium 250 MG TABS Take 1 tablet (250 mg total) by mouth 2 (two) times daily with a meal. 10/03/18   Liane Comber, NP  metFORMIN (GLUCOPHAGE) 1000 MG tablet TAKE ONE TABLET BY MOUTH TWICE A DAY Patient taking differently: Take 1,000 mg by mouth 2 (two) times daily with a meal.  07/14/18   [name], MD  mirtazapine (REMERON) 30 MG tablet Take 1/2 to 1 tablet 1 hour before bedtime  for appetite & sleep Patient taking differently: Take 15-30 mg by mouth See admin instructions. Take 15-30 mg by mouth one hour before bedtime for appetite and sleep 03/25/18   [name], MD  Tetrahydrozoline HCl (VISINE OP) Place 2 drops into both eyes daily as needed (for dryness).     [provider]    Family History Family History  Problem Relation Age of Onset  . Throat cancer Mother   . Alzheimer's disease Father   . Hypertension Other        family history  . Heart attack Other 72    Social History Social History   Tobacco Use  . Smoking status: Never Smoker  . Smokeless tobacco: Never Used  Substance Use Topics  . Alcohol use: No  . Drug use: No     Allergies   Amaryl [glimepiride]; Codeine; and Pioglitazone   Review of Systems Review of Systems  All other systems reviewed and are negative.    Physical Exam Updated Vital Signs BP 116/66 (BP Location: Right Arm)   Pulse 99   Resp (!) 28   Ht 5\' 8"  (1.727 m)   Wt 69.9 kg   SpO2 96%   BMI 23.42 kg/m   Physical Exam Vitals signs and nursing note reviewed.  Constitutional:      Appearance: He is well-developed.  HENT:     Head: Normocephalic and atraumatic.  Neck:     Musculoskeletal: Normal range of motion.  Cardiovascular:     Rate and Rhythm: Normal rate and regular rhythm.     Heart  sounds: Normal heart sounds.  Pulmonary:     Effort: Pulmonary effort is normal. No respiratory distress.     Breath sounds: Normal breath sounds.  Abdominal:     General: There is no distension.     Palpations: Abdomen is soft.     Tenderness: There is no abdominal tenderness.  Musculoskeletal: Normal range of motion.  Skin:    General: Skin is warm and dry.  Neurological:     Mental Status: He is alert and oriented to person, place, and time.  Psychiatric:        Judgment: Judgment normal.  ED Treatments / Results  Labs (all labs ordered are listed, but only abnormal results are displayed) Labs Reviewed  CBC - Abnormal; Notable for the following components:      Result Value   WBC 20.6 (*)    RBC 4.14 (*)    Hemoglobin 9.3 (*)    HCT 26.8 (*)    MCV 64.7 (*)    MCH 22.5 (*)    RDW 22.5 (*)    All other components within normal limits  COMPREHENSIVE METABOLIC PANEL - Abnormal; Notable for the following components:   Sodium 133 (*)    Potassium 5.4 (*)    CO2 19 (*)    Glucose, Bld 287 (*)    BUN 64 (*)    Creatinine, Ser 2.51 (*)    Total Protein 4.8 (*)    Albumin 1.8 (*)    AST 136 (*)    ALT 82 (*)    Alkaline Phosphatase 335 (*)    Total Bilirubin 4.0 (*)    GFR calc non Af Amer 24 (*)    GFR calc Af Amer 28 (*)    All other components within normal limits  INFLUENZA PANEL BY PCR (TYPE A & B)  URINALYSIS, ROUTINE W REFLEX MICROSCOPIC   WBC  Date Value Ref Range Status  12/05/2018 20.6 (H) 4.0 - 10.5 K/uL Final  10/02/2018 39.3 (H) 3.8 - 10.8 Thousand/uL Final  03/25/2018 121.9 (H) 3.8 - 10.8 Thousand/uL Final  02/06/2018 86.5 (HH) 4.0 - 10.5 K/uL Final    Comment:    REPEATED TO VERIFY CRITICAL RESULT CALLED TO, READ BACK BY AND VERIFIED WITH: CONRAD,J RN @ 1001 02/06/18 LEONARD,A    WBC Count  Date Value Ref Range Status  11/25/2018 23.0 (H) 4.0 - 10.5 K/uL Final  09/09/2018 55.4 (HH) 4.0 - 10.5 K/uL Final    Comment:    This critical  result has verified and been called to [facility] by Dorian Furnace on 12 24 2019 at 0947, and has been read back.  This critical result has verified and been called to [facility] by Dorian Furnace on 12 24 2019 at 0951, and has been read back.    08/01/2018 97.3 (HH) 4.0 - 10.5 K/uL Final    Comment:    This critical result has verified and been called to [facility] by Dorian Furnace on 11 15 2019 at 1303, and has been read back.   06/25/2018 166.5 (HH) 4.0 - 10.5 K/uL Final    Comment:    This result has been called to Melynda Keller by Orvis Brill on 10 09 2019 at 51, and has been read back. called to Melynda Keller at [phone].RB    BUN  Date Value Ref Range Status  12/02/2018 64 (H) 8 - 23 mg/dL Final  11/25/2018 34 (H) 8 - 23 mg/dL Final  10/02/2018 30 (H) 7 - 25 mg/dL Final  09/09/2018 22 8 - 23 mg/dL Final  06/26/2017 15.8 7.0 - 26.0 mg/dL Final  12/26/2016 20.4 7.0 - 26.0 mg/dL Final  06/27/2016 18.7 7.0 - 26.0 mg/dL Final  12/23/2015 13.9 7.0 - 26.0 mg/dL Final   Creatinine  Date Value Ref Range Status  11/25/2018 1.28 (H) 0.61 - 1.24 mg/dL Final  09/09/2018 1.38 (H) 0.61 - 1.24 mg/dL Final  08/01/2018 1.54 (H) 0.61 - 1.24 mg/dL Final  06/25/2018 1.26 (H) 0.61 - 1.24 mg/dL Final  06/26/2017 1.1 0.7 - 1.3 mg/dL Final  12/26/2016 1.2 0.7 - 1.3 mg/dL Final  06/27/2016 1.2 0.7 - 1.3 mg/dL Final  12/23/2015 1.0 0.7 - 1.3 mg/dL Final   Creat  Date Value Ref Range Status  10/02/2018 1.45 (H) 0.70 - 1.18 mg/dL Final    Comment:    For patients >38 years of age, the reference limit for Creatinine is approximately 13% higher for people identified as African-American. Marland Kitchen   03/25/2018 1.28 (H) 0.70 - 1.18 mg/dL Final    Comment:    For patients >59 years of age, the reference limit for Creatinine is approximately 13% higher for people identified as African-American. Marland Kitchen   01/22/2018 1.16 0.70 - 1.18 mg/dL Final    Comment:    For patients >42 years of age, the  reference limit for Creatinine is approximately 13% higher for people identified as African-American. Marland Kitchen   10/16/2017 1.15 0.70 - 1.18 mg/dL Final    Comment:    For patients >59 years of age, the reference limit for Creatinine is approximately 13% higher for people identified as African-American. .    Creatinine, Ser  Date Value Ref Range Status  11/27/2018 2.51 (H) 0.61 - 1.24 mg/dL Final      EKG None  Radiology No results found.  Procedures Procedures (including critical care time)  Medications Ordered in ED Medications  sodium chloride 0.9 % bolus 1,000 mL (has no administration in time range)     Initial Impression / Assessment and Plan / ED Course  I have reviewed the triage vital signs and the nursing notes.  Pertinent labs & imaging results that were available during my care of the patient were reviewed by me and considered in my medical decision making (see chart for details).       Patient with new acute kidney injury likely from dehydration and anorexia.  Reading the chart it sounds as though there is been decline over the past 4 weeks.  Bilirubin and LFTs are trending up.  CT abdomen pelvis pending at this time.  Will likely benefit from admission for IV hydration and recheck of his renal function.  11:05 PM Care to Dr Kathrynn Humble  Final Clinical Impressions(s) / ED Diagnoses   Final diagnoses:  None    ED Discharge Orders    None       Jola Schmidt, MD 11/30/2018 [phone]

## 2018-12-06 NOTE — ED Triage Notes (Signed)
Pt c/o loss of appetite, feeling dehydrated, weak.

## 2018-12-07 ENCOUNTER — Emergency Department (HOSPITAL_COMMUNITY): Payer: Medicare Other

## 2018-12-07 ENCOUNTER — Encounter (HOSPITAL_COMMUNITY): Payer: Self-pay | Admitting: General Practice

## 2018-12-07 DIAGNOSIS — Z794 Long term (current) use of insulin: Secondary | ICD-10-CM

## 2018-12-07 DIAGNOSIS — E785 Hyperlipidemia, unspecified: Secondary | ICD-10-CM | POA: Diagnosis present

## 2018-12-07 DIAGNOSIS — D509 Iron deficiency anemia, unspecified: Secondary | ICD-10-CM | POA: Diagnosis present

## 2018-12-07 DIAGNOSIS — Z820 Family history of epilepsy and other diseases of the nervous system: Secondary | ICD-10-CM | POA: Diagnosis not present

## 2018-12-07 DIAGNOSIS — Z7982 Long term (current) use of aspirin: Secondary | ICD-10-CM | POA: Diagnosis not present

## 2018-12-07 DIAGNOSIS — E119 Type 2 diabetes mellitus without complications: Secondary | ICD-10-CM | POA: Diagnosis not present

## 2018-12-07 DIAGNOSIS — I5022 Chronic systolic (congestive) heart failure: Secondary | ICD-10-CM | POA: Diagnosis not present

## 2018-12-07 DIAGNOSIS — E1122 Type 2 diabetes mellitus with diabetic chronic kidney disease: Secondary | ICD-10-CM

## 2018-12-07 DIAGNOSIS — C9110 Chronic lymphocytic leukemia of B-cell type not having achieved remission: Secondary | ICD-10-CM | POA: Diagnosis present

## 2018-12-07 DIAGNOSIS — M810 Age-related osteoporosis without current pathological fracture: Secondary | ICD-10-CM | POA: Insufficient documentation

## 2018-12-07 DIAGNOSIS — I4901 Ventricular fibrillation: Secondary | ICD-10-CM | POA: Diagnosis not present

## 2018-12-07 DIAGNOSIS — E1165 Type 2 diabetes mellitus with hyperglycemia: Secondary | ICD-10-CM | POA: Diagnosis present

## 2018-12-07 DIAGNOSIS — E440 Moderate protein-calorie malnutrition: Secondary | ICD-10-CM | POA: Diagnosis present

## 2018-12-07 DIAGNOSIS — Z885 Allergy status to narcotic agent status: Secondary | ICD-10-CM | POA: Diagnosis not present

## 2018-12-07 DIAGNOSIS — E871 Hypo-osmolality and hyponatremia: Secondary | ICD-10-CM | POA: Diagnosis present

## 2018-12-07 DIAGNOSIS — K7200 Acute and subacute hepatic failure without coma: Secondary | ICD-10-CM | POA: Diagnosis present

## 2018-12-07 DIAGNOSIS — I468 Cardiac arrest due to other underlying condition: Secondary | ICD-10-CM | POA: Diagnosis not present

## 2018-12-07 DIAGNOSIS — N179 Acute kidney failure, unspecified: Secondary | ICD-10-CM | POA: Diagnosis present

## 2018-12-07 DIAGNOSIS — R188 Other ascites: Secondary | ICD-10-CM | POA: Diagnosis present

## 2018-12-07 DIAGNOSIS — Z8719 Personal history of other diseases of the digestive system: Secondary | ICD-10-CM | POA: Diagnosis not present

## 2018-12-07 DIAGNOSIS — N183 Chronic kidney disease, stage 3 (moderate): Secondary | ICD-10-CM | POA: Diagnosis present

## 2018-12-07 DIAGNOSIS — Z8249 Family history of ischemic heart disease and other diseases of the circulatory system: Secondary | ICD-10-CM | POA: Diagnosis not present

## 2018-12-07 DIAGNOSIS — Z808 Family history of malignant neoplasm of other organs or systems: Secondary | ICD-10-CM | POA: Diagnosis not present

## 2018-12-07 DIAGNOSIS — Z79899 Other long term (current) drug therapy: Secondary | ICD-10-CM | POA: Diagnosis not present

## 2018-12-07 DIAGNOSIS — Z888 Allergy status to other drugs, medicaments and biological substances status: Secondary | ICD-10-CM | POA: Diagnosis not present

## 2018-12-07 DIAGNOSIS — X58XXXA Exposure to other specified factors, initial encounter: Secondary | ICD-10-CM | POA: Diagnosis present

## 2018-12-07 DIAGNOSIS — I129 Hypertensive chronic kidney disease with stage 1 through stage 4 chronic kidney disease, or unspecified chronic kidney disease: Secondary | ICD-10-CM | POA: Diagnosis present

## 2018-12-07 DIAGNOSIS — M4856XA Collapsed vertebra, not elsewhere classified, lumbar region, initial encounter for fracture: Secondary | ICD-10-CM | POA: Diagnosis present

## 2018-12-07 LAB — COMPREHENSIVE METABOLIC PANEL
ALT: 81 U/L — ABNORMAL HIGH (ref 0–44)
AST: 130 U/L — ABNORMAL HIGH (ref 15–41)
Albumin: 1.7 g/dL — ABNORMAL LOW (ref 3.5–5.0)
Alkaline Phosphatase: 320 U/L — ABNORMAL HIGH (ref 38–126)
Anion gap: 8 (ref 5–15)
BUN: 63 mg/dL — ABNORMAL HIGH (ref 8–23)
CO2: 20 mmol/L — ABNORMAL LOW (ref 22–32)
Calcium: 9.4 mg/dL (ref 8.9–10.3)
Chloride: 101 mmol/L (ref 98–111)
Creatinine, Ser: 2.42 mg/dL — ABNORMAL HIGH (ref 0.61–1.24)
GFR calc Af Amer: 30 mL/min — ABNORMAL LOW (ref >60)
GFR calc non Af Amer: 26 mL/min — ABNORMAL LOW (ref >60)
Glucose, Bld: 241 mg/dL — ABNORMAL HIGH (ref 70–99)
Potassium: 4.9 mmol/L (ref 3.5–5.1)
Sodium: 129 mmol/L — ABNORMAL LOW (ref 135–145)
Total Bilirubin: 4.2 mg/dL — ABNORMAL HIGH (ref 0.3–1.2)
Total Protein: 4.6 g/dL — ABNORMAL LOW (ref 6.5–8.1)

## 2018-12-07 LAB — CBC
HCT: 26.6 % — ABNORMAL LOW (ref 39.0–52.0)
Hemoglobin: 8.8 g/dL — ABNORMAL LOW (ref 13.0–17.0)
MCH: 21.6 pg — ABNORMAL LOW (ref 26.0–34.0)
MCHC: 33.1 g/dL (ref 30.0–36.0)
MCV: 65.2 fL — ABNORMAL LOW (ref 80.0–100.0)
Platelets: 390 10*3/uL (ref 150–400)
RBC: 4.08 MIL/uL — ABNORMAL LOW (ref 4.22–5.81)
RDW: 22.4 % — ABNORMAL HIGH (ref 11.5–15.5)
WBC: 20.4 10*3/uL — ABNORMAL HIGH (ref 4.0–10.5)
nRBC: 0 % (ref 0.0–0.2)

## 2018-12-07 LAB — LACTATE DEHYDROGENASE: LDH: 335 U/L — ABNORMAL HIGH (ref 98–192)

## 2018-12-07 LAB — LIPID PANEL
Cholesterol: 146 mg/dL (ref 0–200)
HDL: 10 mg/dL — ABNORMAL LOW (ref >40)
LDL Cholesterol: 94 mg/dL (ref 0–99)
Total CHOL/HDL Ratio: 14.6 RATIO
Triglycerides: 211 mg/dL — ABNORMAL HIGH (ref <150)
VLDL: 42 mg/dL — ABNORMAL HIGH (ref 0–40)

## 2018-12-07 LAB — GLUCOSE, CAPILLARY
Glucose-Capillary: 224 mg/dL — ABNORMAL HIGH (ref 70–99)
Glucose-Capillary: 173 mg/dL — ABNORMAL HIGH (ref 70–99)
Glucose-Capillary: 119 mg/dL — ABNORMAL HIGH (ref 70–99)
Glucose-Capillary: 129 mg/dL — ABNORMAL HIGH (ref 70–99)

## 2018-12-07 LAB — D-DIMER, QUANTITATIVE: D-Dimer, Quant: 9.49 ug/mL-FEU — ABNORMAL HIGH (ref 0.00–0.50)

## 2018-12-07 LAB — APTT: aPTT: 34 seconds (ref 24–36)

## 2018-12-07 LAB — PROTIME-INR
INR: 1.4 — ABNORMAL HIGH (ref 0.8–1.2)
Prothrombin Time: 17.3 seconds — ABNORMAL HIGH (ref 11.4–15.2)

## 2018-12-07 LAB — AMMONIA: Ammonia: 43 umol/L — ABNORMAL HIGH (ref 9–35)

## 2018-12-07 LAB — FIBRINOGEN: Fibrinogen: 177 mg/dL — ABNORMAL LOW (ref 210–475)

## 2018-12-07 MED ORDER — INSULIN ASPART 100 UNIT/ML ~~LOC~~ SOLN: 0.0000 [IU] | Freq: Three times a day (TID) | SUBCUTANEOUS | Status: DC

## 2018-12-07 MED ORDER — INSULIN ASPART 100 UNIT/ML ~~LOC~~ SOLN
0.0000 [IU] | SUBCUTANEOUS | Status: DC
  Administered 2018-12-07: 3 [IU] via SUBCUTANEOUS

## 2018-12-07 MED ORDER — ONDANSETRON HCL 4 MG/2ML IJ SOLN
4.0000 mg | Freq: Four times a day (QID) | INTRAMUSCULAR | Status: DC | PRN
  Administered 2018-12-10 – 2018-12-11 (×2): 4 mg via INTRAVENOUS
  Filled 2018-12-07: qty 2

## 2018-12-07 MED ORDER — INSULIN DETEMIR 100 UNIT/ML ~~LOC~~ SOLN
15.0000 [IU] | Freq: Every day | SUBCUTANEOUS | Status: DC
  Administered 2018-12-07 – 2018-12-08 (×2): 15 [IU] via SUBCUTANEOUS
  Filled 2018-12-07 (×3): qty 0.15

## 2018-12-07 MED ORDER — ACETAMINOPHEN 325 MG PO TABS: 650.0000 mg | ORAL_TABLET | Freq: Four times a day (QID) | ORAL | Status: DC | PRN

## 2018-12-07 MED ORDER — ONDANSETRON HCL 4 MG PO TABS: 4.0000 mg | ORAL_TABLET | Freq: Four times a day (QID) | ORAL | Status: DC | PRN

## 2018-12-07 MED ORDER — ACETAMINOPHEN 650 MG RE SUPP: 650.0000 mg | Freq: Four times a day (QID) | RECTAL | Status: DC | PRN

## 2018-12-07 MED ORDER — INSULIN ASPART 100 UNIT/ML ~~LOC~~ SOLN
0.0000 [IU] | Freq: Three times a day (TID) | SUBCUTANEOUS | Status: DC
  Administered 2018-12-07 – 2018-12-09 (×3): 2 [IU] via SUBCUTANEOUS
  Administered 2018-12-10 (×3): 3 [IU] via SUBCUTANEOUS
  Administered 2018-12-11: 2 [IU] via SUBCUTANEOUS

## 2018-12-07 MED ORDER — SODIUM CHLORIDE 0.9 % IV SOLN
INTRAVENOUS | Status: DC
  Administered 2018-12-08 (×2): via INTRAVENOUS

## 2018-12-07 MED ORDER — TETRAHYDROZOLINE HCL 0.05 % OP SOLN: 2.0000 [drp] | Freq: Every day | OPHTHALMIC | Status: DC | PRN

## 2018-12-07 MED ORDER — SODIUM CHLORIDE 0.9 % IV SOLN
INTRAVENOUS | Status: DC
  Administered 2018-12-07: 06:00:00 via INTRAVENOUS

## 2018-12-07 MED ORDER — INSULIN ASPART 100 UNIT/ML ~~LOC~~ SOLN
0.0000 [IU] | Freq: Every day | SUBCUTANEOUS | Status: DC
  Administered 2018-12-10: 3 [IU] via SUBCUTANEOUS

## 2018-12-07 MED ORDER — MIRTAZAPINE 30 MG PO TABS
15.0000 mg | ORAL_TABLET | Freq: Every day | ORAL | Status: DC
  Administered 2018-12-07: 15 mg via ORAL
  Administered 2018-12-08 – 2018-12-11 (×4): 30 mg via ORAL
  Filled 2018-12-07 (×5): qty 1

## 2018-12-07 MED ORDER — HEPARIN SODIUM (PORCINE) 5000 UNIT/ML IJ SOLN
5000.0000 [IU] | Freq: Three times a day (TID) | INTRAMUSCULAR | Status: DC
  Administered 2018-12-07 – 2018-12-10 (×9): 5000 [IU] via SUBCUTANEOUS
  Filled 2018-12-07 (×9): qty 1

## 2018-12-07 NOTE — Progress Notes (Addendum)
PROGRESS NOTE   Nathan Davis  WRU:[id number]    DOB: May 02, 1946    DOA: 12/16/2018  PCP: [name], MD   I have briefly reviewed patients previous medical records in [facility].  Brief Narrative:  73 year old married male, lives with spouse, was independent with activities until recently when has to use cane, PMH of CLL diagnosed in 2017, has been on Imbruvica since 2019, iron deficiency anemia, HTN, HLD, DM 2, stage III CKD, seen by Dr. Alen Blew, primary oncologist in office on 11/25/2018 when he reported approximately 4 weeks history of progressive weakness, fatigue, joint pains and weight loss, these were felt to be due to [place] which was held despite which she had progressively worsening anorexia, generalized weakness and presented to ED.  Admitted for acute kidney injury felt to be related to dehydration, hyperkalemia, hypercalcemia, abnormal LFTs and general failure to thrive.   Assessment & Plan:   Principal Problem:   AKI (acute kidney injury) ([place]) Active Problems:   Type 2 diabetes mellitus with stage 3 chronic kidney disease, with long-term current use of insulin (HCC)   CLL (chronic lymphocytic leukemia) (HCC)   Lumbar compression fracture (HCC)   Hypercalcemia   Acute liver failure   1. Acute on stage III chronic kidney disease: Baseline creatinine probably in the 1.2 range (1.28 on 11/25/2018).  Presented with creatinine of 2.51.  Possibly due to dehydration from poor oral intake and hypercalcemia.  CT abdomen without hydronephrosis.  Patient reports taking some NSAIDs but unable to quantify.  Avoid nephrotoxic's.  Continue IV fluids, strict intake output and daily BMP.  Hopeful for recovery. 2. Dehydration with hyponatremia: IV fluids. 3. Hyperkalemia: Mild.  Resolved. 4. Hypercalcemia: Corrected serum calcium 11.2 today.  Continue IV saline hydration.  Discussed with oncology on-call, recommended considering for saline diuresis after hydration and may be  nasal calcitonin. 5. Abnormal LFTs: Unclear etiology.  No GI symptoms.  These were mildly abnormal even on 3/10 and have gotten slightly worse.  Total bilirubin 4.2.  No focal liver abnormality seen on CT abdomen.?  Related to Imbruvica-discontinued 3/10.  Low index of suspicion for DIC or liver failure. 6. Hypercalcemia: Mild.  Worsened by dehydration.  PCP worked up in February, PTH was low, 24-hour urine calcium was 174.  Follow PTH RP.  Holding home vitamin D.  Continue IV fluids.  Follow BMP. 7. CLL: Hemoglobin and leukocytosis stable.  I discussed with oncologist on call 3/22, recommends treating acute illness i.e. acute kidney injury and after stabilization, close outpatient follow-up with Dr. Alen Blew.  Avoiding prednisone due to uncontrolled DM2. 8. Uncontrolled type II DM with hyperglycemia: A1c 7.6 on 10/02/2018.  Continue Levemir 15 units daily (30 units daily at home) and NovoLog SSI.  Monitor closely and adjust insulins as needed.  Hold metformin. 9. Essential hypertension: Controlled.  Holding atenolol temporarily. 10. Hyperlipidemia: Hold statins. 11. Iron deficiency anemia: Stable. 12. Vertebral compression fractures: CT shows mild compression deformities of virtually all vertebral bodies of the lumbosacral spine, new compression deformity of T10-T12.  No pain reported.  Could be due to severe osteopenia versus metastatic disease.  Outpatient follow-up. 13. Prostatomegaly: Noted on CT.  Outpatient follow-up. 14. Abdominopelvic ascites: Reportedly moderate on CT but clinically not as severe. 15. Adult failure to thrive: Multifactorial due to multiple severe significant comorbidities as above complicating advanced age.  Treat as above.  PT evaluation.  Dietitian consultation.   DVT prophylaxis: Subcutaneous heparin Code Status: Full Family Communication: None at bedside Disposition:  Home pending clinical improvement.   Consultants:  None  Procedures:  None  Antimicrobials:   None   Subjective: Patient reports that he was independent prior to approximately 4 weeks ago.  Since then has progressively become weaker and has to use a cane to walk.  Has chronic cough for several months with intermittent brown sputum and occasional dyspnea on exertion.  No fever or chills.  Denies nausea, vomiting, abdominal pain, constipation or diarrhea.  Decreased appetite prior to admission.  Overall feels better.  Appetite is better.  No dyspnea at this time.  ROS: As above, otherwise negative  Objective:  Vitals:   12/07/18 0300 12/07/18 0330 12/07/18 0417 12/07/18 0918  BP: (!) 114/57 112/60 100/66 122/63  Pulse: 94 94 99 94  Resp:   18 17  Temp:   97.9 F (36.6 C) 98.5 F (36.9 C)  TempSrc:   Oral Oral  SpO2: 96% 96% 98% 95%  Weight:   71.8 kg   Height:        Examination:  General exam: Pleasant elderly male, moderately built and thinly nourished, frail and chronically ill looking lying comfortably propped up in bed.  Oral mucosa with borderline hydration. Respiratory system: Clear to auscultation. Respiratory effort normal. Cardiovascular system: S1 & S2 heard, RRR. No JVD, murmurs, rubs, gallops or clicks. No pedal edema.  Telemetry personally reviewed: Sinus rhythm. Gastrointestinal system: Abdomen is nondistended, soft and nontender. No organomegaly or masses felt. Normal bowel sounds heard. Central nervous system: Alert and oriented. No focal neurological deficits. Extremities: Symmetric 5 x 5 power. Skin: No rashes, lesions or ulcers Psychiatry: Judgement and insight appear normal. Mood & affect appropriate.     Data Reviewed: I have personally reviewed following labs and imaging studies  CBC: Recent Labs  Lab 11/22/2018 2144 12/07/18 0333  WBC 20.6* 20.4*  HGB 9.3* 8.8*  HCT 26.8* 26.6*  MCV 64.7* 65.2*  PLT 393 096   Basic Metabolic Panel: Recent Labs  Lab 11/17/2018 2144 12/07/18 0333  NA 133* 129*  K 5.4* 4.9  CL 103 101  CO2 19* 20*   GLUCOSE 287* 241*  BUN 64* 63*  CREATININE 2.51* 2.42*  CALCIUM 10.0 9.4   Liver Function Tests: Recent Labs  Lab 12/09/2018 2144 12/07/18 0333  AST 136* 130*  ALT 82* 81*  ALKPHOS 335* 320*  BILITOT 4.0* 4.2*  PROT 4.8* 4.6*  ALBUMIN 1.8* 1.7*   Coagulation Profile: Recent Labs  Lab 12/07/18 0333  INR 1.4*   CBG: Recent Labs  Lab 12/07/18 0745  GLUCAP 224*    No results found for this or any previous visit (from the past 240 hour(s)).       Radiology Studies: Ct Abdomen Pelvis Wo Contrast  Result Date: 12/07/2018 CLINICAL DATA:  Back pain. History of renal failure. EXAM: CT ABDOMEN AND PELVIS WITHOUT CONTRAST TECHNIQUE: Multidetector CT imaging of the abdomen and pelvis was performed following the standard protocol without IV contrast. COMPARISON:  12/13/2017 FINDINGS: Lower chest: Hazy ground-glass opacities in bilateral lung bases. Bibasilar atelectasis. Small pericardial effusion. Calcific atherosclerotic disease of the aorta and coronary arteries. Hepatobiliary: No focal liver abnormality is seen. No gallstones, gallbladder wall thickening, or biliary dilatation. Pancreas: Unremarkable. No pancreatic ductal dilatation or surrounding inflammatory changes. Spleen: Normal in size without focal abnormality. Adrenals/Urinary Tract: Adrenal glands are unremarkable. Kidneys are normal, without renal calculi, focal lesion, or hydronephrosis. Diffuse thickening of the urinary bladder wall. Stomach/Bowel: Stomach is within normal limits. Appendix appears normal. No evidence  of bowel wall thickening, distention, or inflammatory changes. Vascular/Lymphatic: Aortic atherosclerosis. No enlarged abdominal or pelvic lymph nodes. Reproductive: Enlargement of the prostate gland. Other: Moderate amount of abdominopelvic ascites. Ascites containing left inguinal hernia. Musculoskeletal: Diffusely mottled appearance of the osseous structures. Mild compression deformities of virtually all  vertebral bodies of the lumbosacral spine and lower thoracic spine. Healing or healed right-sided rib fractures. IMPRESSION: 1. Moderate amount of abdominopelvic ascites. 2. Hazy ground-glass opacities in bilateral lung bases, likely infectious or inflammatory. 3. Small pericardial effusion. 4. Diffusely mottled appearance of the osseous structures with mild compression deformities of virtually all vertebral bodies of the lumbosacral spine, mildly progressed. New compression deformity of T10 and T12 vertebral bodies. These findings may represent severe osteopenia or diffuse osseous metastatic disease. 5. Enlargement of the prostate gland with mild diffuse thickening of the urinary bladder wall, possibly due to outlet obstruction. Please correlate to serum PSA values. Electronically Signed   By: Fidela Salisbury M.D.   On: 12/07/2018 01:47   Dg Chest 2 View  Result Date: 12/09/2018 CLINICAL DATA:  Cough productive of gray brown mucus for 3 weeks, generalized weakness, nausea without vomiting, history type II diabetes mellitus, CLL, hypertension EXAM: CHEST - 2 VIEW COMPARISON:  03/26/2018 FINDINGS: Normal heart size, mediastinal contours, and pulmonary vascularity. Decreased lung volumes with bibasilar atelectasis slightly greater on LEFT. Central peribronchial thickening. Upper lungs clear. No pleural effusion or pneumothorax. Bones demineralized. IMPRESSION: Low lung volumes with bibasilar atelectasis and mild bronchitic changes. Electronically Signed   By: Lavonia Dana M.D.   On: 12/08/2018 22:32        Scheduled Meds: . heparin  5,000 Units Subcutaneous Q8H  . insulin aspart  0-9 Units Subcutaneous Q4H  . insulin detemir  15 Units Subcutaneous Daily  . mirtazapine  15-30 mg Oral QHS   Continuous Infusions: . sodium chloride 125 mL/hr at 12/07/18 0540     LOS: 0 days     Vernell Leep, MD, FACP, [facility]. [facility]  To contact the attending provider between 7A-7P or the covering  provider during after hours 7P-7A, please log into the web site [email or web address] and access using universal [place] password for that web site. If you do not have the password, please call the hospital operator.  12/07/2018, 11:33 AM

## 2018-12-07 NOTE — ED Notes (Signed)
Pt still in room 

## 2018-12-07 NOTE — H&P (Addendum)
History and Physical    OBADIAH DENNARD DGU:[id number] DOB: Dec 08, 1945 DOA: 11/22/2018  PCP: [name], MD  Patient coming from: Home  I have personally briefly reviewed patient's old medical records in [place]  Chief Complaint: Generalized weakness  HPI: Nathan Davis is a 73 y.o. male with medical history significant of CLL, DM2, CKD stage 3.  Patient presents to the ED with c/o generalized anorexia and generalized weakness over past several days.  No N/V/D, no fevers, chills, myalgias, sick contacts, or travel.  Some cough but no SOB.  Functional decline over the past 4 weeks.  Saw Dr. Alen Blew on 3/10, Dr. Alen Blew was concerned that his symptoms were likely related to [place] which was being used to treat CLL.  Imbruvica was stopped.  Despite this symptoms have continued / progressed this past week.   ED Course: AKI with creat 2.5 up from 1.2 on 3/10, BUN 64, K 5.4, Calcium of 10.0 with albumin of 1.8 (corrected calcium of 11.8).  T Bili 4.0, AST 136, ALT 82.  Flu PCR neg.  CT scan shows findings documented below.  Review of Systems: As per HPI otherwise 10 point review of systems negative.   Past Medical History:  Diagnosis Date   Cataract    CLL (chronic lymphocytic leukemia) ([place])    not treated yet   Colon polyps    Diabetes ([place]) 1992   type 2   ED (erectile dysfunction)    Femur fracture (HCC)    left   Heart murmur    History of hiatal hernia    Iron (Fe) deficiency anemia    Obesity    Other and unspecified hyperlipidemia    Premature beats, unspecified    Unspecified essential hypertension    Vitamin D deficiency     Past Surgical History:  Procedure Laterality Date   ETT - MYOVIEW     7'45", stopped due to fatigue, no chest pain. EF55%, no ischemia or infarction   HERNIA REPAIR     hiatal hernia  1994 , inguinal scheduled 01-02-18 with dr. Coralie Keens   INGUINAL HERNIA REPAIR Bilateral 02/06/2018   Procedure:  LAPAROSCOPIC BILATERAL INGUINAL HERNIA REPAIR;  Surgeon: Coralie Keens, MD;  Location: [facility];  Service: General;  Laterality: Bilateral;   INSERTION OF MESH Bilateral 02/06/2018   Procedure: INSERTION OF MESH;  Surgeon: Coralie Keens, MD;  Location: [facility];  Service: General;  Laterality: Bilateral;   nissan fundoplication N/A [id number]     reports that he has never smoked. He has never used smokeless tobacco. He reports that he does not drink alcohol or use drugs.  Allergies  Allergen Reactions   Amaryl [Glimepiride] Diarrhea   Codeine Itching   Pioglitazone Diarrhea, Itching and Anxiety    Family History  Problem Relation Age of Onset   Throat cancer Mother    Alzheimer's disease Father    Hypertension Other        family history   Heart attack Other 50     Prior to Admission medications   Medication Sig Start Date End Date Taking? Authorizing Provider  acetaminophen (TYLENOL) 500 MG tablet Take 1,000 mg by mouth every 6 (six) hours as needed for moderate pain or headache.   Yes [provider]  aspirin 81 MG tablet Take 81 mg by mouth daily.   Yes [provider]  atenolol (TENORMIN) 25 MG tablet Take 0.5 tablets (12.5 mg total) by mouth at bedtime. 10/02/18  Yes Liane Comber, NP  atorvastatin (LIPITOR) 80 MG tablet TAKE 1 TABLET A DAY Patient taking differently: Take 80 mg by mouth daily at 6 PM.  07/07/18  Yes [name], MD  Cholecalciferol (VITAMIN D3) 1000 units CAPS Take 2,000 Units by mouth daily.   Yes [provider]  LEVEMIR FLEXTOUCH 100 UNIT/ML Pen INJECT 30 UNITS INTO THE SKIN DAILY Patient taking differently: Inject 30 Units into the skin daily.  05/16/18  Yes [name], MD  loratadine (CLARITIN) 10 MG tablet Take 1 tablet daily for Allergies Patient taking differently: Take 10 mg by mouth daily as needed for allergies.  09/18/17  Yes Vicie Mutters, PA-C  Magnesium  250 MG TABS Take 1 tablet (250 mg total) by mouth 2 (two) times daily with a meal. 10/03/18  Yes Liane Comber, NP  metFORMIN (GLUCOPHAGE) 1000 MG tablet TAKE ONE TABLET BY MOUTH TWICE A DAY Patient taking differently: Take 1,000 mg by mouth 2 (two) times daily with a meal.  07/14/18  Yes [name], MD  mirtazapine (REMERON) 30 MG tablet Take 1/2 to 1 tablet 1 hour before bedtime  for appetite & sleep Patient taking differently: Take 30 mg by mouth at bedtime.  03/25/18  Yes [name], MD  Tetrahydrozoline HCl (VISINE OP) Place 2 drops into both eyes daily as needed (for dryness).    Yes [provider]  IMBRUVICA 420 MG TABS TAKE 1 TABLET (420 MG) BY MOUTH DAILY. TAKE WITH A GLASS OF WATER AT APPROX THE SAME TIME EACH DAY. Patient not taking: TAKE WITH A GLASS OF WATER AT APPROX THE SAME TIME EACH DAY 10/13/18   Wyatt Portela, MD  Insulin Pen Needle (B-D ULTRAFINE III SHORT PEN) 31G X 8 MM MISC USE DAILY WITH LEVEMIR. DX-E11.29. 05/28/18   [name], MD    Physical Exam: Vitals:   12/07/18 0100 12/07/18 0130 12/07/18 0200 12/07/18 0230  BP: (!) 122/57 109/60 (!) 103/58 (!) 101/58  Pulse: 92  95 95  Resp:      Temp:      TempSrc:      SpO2: 96%  96% 96%  Weight:      Height:        Constitutional: NAD, calm, comfortable Eyes: PERRL, lids and conjunctivae normal ENMT: Mucous membranes are moist. Posterior pharynx clear of any exudate or lesions.Normal dentition.  Neck: normal, supple, no masses, no thyromegaly Respiratory: clear to auscultation bilaterally, no wheezing, no crackles. Normal respiratory effort. No accessory muscle use.  Cardiovascular: Regular rate and rhythm, no murmurs / rubs / gallops. No extremity edema. 2+ pedal pulses. No carotid bruits.  Abdomen: no tenderness, no masses palpated. No hepatosplenomegaly. Bowel sounds positive.  Musculoskeletal: no clubbing / cyanosis. No joint deformity upper and lower extremities. Good ROM, no  contractures. Normal muscle tone.  Skin: no rashes, lesions, ulcers. No induration Neurologic: CN 2-12 grossly intact. Sensation intact, DTR normal. Strength 5/5 in all 4.  Psychiatric: Normal judgment and insight. Alert and oriented x 3. Normal mood.    Labs on Admission: I have personally reviewed following labs and imaging studies  CBC: Recent Labs  Lab 11/28/2018 2144  WBC 20.6*  HGB 9.3*  HCT 26.8*  MCV 64.7*  PLT 347   Basic Metabolic Panel: Recent Labs  Lab 12/04/2018 2144  NA 133*  K 5.4*  CL 103  CO2 19*  GLUCOSE 287*  BUN 64*  CREATININE 2.51*  CALCIUM 10.0   GFR: Estimated Creatinine Clearance: 25.4 mL/min (  A) (by C-G formula based on SCr of 2.51 mg/dL (H)). Liver Function Tests: Recent Labs  Lab 12/09/2018 2144  AST 136*  ALT 82*  ALKPHOS 335*  BILITOT 4.0*  PROT 4.8*  ALBUMIN 1.8*   Recent Labs  Lab 11/26/2018 2144  LIPASE 38   No results for input(s): AMMONIA in the last 168 hours. Coagulation Profile: No results for input(s): INR, PROTIME in the last 168 hours. Cardiac Enzymes: No results for input(s): CKTOTAL, CKMB, CKMBINDEX, TROPONINI in the last 168 hours. BNP (last 3 results) No results for input(s): PROBNP in the last 8760 hours. HbA1C: No results for input(s): HGBA1C in the last 72 hours. CBG: No results for input(s): GLUCAP in the last 168 hours. Lipid Profile: No results for input(s): CHOL, HDL, LDLCALC, TRIG, CHOLHDL, LDLDIRECT in the last 72 hours. Thyroid Function Tests: No results for input(s): TSH, T4TOTAL, FREET4, T3FREE, THYROIDAB in the last 72 hours. Anemia Panel: No results for input(s): VITAMINB12, FOLATE, FERRITIN, TIBC, IRON, RETICCTPCT in the last 72 hours. Urine analysis:    Component Value Date/Time   COLORURINE YELLOW 06/26/2018 1634   APPEARANCEUR TURBID (A) 06/26/2018 1634   LABSPEC 1.017 06/26/2018 1634   PHURINE 6.5 06/26/2018 1634   GLUCOSEU NEGATIVE 06/26/2018 1634   HGBUR NEGATIVE 06/26/2018 1634    BILIRUBINUR NEGATIVE 02/23/2016 1136   [place] 06/26/2018 1634   PROTEINUR 1+ (A) 06/26/2018 1634   UROBILINOGEN 1 08/27/2013 1110   NITRITE NEGATIVE 06/26/2018 1634   LEUKOCYTESUR NEGATIVE 06/26/2018 1634    Radiological Exams on Admission: Ct Abdomen Pelvis Wo Contrast  Result Date: 12/07/2018 CLINICAL DATA:  Back pain. History of renal failure. EXAM: CT ABDOMEN AND PELVIS WITHOUT CONTRAST TECHNIQUE: Multidetector CT imaging of the abdomen and pelvis was performed following the standard protocol without IV contrast. COMPARISON:  12/13/2017 FINDINGS: Lower chest: Hazy ground-glass opacities in bilateral lung bases. Bibasilar atelectasis. Small pericardial effusion. Calcific atherosclerotic disease of the aorta and coronary arteries. Hepatobiliary: No focal liver abnormality is seen. No gallstones, gallbladder wall thickening, or biliary dilatation. Pancreas: Unremarkable. No pancreatic ductal dilatation or surrounding inflammatory changes. Spleen: Normal in size without focal abnormality. Adrenals/Urinary Tract: Adrenal glands are unremarkable. Kidneys are normal, without renal calculi, focal lesion, or hydronephrosis. Diffuse thickening of the urinary bladder wall. Stomach/Bowel: Stomach is within normal limits. Appendix appears normal. No evidence of bowel wall thickening, distention, or inflammatory changes. Vascular/Lymphatic: Aortic atherosclerosis. No enlarged abdominal or pelvic lymph nodes. Reproductive: Enlargement of the prostate gland. Other: Moderate amount of abdominopelvic ascites. Ascites containing left inguinal hernia. Musculoskeletal: Diffusely mottled appearance of the osseous structures. Mild compression deformities of virtually all vertebral bodies of the lumbosacral spine and lower thoracic spine. Healing or healed right-sided rib fractures. IMPRESSION: 1. Moderate amount of abdominopelvic ascites. 2. Hazy ground-glass opacities in bilateral lung bases, likely infectious  or inflammatory. 3. Small pericardial effusion. 4. Diffusely mottled appearance of the osseous structures with mild compression deformities of virtually all vertebral bodies of the lumbosacral spine, mildly progressed. New compression deformity of T10 and T12 vertebral bodies. These findings may represent severe osteopenia or diffuse osseous metastatic disease. 5. Enlargement of the prostate gland with mild diffuse thickening of the urinary bladder wall, possibly due to outlet obstruction. Please correlate to serum PSA values. Electronically Signed   By: Fidela Salisbury M.D.   On: 12/07/2018 01:47   Dg Chest 2 View  Result Date: 12/11/2018 CLINICAL DATA:  Cough productive of gray brown mucus for 3 weeks, generalized weakness, nausea without  vomiting, history type II diabetes mellitus, CLL, hypertension EXAM: CHEST - 2 VIEW COMPARISON:  03/26/2018 FINDINGS: Normal heart size, mediastinal contours, and pulmonary vascularity. Decreased lung volumes with bibasilar atelectasis slightly greater on LEFT. Central peribronchial thickening. Upper lungs clear. No pleural effusion or pneumothorax. Bones demineralized. IMPRESSION: Low lung volumes with bibasilar atelectasis and mild bronchitic changes. Electronically Signed   By: Lavonia Dana M.D.   On: 12/07/2018 22:32    EKG: Independently reviewed.  Assessment/Plan Principal Problem:   AKI (acute kidney injury) ([place]) Active Problems:   Type 2 diabetes mellitus with stage 3 chronic kidney disease, with long-term current use of insulin (HCC)   CLL (chronic lymphocytic leukemia) (HCC)   Lumbar compression fracture (HCC)   Hypercalcemia   Acute liver failure    1. AKI - 1. Suspect dehydration due to poor PO intake 2. IVF - 1L bolus and NS at 125 cc/hr 3. UA pending 4. Urine lytes 5. Bladder scan - foley if he has urinary retention 6. Repeat CMP in AM 7. Check MM panel 2. Acute liver abnormalities - 1. Repeat CMP in AM 2. Check ammonia 3. Check  hepatitis pnl - though doesn't look like viral hepatitis. 4. Check INR and Aptt 5. Possibly due to Imbruvica side effect? Or paraneoplastic syndrome? 6. Will also obtain labs to r/o [place] given CLL history, though seems to be too sub-acute for this. 1. D.Dimer 2. Fibrinogen 3. Lipid pnl for triglicerides 3. Hypercalcemia - suspect related to the diffuse abnormalities in skeletal structures seen on CT scan 1. Looks like PCP did work up on this in Feb: 1. PTH was low 2. 24h urine calcium was 174 2. IVF 3. Repeat in AM 4. Checking PTH-RP 5. Holding home Vit D 4. Concern for prostate CA on CT scan - 1. Has known H/O BPH 2. PSA was nl when last checked in fall of last year 5. CLL - 1. WBC 20k 2. Will put consult to Dr. Alen Blew in epic, give him a call in AM. 6. DM2 - 1. Levemir 15 daily 2. Sensitive scale SSI AC  DVT prophylaxis: Heparin [place] Code Status: Full Family Communication: Family at bedside Disposition Plan: Home after admit Consults called: None, Call Dr. Alen Blew and possibly GI in AM Admission status: Admit to inpatient  Severity of Illness: The appropriate patient status for this patient is INPATIENT. Inpatient status is judged to be reasonable and necessary in order to provide the required intensity of service to ensure the patient's safety. The patient's presenting symptoms, physical exam findings, and initial radiographic and laboratory data in the context of their chronic comorbidities is felt to place them at high risk for further clinical deterioration. Furthermore, it is not anticipated that the patient will be medically stable for discharge from the hospital within 2 midnights of admission. The following factors support the patient status of inpatient.   " The patient's presenting symptoms include Generalized weakness, anorexia. " The initial radiographic and laboratory data are worrisome because of AKI, acute liver abnormalities. " The chronic co-morbidities include  CLL.   * I certify that at the point of admission it is my clinical judgment that the patient will require inpatient hospital care spanning beyond 2 midnights from the point of admission due to high intensity of service, high risk for further deterioration and high frequency of surveillance required.*    Haidynn Almendarez M. DO [facility]  How to contact the [facility] Attending or Consulting provider [place] or covering provider  during after hours 7P -7A, for this patient?  1. Check the care team in [facility] and look for a) attending/consulting [facility] provider listed and b) the [facility] team listed 2. Log into [email or web address]  Amion Physician Scheduling and messaging for groups and whole hospitals  On call and physician scheduling software for group practices, residents, hospitalists and other medical providers for call, clinic, rotation and shift schedules. On[employer] is a hospital-wide system for scheduling doctors and paging doctors on call. EasyPlot is for scientific plotting and data analysis.  [email or web address]  and use [place]'s universal password to access. If you do not have the password, please contact the hospital operator.  3. Locate the [facility] provider you are looking for under [facility] and page to a number that you can be directly reached. 4. If you still have difficulty reaching the provider, please page the [facility] (Director on Call) for the Hospitalists listed on amion for assistance.  12/07/2018, 3:15 AM

## 2018-12-07 NOTE — ED Notes (Addendum)
ED TO INPATIENT HANDOFF REPORT  ED Nurse Name and Phone #:  Rodney Langton [phone]  S Name/Age/Gender Nathan Davis 73 y.o. male Room/Bed: 021C/021C  Code Status   Code Status: Full Code  Home/SNF/Other Home Patient oriented to: self and situation Is this baseline? Yes   Triage Complete: Triage complete  Chief Complaint afib  Triage Note Pt c/o loss of appetite, feeling dehydrated, weak.   Allergies Allergies  Allergen Reactions  . Amaryl [Glimepiride] Diarrhea  . Codeine Itching  . Pioglitazone Diarrhea, Itching and Anxiety    Level of Care/Admitting Diagnosis ED Disposition    ED Disposition Condition [facility] Area: [place] [[id number]]  Level of Care: Telemetry Medical [104]  Diagnosis: AKI (acute kidney injury) [facility]) [[id number]]  Admitting Physician: Doreatha Massed  Attending Physician: Etta Quill [phone]  Estimated length of stay: past midnight tomorrow  Certification:: I certify this patient will need inpatient services for at least 2 midnights  PT Class (Do Not Modify): Inpatient [101]  PT Acc Code (Do Not Modify): Private [1]       B Medical/Surgery History Past Medical History:  Diagnosis Date  . Cataract   . CLL (chronic lymphocytic leukemia) ([place])    not treated yet  . Colon polyps   . Diabetes ([place]) 1992   type 2  . ED (erectile dysfunction)   . Femur fracture ([facility])    left  . Heart murmur   . History of hiatal hernia   . Iron (Fe) deficiency anemia   . Obesity   . Other and unspecified hyperlipidemia   . Premature beats, unspecified   . Unspecified essential hypertension   . Vitamin D deficiency    Past Surgical History:  Procedure Laterality Date  . ETT - MYOVIEW     7'45", stopped due to fatigue, no chest pain. EF55%, no ischemia or infarction  . HERNIA REPAIR     hiatal hernia  1994 , inguinal scheduled 01-02-18 with dr. Coralie Keens  . INGUINAL HERNIA REPAIR Bilateral  02/06/2018   Procedure: LAPAROSCOPIC BILATERAL INGUINAL HERNIA REPAIR;  Surgeon: Coralie Keens, MD;  Location: [facility];  Service: General;  Laterality: Bilateral;  . INSERTION OF MESH Bilateral 02/06/2018   Procedure: INSERTION OF MESH;  Surgeon: Coralie Keens, MD;  Location: [facility];  Service: General;  Laterality: Bilateral;  . nissan fundoplication N/A 76/22/6333     A IV Location/Drains/Wounds Patient Lines/Drains/Airways Status   Active Line/Drains/Airways    Name:   Placement date:   Placement time:   Site:   Days:   Peripheral IV 12/11/2018 Left Forearm   11/24/2018    2155    Forearm   1   Incision (Closed) 02/06/18 Abdomen Other (Comment)   02/06/18    1054     304   Incision - 3 Ports Abdomen 1: Umbilicus 2: Mid 3: Mid;Lower   02/06/18    1018     304          Intake/Output Last 24 hours No intake or output data in the 24 hours ending 12/07/18 0328  Labs/Imaging Results for orders placed or performed during the hospital encounter of 12/10/2018 (from the past 48 hour(s))  CBC     Status: Abnormal   Collection Time: 11/19/2018  9:44 PM  Result Value Ref Range   WBC 20.6 (H) 4.0 - 10.5 K/uL   RBC 4.14 (L) 4.22 - 5.81 MIL/uL  Hemoglobin 9.3 (L) 13.0 - 17.0 g/dL   HCT 26.8 (L) 39.0 - 52.0 %   MCV 64.7 (L) 80.0 - 100.0 fL   MCH 22.5 (L) 26.0 - 34.0 pg   MCHC 34.7 30.0 - 36.0 g/dL   RDW 22.5 (H) 11.5 - 15.5 %   Platelets 393 150 - 400 K/uL    Comment: REPEATED TO VERIFY   nRBC 0.0 0.0 - 0.2 %    Comment: Performed at [facility] [address] [place], [place] [address]  Comprehensive metabolic panel     Status: Abnormal   Collection Time: 12/01/2018  9:44 PM  Result Value Ref Range   Sodium 133 (L) 135 - 145 mmol/L   Potassium 5.4 (H) 3.5 - 5.1 mmol/L   Chloride 103 98 - 111 mmol/L   CO2 19 (L) 22 - 32 mmol/L   Glucose, Bld 287 (H) 70 - 99 mg/dL   BUN 64 (H) 8 - 23 mg/dL   Creatinine, Ser 2.51 (H) 0.61 - 1.24 mg/dL    Calcium 10.0 8.9 - 10.3 mg/dL   Total Protein 4.8 (L) 6.5 - 8.1 g/dL   Albumin 1.8 (L) 3.5 - 5.0 g/dL   AST 136 (H) 15 - 41 U/L   ALT 82 (H) 0 - 44 U/L   Alkaline Phosphatase 335 (H) 38 - 126 U/L   Total Bilirubin 4.0 (H) 0.3 - 1.2 mg/dL   GFR calc non Af Amer 24 (L) >60 mL/min   GFR calc Af Amer 28 (L) >60 mL/min   Anion gap 11 5 - 15    Comment: Performed at [facility], [place] [address] [place], [place] [address]  Lipase, blood     Status: None   Collection Time: 12/16/2018  9:44 PM  Result Value Ref Range   Lipase 38 11 - 51 U/L    Comment: Performed at [facility], [place] [address], [place], [place] [address]  Influenza panel by PCR (type A & B)     Status: None   Collection Time: 12/05/2018 10:26 PM  Result Value Ref Range   Influenza A By PCR NEGATIVE NEGATIVE   Influenza B By PCR NEGATIVE NEGATIVE    Comment: (NOTE) The Xpert Xpress Flu assay is intended as an aid in the diagnosis of  influenza and should not be used as a sole basis for treatment.  This  assay is FDA approved for nasopharyngeal swab specimens only. Nasal  washings and aspirates are unacceptable for Xpert Xpress Flu testing. Performed at [facility], [place] [address] [place], [place] [address]    Ct Abdomen Pelvis Wo Contrast  Result Date: 12/07/2018 CLINICAL DATA:  Back pain. History of renal failure. EXAM: CT ABDOMEN AND PELVIS WITHOUT CONTRAST TECHNIQUE: Multidetector CT imaging of the abdomen and pelvis was performed following the standard protocol without IV contrast. COMPARISON:  12/13/2017 FINDINGS: Lower chest: Hazy ground-glass opacities in bilateral lung bases. Bibasilar atelectasis. Small pericardial effusion. Calcific atherosclerotic disease of the aorta and coronary arteries. Hepatobiliary: No focal liver abnormality is seen. No gallstones, gallbladder wall thickening, or biliary dilatation. Pancreas: Unremarkable. No pancreatic ductal dilatation or surrounding inflammatory changes.  Spleen: Normal in size without focal abnormality. Adrenals/Urinary Tract: Adrenal glands are unremarkable. Kidneys are normal, without renal calculi, focal lesion, or hydronephrosis. Diffuse thickening of the urinary bladder wall. Stomach/Bowel: Stomach is within normal limits. Appendix appears normal. No evidence of bowel wall thickening, distention, or inflammatory changes. Vascular/Lymphatic: Aortic atherosclerosis.  No enlarged abdominal or pelvic lymph nodes. Reproductive: Enlargement of the prostate gland. Other: Moderate amount of abdominopelvic ascites. Ascites containing left inguinal hernia. Musculoskeletal: Diffusely mottled appearance of the osseous structures. Mild compression deformities of virtually all vertebral bodies of the lumbosacral spine and lower thoracic spine. Healing or healed right-sided rib fractures. IMPRESSION: 1. Moderate amount of abdominopelvic ascites. 2. Hazy ground-glass opacities in bilateral lung bases, likely infectious or inflammatory. 3. Small pericardial effusion. 4. Diffusely mottled appearance of the osseous structures with mild compression deformities of virtually all vertebral bodies of the lumbosacral spine, mildly progressed. New compression deformity of T10 and T12 vertebral bodies. These findings may represent severe osteopenia or diffuse osseous metastatic disease. 5. Enlargement of the prostate gland with mild diffuse thickening of the urinary bladder wall, possibly due to outlet obstruction. Please correlate to serum PSA values. Electronically Signed   By: Fidela Salisbury M.D.   On: 12/07/2018 01:47   Dg Chest 2 View  Result Date: 11/27/2018 CLINICAL DATA:  Cough productive of gray brown mucus for 3 weeks, generalized weakness, nausea without vomiting, history type II diabetes mellitus, CLL, hypertension EXAM: CHEST - 2 VIEW COMPARISON:  03/26/2018 FINDINGS: Normal heart size, mediastinal contours, and pulmonary vascularity. Decreased lung volumes with  bibasilar atelectasis slightly greater on LEFT. Central peribronchial thickening. Upper lungs clear. No pleural effusion or pneumothorax. Bones demineralized. IMPRESSION: Low lung volumes with bibasilar atelectasis and mild bronchitic changes. Electronically Signed   By: Lavonia Dana M.D.   On: 12/13/2018 22:32    Pending Labs Unresulted Labs (From admission, onward)    Start     Ordered   12/07/18 0500  Comprehensive metabolic panel  Tomorrow morning,   R     12/07/18 0240   12/07/18 0500  CBC  Tomorrow morning,   R     12/07/18 0315   12/07/18 0250  APTT  Once,   R     12/07/18 0249   12/07/18 0250  Fibrinogen  Once,   R     12/07/18 0249   12/07/18 0250  D-dimer, quantitative (not at [facility])  Once,   R     12/07/18 0249   12/07/18 0249  Lactate dehydrogenase  Once,   R     12/07/18 0248   12/07/18 0249  Lipid panel  Once,   R     12/07/18 0248   12/07/18 0247  Protime-INR  Once,   R     12/07/18 0246   12/07/18 0240  Hepatitis panel, acute  Once,   R     12/07/18 0239   12/07/18 0237  Ammonia  Once,   R     12/07/18 0239   12/07/18 0236  PTH-related peptide  Once,   R     12/07/18 0239   12/07/18 0235  Multiple Myeloma Panel (SPEP&IFE w/QIG)  Once,   R     12/07/18 0239   12/07/18 0232  Na and K (sodium & potassium), rand urine  Once,   R     12/07/18 0231   12/07/18 0231  Urinalysis, Routine w reflex microscopic  ONCE - STAT,   R     12/07/18 0231   12/07/18 0231  Creatinine, urine, random  Once,   R     12/07/18 0231   12/13/2018 2156  Urinalysis, Routine w reflex microscopic  Once,   R     11/18/2018 2155          Vitals/Pain Today's Vitals  12/07/18 0100 12/07/18 0130 12/07/18 0200 12/07/18 0230  BP: (!) 122/57 109/60 (!) 103/58 (!) 101/58  Pulse: 92  95 95  Resp:      Temp:      TempSrc:      SpO2: 96%  96% 96%  Weight:      Height:      PainSc:        Isolation Precautions No active isolations  Medications Medications  0.9 %  sodium chloride infusion  (has no administration in time range)  mirtazapine (REMERON) tablet 15-30 mg (has no administration in time range)  tetrahydrozoline 0.05 % ophthalmic solution 2 drop (has no administration in time range)  insulin aspart (novoLOG) injection 0-15 Units (has no administration in time range)  insulin detemir (LEVEMIR) injection 15 Units (has no administration in time range)  acetaminophen (TYLENOL) tablet 650 mg (has no administration in time range)    Or  acetaminophen (TYLENOL) suppository 650 mg (has no administration in time range)  ondansetron (ZOFRAN) tablet 4 mg (has no administration in time range)    Or  ondansetron (ZOFRAN) injection 4 mg (has no administration in time range)  heparin injection 5,000 Units (has no administration in time range)  sodium chloride 0.9 % bolus 1,000 mL (0 mLs Intravenous Stopped 11/29/2018 2332)    Mobility walks with device Low fall risk   Focused Assessments Cardiac Assessment Handoff:    No results found for: CKTOTAL, CKMB, CKMBINDEX, TROPONINI No results found for: DDIMER Does the Patient currently have chest pain? No   , Neuro Assessment Handoff:  Swallow screen pass? N/A         Neuro Assessment:   Neuro Checks:      Last Documented NIHSS Modified Score:   Has TPA been given? No If patient is a Neuro Trauma and patient is going to OR before floor call report to [facility] nurse: [phone] or [phone]  , Pulmonary Assessment Handoff:  Lung sounds: Bilateral Breath Sounds: Clear, Diminished O2 Device: Room Air        R Recommendations: See Admitting Provider Note  Report given to:  Nurse on Floor  Additional Notes:  Chronic back pain and wife at bedside.

## 2018-12-07 NOTE — ED Provider Notes (Signed)
  Physical Exam  BP 127/62   Pulse 92   Temp 98 F (36.7 C) (Rectal)   Resp (!) 28   Ht 5\' 8"  (1.727 m)   Wt 69.9 kg   SpO2 96%   BMI 23.42 kg/m   Physical Exam  ED Course/Procedures     Procedures  MDM   Assumed care from Dr. Venora Maples. According to Dr. Venora Maples patient has history of diabetes, CLL, CKD. Patient had come into the ER from home because of weakness and loss of appetite.  Dr. Venora Maples is done for evaluation.  At this time flu swab is pending and so is a CT scan of the abdomen.  Patient is noted to have elevated bilirubin and AKI.  Dr. Venora Maples reports that there is no clinical concerns for infection at this time.  Chest x-ray is clear.  Patient is having some cough along with nausea and abdominal discomfort for which she has ordered CT scan of the abdomen.  Patient unlikely to be septic according to Dr. Venora Maples.  2:20 AM CT abdomen does not reveal any acute findings. Questionable inflammatory versus infectious process over the lower lung fields. We will carry on with the primary treatment team's plan to admit for AKI and hyperbilirubinemia which likely are because of his cancer and dehydration.      Varney Biles, MD 12/07/18 [phone]

## 2018-12-07 NOTE — ED Notes (Addendum)
Patient transported to CT 

## 2018-12-08 ENCOUNTER — Inpatient Hospital Stay (HOSPITAL_COMMUNITY): Payer: Medicare Other

## 2018-12-08 DIAGNOSIS — I5022 Chronic systolic (congestive) heart failure: Secondary | ICD-10-CM

## 2018-12-08 LAB — GLUCOSE, CAPILLARY
Glucose-Capillary: 84 mg/dL (ref 70–99)
Glucose-Capillary: 78 mg/dL (ref 70–99)
Glucose-Capillary: 85 mg/dL (ref 70–99)
Glucose-Capillary: 113 mg/dL — ABNORMAL HIGH (ref 70–99)

## 2018-12-08 LAB — CBC WITH DIFFERENTIAL/PLATELET
Abs Immature Granulocytes: 1.21 10*3/uL — ABNORMAL HIGH (ref 0.00–0.07)
Basophils Absolute: 0.2 10*3/uL — ABNORMAL HIGH (ref 0.0–0.1)
Basophils Relative: 1 %
Eosinophils Absolute: 0.5 10*3/uL (ref 0.0–0.5)
Eosinophils Relative: 2 %
HCT: 26 % — ABNORMAL LOW (ref 39.0–52.0)
Hemoglobin: 9 g/dL — ABNORMAL LOW (ref 13.0–17.0)
Immature Granulocytes: 5 %
Lymphocytes Relative: 36 %
Lymphs Abs: 8.5 10*3/uL — ABNORMAL HIGH (ref 0.7–4.0)
MCH: 22.4 pg — ABNORMAL LOW (ref 26.0–34.0)
MCHC: 34.6 g/dL (ref 30.0–36.0)
MCV: 64.7 fL — ABNORMAL LOW (ref 80.0–100.0)
Monocytes Absolute: 2.3 10*3/uL — ABNORMAL HIGH (ref 0.1–1.0)
Monocytes Relative: 10 %
Neutro Abs: 11.1 10*3/uL — ABNORMAL HIGH (ref 1.7–7.7)
Neutrophils Relative %: 46 %
Platelets: 431 10*3/uL — ABNORMAL HIGH (ref 150–400)
RBC: 4.02 MIL/uL — ABNORMAL LOW (ref 4.22–5.81)
RDW: 22.4 % — ABNORMAL HIGH (ref 11.5–15.5)
WBC: 23.7 10*3/uL — ABNORMAL HIGH (ref 4.0–10.5)
nRBC: 0 % (ref 0.0–0.2)

## 2018-12-08 LAB — COMPREHENSIVE METABOLIC PANEL
ALT: 88 U/L — ABNORMAL HIGH (ref 0–44)
AST: 133 U/L — ABNORMAL HIGH (ref 15–41)
Albumin: 1.7 g/dL — ABNORMAL LOW (ref 3.5–5.0)
Alkaline Phosphatase: 344 U/L — ABNORMAL HIGH (ref 38–126)
Anion gap: 9 (ref 5–15)
BUN: 66 mg/dL — ABNORMAL HIGH (ref 8–23)
CO2: 18 mmol/L — ABNORMAL LOW (ref 22–32)
Calcium: 9.3 mg/dL (ref 8.9–10.3)
Chloride: 104 mmol/L (ref 98–111)
Creatinine, Ser: 2.51 mg/dL — ABNORMAL HIGH (ref 0.61–1.24)
GFR calc Af Amer: 28 mL/min — ABNORMAL LOW (ref >60)
GFR calc non Af Amer: 24 mL/min — ABNORMAL LOW (ref >60)
Glucose, Bld: 99 mg/dL (ref 70–99)
Potassium: 4.5 mmol/L (ref 3.5–5.1)
Sodium: 131 mmol/L — ABNORMAL LOW (ref 135–145)
Total Bilirubin: 4.7 mg/dL — ABNORMAL HIGH (ref 0.3–1.2)
Total Protein: 4.6 g/dL — ABNORMAL LOW (ref 6.5–8.1)

## 2018-12-08 LAB — URINALYSIS, ROUTINE W REFLEX MICROSCOPIC
Bilirubin Urine: NEGATIVE
Glucose, UA: NEGATIVE mg/dL
Ketones, ur: NEGATIVE mg/dL
Leukocytes,Ua: NEGATIVE
Nitrite: NEGATIVE
Protein, ur: 100 mg/dL — AB
Specific Gravity, Urine: 1.026 (ref 1.005–1.030)
pH: 5 (ref 5.0–8.0)

## 2018-12-08 LAB — HEPATITIS PANEL, ACUTE
HCV Ab: <0.1 s/co ratio (ref 0.0–0.9)
Hep A IgM: NEGATIVE
Hep B C IgM: NEGATIVE
Hepatitis B Surface Ag: NEGATIVE

## 2018-12-08 LAB — CREATININE, URINE, RANDOM: Creatinine, Urine: 143.52 mg/dL

## 2018-12-08 LAB — NA AND K (SODIUM & POTASSIUM), RAND UR
Potassium Urine: 66 mmol/L
Sodium, Ur: 16 mmol/L

## 2018-12-08 MED ORDER — ENSURE ENLIVE PO LIQD
237.0000 mL | Freq: Two times a day (BID) | ORAL | Status: DC
  Administered 2018-12-08 – 2018-12-11 (×5): 237 mL via ORAL

## 2018-12-08 MED ORDER — CALCITONIN (SALMON) 200 UNIT/ACT NA SOLN
1.0000 | Freq: Every day | NASAL | Status: DC
  Administered 2018-12-08: 1 via NASAL
  Filled 2018-12-08: qty 3.7

## 2018-12-08 MED ORDER — SODIUM CHLORIDE 0.9 % IV SOLN
INTRAVENOUS | Status: AC
  Administered 2018-12-08: 11:00:00 via INTRAVENOUS

## 2018-12-08 NOTE — Progress Notes (Signed)
PT Cancellation Note  Patient Details Name: Nathan Davis MRN: [id number] DOB: 08/19/1946   Cancelled Treatment:    Reason Eval/Treat Not Completed: Patient at procedure or test/unavailable. Checked on pt several times this morning with other hospital staff present in room each time. Will continue to follow and initiate PT evaluation when able.    [name] 12/08/2018, 11:04 AM   [name], PT, DPT Acute Rehabilitation Services Pager: [phone] Office: [phone]

## 2018-12-08 NOTE — Progress Notes (Signed)
Initial Nutrition Assessment  DOCUMENTATION CODES:   Non-severe (moderate) malnutrition in context of chronic illness  INTERVENTION:  Ensure Enlive po BID, each supplement provides 350 kcal and 20 grams of protein (patient likes chocolate) Magic cup TID with meals, each supplement provides 290 kcal and 9 grams of protein (pt would like to try orange cream)    NUTRITION DIAGNOSIS:   Moderate Malnutrition related to chronic illness as evidenced by mild fat depletion, moderate muscle depletion, energy intake < or equal to 75% for > or equal to 1 month.   GOAL:   Patient will meet greater than or equal to 90% of their needs   MONITOR:   PO intake, Supplement acceptance, Weight trends  REASON FOR ASSESSMENT:   Consult Assessment of nutrition requirement/status  ASSESSMENT:  73 year old male with PMH of CLL on [place] since 2019, HTN, HLD, T2DM, CKD III who has history of progressive weakness, fatigue, joint pain, and wt loss x 4 weeks admitted for acute kidney injury, dehydration, hyperkalemia, hypercalcemia, and general failure to thrive.  Patient reports good PO this morning and recalls a boiled egg and milk, patient had difficulty recalling additional items or previous meals. He stated that his appetite goes up and down, 2-3 meals depending. He endorses recent wt losses and recalls UBW of 160lbs a few months ago.   Patient current wt is 155.7 lbs with some mild BLE edema.   3/22 on CT of abdomen and pelvis - enlarged prostate with possible bladder outlet obstruction  Medications reviewed: SSI, Levemir (15 units daily)  Labs: Na 131 (L) NUTRITION - FOCUSED PHYSICAL EXAM:    Most Recent Value  Orbital Region  Moderate depletion  Upper Arm Region  Mild depletion  Thoracic and Lumbar Region  Mild depletion  Buccal Region  Mild depletion  Temple Region  Moderate depletion  Clavicle Bone Region  Moderate depletion  Clavicle and Acromion Bone Region  Mild depletion   Scapular Bone Region  Unable to assess  Dorsal Hand  Moderate depletion  Patellar Region  Mild depletion  Anterior Thigh Region  Mild depletion  Posterior Calf Region  No depletion  Edema (RD Assessment)  Mild  Hair  Reviewed  Eyes  Reviewed  Mouth  Reviewed [wears dentures]  Skin  Reviewed  Nails  Reviewed       Diet Order:   Diet Order            Diet Carb Modified Fluid consistency: Thin; Room service appropriate? Yes  Diet effective now              EDUCATION NEEDS:   No education needs have been identified at this time  Skin:  Skin Assessment: Reviewed RN Assessment  Last BM:  3/21  Height:   Ht Readings from Last 1 Encounters:  11/25/2018 5\' 8"  (1.727 m)    Weight:   Wt Readings from Last 1 Encounters:  12/07/18 70.8 kg    Ideal Body Weight:  70 kg  BMI:  Body mass index is 23.73 kg/m.  Estimated Nutritional Needs:   Kcal:  1775-2100  Protein:  84-100 grams  Fluid:  >2.1L    Lajuan Lines, RD, LDN  After Hours/Weekend Pager: [phone]

## 2018-12-08 NOTE — Progress Notes (Addendum)
PROGRESS NOTE   Nathan Davis  FBX:[id number]    DOB: 05-01-1946    DOA: 11/29/2018  PCP: [name], MD   I have briefly reviewed patients previous medical records in [facility].  Brief Narrative:  73 year old married male, lives with spouse, was independent with activities until recently when has to use cane, PMH of CLL diagnosed in 2017, has been on Imbruvica since 2019, iron deficiency anemia, HTN, HLD, DM 2, stage III CKD, seen by Dr. Alen Blew, primary oncologist in office on 11/25/2018 when he reported approximately 4 weeks history of progressive weakness, fatigue, joint pains and weight loss, these were felt to be due to [place] which was held despite which she had progressively worsening anorexia, generalized weakness and presented to ED.  Admitted for acute kidney injury felt to be related to dehydration, hyperkalemia, hypercalcemia, abnormal LFTs and general failure to thrive.  Persisting acute kidney injury despite aggressive IV fluid hydration.  Nephrology consulted 3/23.  Oncology on board.   Assessment & Plan:   Principal Problem:   AKI (acute kidney injury) ([place]) Active Problems:   Type 2 diabetes mellitus with stage 3 chronic kidney disease, with long-term current use of insulin (HCC)   CLL (chronic lymphocytic leukemia) (HCC)   Lumbar compression fracture (HCC)   Hypercalcemia   Acute liver failure   1. Acute on stage III chronic kidney disease: Baseline creatinine probably in the 1.2 range (1.28 on 11/25/2018).  Presented with creatinine of 2.51.  Possibly due to dehydration from poor oral intake and hypercalcemia.  CT abdomen without hydronephrosis.  Patient reports taking some NSAIDs but unable to quantify.  Avoid nephrotoxic's.  No clinical urinary retention.  Check bladder scan.  Fena: 0.2% suggesting prerenal etiology.  However patient seems adequately hydrated.  Despite aggressive IV fluid hydration, creatinine remains at 2.5.  Nonoliguric, 800 mL UO  yesterday but had other unmeasured urine output as well.  Nephrology consulted for assistance on 3/23.  Continue IV fluids at 75 mL/h.  Imbruvica less likely etiology. 2. Dehydration with hyponatremia: Improved after IV fluids. 3. Hyperkalemia: Mild.  Resolved. 4. Hypercalcemia: Worsened by dehydration.  PCP worked up in February, PTH was low, 24-hour urine calcium was 174.  PTH RP and multiple myeloma panel pending.  Holding home vitamin D.  Corrected serum calcium 11.1 today.  Continue IV saline hydration.  Discussed with oncology on-call 3/22, suggested possible hypercalcemia of malignancy and recommended considering forced saline diuresis after hydration and may be nasal calcitonin > started nasal calcitonin 3/23.  Follow BMP. 5. Abnormal LFTs: Unclear etiology.  No GI symptoms.  These were mildly abnormal even on 3/10 and have gotten slightly worse. No focal liver abnormality seen on CT abdomen.?  Related to Imbruvica-discontinued 3/10.  Low index of suspicion for DIC or liver failure.  LFTs stable except total bilirubin slowly increased from 4 > 4.2 > 4.7.  Check split bilirubin in a.m.  Acute hepatitis panel-pending. 6. CLL/leukocytosis: Hemoglobin and leukocytosis stable.  I discussed with oncologist on call 3/22, recommends treating acute illness i.e. acute kidney injury and after stabilization, close outpatient follow-up with Dr. Alen Blew.  Avoiding prednisone due to uncontrolled DM2.  Dr. Hazeline Junker input appreciated.  He recommends urology consultation for possible obstructive uropathy from potential bladder outlet obstruction.  Will check bladder scan and consider urology consultation if patient has urinary retention otherwise urology consultation to be done as outpatient. 7. Uncontrolled type II DM with hyperglycemia: A1c 7.6 on 10/02/2018.  Continue Levemir 15 units  daily (30 units daily at home) and NovoLog SSI.  Monitor closely and adjust insulins as needed.  Hold metformin.  Controlled on current  doses of insulins. 8. Essential hypertension: Controlled.  Holding atenolol temporarily. 9. Hyperlipidemia: Hold statins due to abnormal LFTs. 10. Iron deficiency anemia: Stable. 11. Vertebral compression fractures: CT shows mild compression deformities of virtually all vertebral bodies of the lumbosacral spine, new compression deformity of T10-T12.  No pain reported.  Could be due to severe osteopenia versus metastatic disease.  Outpatient follow-up. 12. Prostatomegaly: Noted on CT. Please see discussion above regarding urology consultation. 13. Abdominopelvic ascites: Reportedly moderate on CT but clinically not as severe. 51. Adult failure to thrive: Multifactorial due to multiple severe significant comorbidities as above complicating advanced age.  Treat as above.  PT evaluation.  Dietitian consultation.  These consultations are pending.   DVT prophylaxis: Subcutaneous heparin Code Status: Full Family Communication: I discussed in detail with patient spouse, updated care and answered questions. Disposition: Home pending clinical improvement.   Consultants:  Medical oncology Nephrology  Procedures:  None  Antimicrobials:  None   Subjective: Patient states that he feels better.  Feels stronger and able to walk to bathroom.  Appetite improving.  Urinating well.  No dyspnea, dizziness or lightheadedness reported.  As per RN, no acute issues noted.  ROS: As above, otherwise negative  Objective:  Vitals:   12/07/18 0918 12/07/18 2018 12/08/18 0429 12/08/18 0851  BP: 122/63 105/61 118/61 121/60  Pulse: 94 94 99 96  Resp: '17 19 19 18  ' Temp: 98.5 F (36.9 C) 97.6 F (36.4 C) 97.9 F (36.6 C) 98.4 F (36.9 C)  TempSrc: Oral Oral Oral Oral  SpO2: 95% 96% 96% 96%  Weight:  70.8 kg    Height:        Examination:  General exam: Pleasant elderly male, moderately built and thinly nourished, frail and chronically ill looking lying comfortably propped up in bed.  Oral mucosa  moist. Respiratory system: Clear to auscultation. Respiratory effort normal.  Stable Cardiovascular system: S1 & S2 heard, RRR. No JVD, murmurs, rubs, gallops or clicks. No pedal edema.  Stable Gastrointestinal system: Abdomen is nondistended, soft and nontender. No organomegaly or masses felt. Normal bowel sounds heard.  No bladder palpable. Central nervous system: Alert and oriented. No focal neurological deficits.  Stable Extremities: Symmetric 5 x 5 power. Skin: No rashes, lesions or ulcers Psychiatry: Judgement and insight appear normal. Mood & affect appropriate.     Data Reviewed: I have personally reviewed following labs and imaging studies  CBC: Recent Labs  Lab 11/17/2018 2144 12/07/18 0333 12/08/18 0750  WBC 20.6* 20.4* 23.7*  NEUTROABS  --   --  11.1*  HGB 9.3* 8.8* 9.0*  HCT 26.8* 26.6* 26.0*  MCV 64.7* 65.2* 64.7*  PLT 393 390 920*   Basic Metabolic Panel: Recent Labs  Lab 11/29/2018 2144 12/07/18 0333 12/08/18 0750  NA 133* 129* 131*  K 5.4* 4.9 4.5  CL 103 101 104  CO2 19* 20* 18*  GLUCOSE 287* 241* 99  BUN 64* 63* 66*  CREATININE 2.51* 2.42* 2.51*  CALCIUM 10.0 9.4 9.3   Liver Function Tests: Recent Labs  Lab 12/07/2018 2144 12/07/18 0333 12/08/18 0750  AST 136* 130* 133*  ALT 82* 81* 88*  ALKPHOS 335* 320* 344*  BILITOT 4.0* 4.2* 4.7*  PROT 4.8* 4.6* 4.6*  ALBUMIN 1.8* 1.7* 1.7*   Coagulation Profile: Recent Labs  Lab 12/07/18 0333  INR 1.4*   CBG:  Recent Labs  Lab 12/07/18 0745 12/07/18 1152 12/07/18 1638 12/07/18 2020 12/08/18 0720  GLUCAP 224* 173* 119* 129* 84    No results found for this or any previous visit (from the past 240 hour(s)).       Radiology Studies: Ct Abdomen Pelvis Wo Contrast  Result Date: 12/07/2018 CLINICAL DATA:  Back pain. History of renal failure. EXAM: CT ABDOMEN AND PELVIS WITHOUT CONTRAST TECHNIQUE: Multidetector CT imaging of the abdomen and pelvis was performed following the standard protocol  without IV contrast. COMPARISON:  12/13/2017 FINDINGS: Lower chest: Hazy ground-glass opacities in bilateral lung bases. Bibasilar atelectasis. Small pericardial effusion. Calcific atherosclerotic disease of the aorta and coronary arteries. Hepatobiliary: No focal liver abnormality is seen. No gallstones, gallbladder wall thickening, or biliary dilatation. Pancreas: Unremarkable. No pancreatic ductal dilatation or surrounding inflammatory changes. Spleen: Normal in size without focal abnormality. Adrenals/Urinary Tract: Adrenal glands are unremarkable. Kidneys are normal, without renal calculi, focal lesion, or hydronephrosis. Diffuse thickening of the urinary bladder wall. Stomach/Bowel: Stomach is within normal limits. Appendix appears normal. No evidence of bowel wall thickening, distention, or inflammatory changes. Vascular/Lymphatic: Aortic atherosclerosis. No enlarged abdominal or pelvic lymph nodes. Reproductive: Enlargement of the prostate gland. Other: Moderate amount of abdominopelvic ascites. Ascites containing left inguinal hernia. Musculoskeletal: Diffusely mottled appearance of the osseous structures. Mild compression deformities of virtually all vertebral bodies of the lumbosacral spine and lower thoracic spine. Healing or healed right-sided rib fractures. IMPRESSION: 1. Moderate amount of abdominopelvic ascites. 2. Hazy ground-glass opacities in bilateral lung bases, likely infectious or inflammatory. 3. Small pericardial effusion. 4. Diffusely mottled appearance of the osseous structures with mild compression deformities of virtually all vertebral bodies of the lumbosacral spine, mildly progressed. New compression deformity of T10 and T12 vertebral bodies. These findings may represent severe osteopenia or diffuse osseous metastatic disease. 5. Enlargement of the prostate gland with mild diffuse thickening of the urinary bladder wall, possibly due to outlet obstruction. Please correlate to serum PSA  values. Electronically Signed   By: Fidela Salisbury M.D.   On: 12/07/2018 01:47   Dg Chest 2 View  Result Date: 11/23/2018 CLINICAL DATA:  Cough productive of gray brown mucus for 3 weeks, generalized weakness, nausea without vomiting, history type II diabetes mellitus, CLL, hypertension EXAM: CHEST - 2 VIEW COMPARISON:  03/26/2018 FINDINGS: Normal heart size, mediastinal contours, and pulmonary vascularity. Decreased lung volumes with bibasilar atelectasis slightly greater on LEFT. Central peribronchial thickening. Upper lungs clear. No pleural effusion or pneumothorax. Bones demineralized. IMPRESSION: Low lung volumes with bibasilar atelectasis and mild bronchitic changes. Electronically Signed   By: Lavonia Dana M.D.   On: 11/18/2018 22:32        Scheduled Meds:  heparin  5,000 Units Subcutaneous Q8H   insulin aspart  0-5 Units Subcutaneous QHS   insulin aspart  0-9 Units Subcutaneous TID WC   insulin detemir  15 Units Subcutaneous Daily   mirtazapine  15-30 mg Oral QHS   Continuous Infusions:  sodium chloride 125 mL/hr at 12/08/18 0832     LOS: 1 day     Vernell Leep, MD, FACP, [facility]. [facility]  To contact the attending provider between 7A-7P or the covering provider during after hours 7P-7A, please log into the web site [email or web address] and access using universal [place] password for that web site. If you do not have the password, please call the hospital operator.  12/08/2018, 10:21 AM

## 2018-12-08 NOTE — Consult Note (Addendum)
Reason for Consult: Renal failure Referring Physician:  Dr. Algis Liming  Chief Complaint:  Generalized weakness  Assessment/Plan: 1. Renal failure - pt is positive >1.5L during this hospitalization and renal function is stable but not improved. Certainly from his trends this is AKI with a creatinine of 1.28 on 11/25/2018. Would have thought this is prerenal azotemia vs progression to ATN and he has not improved much with hydration. No e/o obstruction; sometimes specific  tyrosine kinase inhibitors can cause tubular dysfunction. - I am going to repeat a CXR PA/lateral as he has coarse rales high up in posterior lung fields b/l; I wonder if this is fluid or ATX. Will also TKO IVF. - Will check a UPC 1st -> if high then will check a UAC as proximal tubular damage would be suggested if there is a discrepancy in various proteins in the urine. - He's not able to give me a urinary specimen at this time but I asked the pt and nurse to call me when a specimen is available; I would like to personally review a microscopy. - Less likely to be obstruction as no hydronephrosis present. 2. DM 3. CLL 4. HTN    HPI: UNDRA HARRIMAN is an 73 y.o. male CLL DM HTN CKD3 here with anorexia and weakness for a few days. Denies f/c/n/v/myalgias or sick contacts. Nonproductive cough but denies dyspnea or CP. Imbruvica was stopped on 3/10 bec the symptoms were thought to be side effects from [place]. Cr was 1.28 11/25/2018 and has fluctuated from 1.2-1.5 in 2019.  He has had very poor appetite for solids and fluids with malaise over the past 2-3 weeks even after the Imbruvica was discontinued. He denies obstructive like symptoms, diarrhea, rashes, hematuria or dysuria but has noticed decreased UOP over the same period of time. He uses NSAID's occasionally but last time was about a week ago 2 days in a row. He denies any lightheadedness or diplopia or any recent travel or alternative medications.  ROS Pertinent items are  noted in HPI.  Chemistry and CBC: Creatinine  Date/Time Value Ref Range Status  11/25/2018 03:27 PM 1.28 (H) 0.61 - 1.24 mg/dL Final  09/09/2018 09:35 AM 1.38 (H) 0.61 - 1.24 mg/dL Final  08/01/2018 12:50 PM 1.54 (H) 0.61 - 1.24 mg/dL Final  06/25/2018 08:46 AM 1.26 (H) 0.61 - 1.24 mg/dL Final  05/21/2018 10:49 AM 1.25 (H) 0.61 - 1.24 mg/dL Final  06/26/2017 08:13 AM 1.1 0.7 - 1.3 mg/dL Final  12/26/2016 08:06 AM 1.2 0.7 - 1.3 mg/dL Final  06/27/2016 12:34 PM 1.2 0.7 - 1.3 mg/dL Final  12/23/2015 02:43 PM 1.0 0.7 - 1.3 mg/dL Final   Creat  Date/Time Value Ref Range Status  10/02/2018 08:58 AM 1.45 (H) 0.70 - 1.18 mg/dL Final    Comment:    For patients >19 years of age, the reference limit for Creatinine is approximately 13% higher for people identified as African-American. Marland Kitchen   03/25/2018 03:47 PM 1.28 (H) 0.70 - 1.18 mg/dL Final    Comment:    For patients >43 years of age, the reference limit for Creatinine is approximately 13% higher for people identified as African-American. Marland Kitchen   01/22/2018 10:17 AM 1.16 0.70 - 1.18 mg/dL Final    Comment:    For patients >33 years of age, the reference limit for Creatinine is approximately 13% higher for people identified as African-American. Marland Kitchen   10/16/2017 11:30 AM 1.15 0.70 - 1.18 mg/dL Final    Comment:    For  patients >46 years of age, the reference limit for Creatinine is approximately 13% higher for people identified as African-American. Marland Kitchen   06/10/2017 10:43 AM 1.20 (H) 0.70 - 1.18 mg/dL Final    Comment:    For patients >12 years of age, the reference limit for Creatinine is approximately 13% higher for people identified as African-American. Marland Kitchen   01/09/2017 10:02 AM 1.27 (H) 0.70 - 1.18 mg/dL Final    Comment:      For patients > or = 74 years of age: The upper reference limit for Creatinine is approximately 13% higher for people identified as African-American.     10/09/2016 10:21 AM 1.22 (H) 0.70 - 1.18 mg/dL  Final    Comment:      For patients > or = 74 years of age: The upper reference limit for Creatinine is approximately 13% higher for people identified as African-American.     05/30/2016 09:59 AM 1.08 0.70 - 1.18 mg/dL Final    Comment:      For patients > or = 74 years of age: The upper reference limit for Creatinine is approximately 13% higher for people identified as African-American.     02/23/2016 11:36 AM 0.99 0.70 - 1.18 mg/dL Final  11/15/2015 01:02 PM 1.09 0.70 - 1.18 mg/dL Final  07/06/2015 11:40 AM 1.05 0.70 - 1.25 mg/dL Final  03/16/2015 09:35 AM 0.97 0.50 - 1.35 mg/dL Final  12/07/2014 10:45 AM 0.98 0.50 - 1.35 mg/dL Final  08/11/2014 11:36 AM 1.01 0.50 - 1.35 mg/dL Final  03/31/2014 09:10 AM 1.09 0.50 - 1.35 mg/dL Final  12/09/2013 11:14 AM 0.98 0.50 - 1.35 mg/dL Final  08/27/2013 11:10 AM 1.17 0.50 - 1.35 mg/dL Final  07/22/2013 02:37 PM 1.03 0.50 - 1.35 mg/dL Final   Creatinine, Ser  Date/Time Value Ref Range Status  12/08/2018 07:50 AM 2.51 (H) 0.61 - 1.24 mg/dL Final  12/07/2018 03:33 AM 2.42 (H) 0.61 - 1.24 mg/dL Final  12/04/2018 09:44 PM 2.51 (H) 0.61 - 1.24 mg/dL Final  12/31/2017 09:57 AM 1.17 0.61 - 1.24 mg/dL Final   Recent Labs  Lab 12/04/2018 2144 12/07/18 0333 12/08/18 0750  NA 133* 129* 131*  K 5.4* 4.9 4.5  CL 103 101 104  CO2 19* 20* 18*  GLUCOSE 287* 241* 99  BUN 64* 63* 66*  CREATININE 2.51* 2.42* 2.51*  CALCIUM 10.0 9.4 9.3   Recent Labs  Lab 12/09/2018 2144 12/07/18 0333 12/08/18 0750  WBC 20.6* 20.4* 23.7*  NEUTROABS  --   --  11.1*  HGB 9.3* 8.8* 9.0*  HCT 26.8* 26.6* 26.0*  MCV 64.7* 65.2* 64.7*  PLT 393 390 431*   Liver Function Tests: Recent Labs  Lab 11/23/2018 2144 12/07/18 0333 12/08/18 0750  AST 136* 130* 133*  ALT 82* 81* 88*  ALKPHOS 335* 320* 344*  BILITOT 4.0* 4.2* 4.7*  PROT 4.8* 4.6* 4.6*  ALBUMIN 1.8* 1.7* 1.7*   Recent Labs  Lab 11/27/2018 2144  LIPASE 38   Recent Labs  Lab 12/07/18 0333   AMMONIA 43*   Cardiac Enzymes: No results for input(s): CKTOTAL, CKMB, CKMBINDEX, TROPONINI in the last 168 hours. Iron Studies: No results for input(s): IRON, TIBC, TRANSFERRIN, FERRITIN in the last 72 hours. PT/INR: @LABRCNTIP (inr:5)  Xrays/Other Studies: ) Results for orders placed or performed during the hospital encounter of 12/11/2018 (from the past 48 hour(s))  CBC     Status: Abnormal   Collection Time: 12/16/2018  9:44 PM  Result Value Ref Range   WBC  20.6 (H) 4.0 - 10.5 K/uL   RBC 4.14 (L) 4.22 - 5.81 MIL/uL   Hemoglobin 9.3 (L) 13.0 - 17.0 g/dL   HCT 26.8 (L) 39.0 - 52.0 %   MCV 64.7 (L) 80.0 - 100.0 fL   MCH 22.5 (L) 26.0 - 34.0 pg   MCHC 34.7 30.0 - 36.0 g/dL   RDW 22.5 (H) 11.5 - 15.5 %   Platelets 393 150 - 400 K/uL    Comment: REPEATED TO VERIFY   nRBC 0.0 0.0 - 0.2 %    Comment: Performed at [facility], [place] [address], [place], [place] [address]  Comprehensive metabolic panel     Status: Abnormal   Collection Time: 12/13/2018  9:44 PM  Result Value Ref Range   Sodium 133 (L) 135 - 145 mmol/L   Potassium 5.4 (H) 3.5 - 5.1 mmol/L   Chloride 103 98 - 111 mmol/L   CO2 19 (L) 22 - 32 mmol/L   Glucose, Bld 287 (H) 70 - 99 mg/dL   BUN 64 (H) 8 - 23 mg/dL   Creatinine, Ser 2.51 (H) 0.61 - 1.24 mg/dL   Calcium 10.0 8.9 - 10.3 mg/dL   Total Protein 4.8 (L) 6.5 - 8.1 g/dL   Albumin 1.8 (L) 3.5 - 5.0 g/dL   AST 136 (H) 15 - 41 U/L   ALT 82 (H) 0 - 44 U/L   Alkaline Phosphatase 335 (H) 38 - 126 U/L   Total Bilirubin 4.0 (H) 0.3 - 1.2 mg/dL   GFR calc non Af Amer 24 (L) >60 mL/min   GFR calc Af Amer 28 (L) >60 mL/min   Anion gap 11 5 - 15    Comment: Performed at [facility], [place] [address], [place], [place] [address]  Lipase, blood     Status: None   Collection Time: 12/05/2018  9:44 PM  Result Value Ref Range   Lipase 38 11 - 51 U/L    Comment: Performed at [facility], [place] [address] [place], [place] [address]  Influenza panel by PCR (type A  & B)     Status: None   Collection Time: 11/19/2018 10:26 PM  Result Value Ref Range   Influenza A By PCR NEGATIVE NEGATIVE   Influenza B By PCR NEGATIVE NEGATIVE    Comment: (NOTE) The Xpert Xpress Flu assay is intended as an aid in the diagnosis of  influenza and should not be used as a sole basis for treatment.  This  assay is FDA approved for nasopharyngeal swab specimens only. Nasal  washings and aspirates are unacceptable for Xpert Xpress Flu testing. Performed at [facility], [place] [address], [place], [place] [address]   Ammonia     Status: Abnormal   Collection Time: 12/07/18  3:33 AM  Result Value Ref Range   Ammonia 43 (H) 9 - 35 umol/L    Comment: Performed at [facility], [place] [address] [place],  [address]  Comprehensive metabolic panel     Status: Abnormal   Collection Time: 12/07/18  3:33 AM  Result Value Ref Range   Sodium 129 (L) 135 - 145 mmol/L   Potassium 4.9 3.5 - 5.1 mmol/L   Chloride 101 98 - 111 mmol/L   CO2 20 (L) 22 - 32 mmol/L   Glucose, Bld 241 (H) 70 - 99 mg/dL   BUN 63 (H) 8 - 23 mg/dL   Creatinine, Ser 2.42 (H) 0.61 - 1.24 mg/dL  Calcium 9.4 8.9 - 10.3 mg/dL   Total Protein 4.6 (L) 6.5 - 8.1 g/dL   Albumin 1.7 (L) 3.5 - 5.0 g/dL   AST 130 (H) 15 - 41 U/L   ALT 81 (H) 0 - 44 U/L   Alkaline Phosphatase 320 (H) 38 - 126 U/L   Total Bilirubin 4.2 (H) 0.3 - 1.2 mg/dL   GFR calc non Af Amer 26 (L) >60 mL/min   GFR calc Af Amer 30 (L) >60 mL/min   Anion gap 8 5 - 15    Comment: Performed at [facility] [address] [place], [place] [address]  Protime-INR     Status: Abnormal   Collection Time: 12/07/18  3:33 AM  Result Value Ref Range   Prothrombin Time 17.3 (H) 11.4 - 15.2 seconds   INR 1.4 (H) 0.8 - 1.2    Comment: REPEATED TO VERIFY (NOTE) INR goal varies based on device and disease states. Performed at [facility], [place] [address] [place], [place] [address]   Lactate dehydrogenase     Status: Abnormal    Collection Time: 12/07/18  3:33 AM  Result Value Ref Range   LDH 335 (H) 98 - 192 U/L    Comment: Performed at [facility], [place] [address] [place], [place] [address]  Lipid panel     Status: Abnormal   Collection Time: 12/07/18  3:33 AM  Result Value Ref Range   Cholesterol 146 0 - 200 mg/dL   Triglycerides 211 (H) <150 mg/dL   HDL 10 (L) >40 mg/dL   Total CHOL/HDL Ratio 14.6 RATIO   VLDL 42 (H) 0 - 40 mg/dL   LDL Cholesterol 94 0 - 99 mg/dL    Comment:        Total Cholesterol/HDL:CHD Risk Coronary Heart Disease Risk Table                     Men   Women  1/2 Average Risk   3.4   3.3  Average Risk       5.0   4.4  2 X Average Risk   9.6   7.1  3 X Average Risk  23.4   11.0        Use the calculated Patient Ratio above and the CHD Risk Table to determine the patient's CHD Risk.        ATP III CLASSIFICATION (LDL):  <100     mg/dL   Optimal  100-129  mg/dL   Near or Above                    Optimal  130-159  mg/dL   Borderline  160-189  mg/dL   High  >190     mg/dL   Very High Performed at [facility] [address] [place], [place] [address]   APTT     Status: None   Collection Time: 12/07/18  3:33 AM  Result Value Ref Range   aPTT 34 24 - 36 seconds    Comment: Performed at [facility] [address] [place], [place] [address]  Fibrinogen     Status: Abnormal   Collection Time: 12/07/18  3:33 AM  Result Value Ref Range   Fibrinogen 177 (L) 210 - 475 mg/dL    Comment: REPEATED TO VERIFY Performed at [facility] [address] [place],  [address]   D-dimer, quantitative (not at [facility])  Status: Abnormal   Collection Time: 12/07/18  3:33 AM  Result Value Ref Range   D-Dimer, Quant 9.49 (H) 0.00 - 0.50 ug/mL-FEU    Comment: REPEATED TO VERIFY (NOTE) At the manufacturer cut-off of 0.50 ug/mL FEU, this assay has been documented to exclude PE with a sensitivity and negative predictive value of 97 to 99%.  At this time,  this assay has not been approved by the FDA to exclude DVT/VTE. Results should be correlated with clinical presentation. Performed at [facility], [place] [address] [place], [place] [address]   CBC     Status: Abnormal   Collection Time: 12/07/18  3:33 AM  Result Value Ref Range   WBC 20.4 (H) 4.0 - 10.5 K/uL   RBC 4.08 (L) 4.22 - 5.81 MIL/uL   Hemoglobin 8.8 (L) 13.0 - 17.0 g/dL    Comment: Reticulocyte Hemoglobin testing may be clinically indicated, consider ordering this additional test [id number]    HCT 26.6 (L) 39.0 - 52.0 %   MCV 65.2 (L) 80.0 - 100.0 fL   MCH 21.6 (L) 26.0 - 34.0 pg   MCHC 33.1 30.0 - 36.0 g/dL   RDW 22.4 (H) 11.5 - 15.5 %   Platelets 390 150 - 400 K/uL    Comment: REPEATED TO VERIFY   nRBC 0.0 0.0 - 0.2 %    Comment: Performed at [facility], [place] [address], [place], [place] [address]  Urinalysis, Routine w reflex microscopic     Status: Abnormal   Collection Time: 12/07/18  5:44 AM  Result Value Ref Range   Color, Urine AMBER (A) YELLOW    Comment: BIOCHEMICALS MAY BE AFFECTED BY COLOR   APPearance HAZY (A) CLEAR   Specific Gravity, Urine 1.026 1.005 - 1.030   pH 5.0 5.0 - 8.0   Glucose, UA NEGATIVE NEGATIVE mg/dL   Hgb urine dipstick MODERATE (A) NEGATIVE   Bilirubin Urine NEGATIVE NEGATIVE   Ketones, ur NEGATIVE NEGATIVE mg/dL   Protein, ur 100 (A) NEGATIVE mg/dL   Nitrite NEGATIVE NEGATIVE   Leukocytes,Ua NEGATIVE NEGATIVE   RBC / HPF 0-5 0 - 5 RBC/hpf   WBC, UA 6-10 0 - 5 WBC/hpf   Bacteria, UA RARE (A) NONE SEEN   Squamous Epithelial / LPF 0-5 0 - 5   Mucus PRESENT    Granular Casts, UA PRESENT    Amorphous Crystal PRESENT     Comment: Performed at [facility], [address], [place], [place] [address]  Creatinine, urine, random     Status: None   Collection Time: 12/07/18  5:44 AM  Result Value Ref Range   Creatinine, Urine 143.52 mg/dL    Comment: Performed at [facility] [address], [place],  [place] [address]  Na and K (sodium & potassium), rand urine     Status: None   Collection Time: 12/07/18  5:44 AM  Result Value Ref Range   Sodium, Ur 16 mmol/L   Potassium Urine 66 mmol/L    Comment: Performed at [facility] [address] [place], [place] [address]  Glucose, capillary     Status: Abnormal   Collection Time: 12/07/18  7:45 AM  Result Value Ref Range   Glucose-Capillary 224 (H) 70 - 99 mg/dL  Glucose, capillary     Status: Abnormal   Collection Time: 12/07/18 11:52 AM  Result Value Ref Range   Glucose-Capillary 173 (H) 70 - 99 mg/dL  Glucose, capillary  Status: Abnormal   Collection Time: 12/07/18  4:38 PM  Result Value Ref Range   Glucose-Capillary 119 (H) 70 - 99 mg/dL  Glucose, capillary     Status: Abnormal   Collection Time: 12/07/18  8:20 PM  Result Value Ref Range   Glucose-Capillary 129 (H) 70 - 99 mg/dL  Glucose, capillary     Status: None   Collection Time: 12/08/18  7:20 AM  Result Value Ref Range   Glucose-Capillary 84 70 - 99 mg/dL  CBC with Differential/Platelet     Status: Abnormal   Collection Time: 12/08/18  7:50 AM  Result Value Ref Range   WBC 23.7 (H) 4.0 - 10.5 K/uL   RBC 4.02 (L) 4.22 - 5.81 MIL/uL   Hemoglobin 9.0 (L) 13.0 - 17.0 g/dL   HCT 26.0 (L) 39.0 - 52.0 %   MCV 64.7 (L) 80.0 - 100.0 fL   MCH 22.4 (L) 26.0 - 34.0 pg   MCHC 34.6 30.0 - 36.0 g/dL   RDW 22.4 (H) 11.5 - 15.5 %   Platelets 431 (H) 150 - 400 K/uL    Comment: REPEATED TO VERIFY   nRBC 0.0 0.0 - 0.2 %   Neutrophils Relative % 46 %   Neutro Abs 11.1 (H) 1.7 - 7.7 K/uL   Lymphocytes Relative 36 %   Lymphs Abs 8.5 (H) 0.7 - 4.0 K/uL   Monocytes Relative 10 %   Monocytes Absolute 2.3 (H) 0.1 - 1.0 K/uL   Eosinophils Relative 2 %   Eosinophils Absolute 0.5 0.0 - 0.5 K/uL   Basophils Relative 1 %   Basophils Absolute 0.2 (H) 0.0 - 0.1 K/uL   WBC Morphology See Note     Comment: Mild Left Shift. 1 to 5% Metas and Myelos, Occ Pro Noted.  >10% reactive, Benign  Lymphocytes.    Immature Granulocytes 5 %   Abs Immature Granulocytes 1.21 (H) 0.00 - 0.07 K/uL   Acanthocytes PRESENT    Burr Cells PRESENT    Polychromasia PRESENT    Target Cells PRESENT     Comment: Performed at [facility], [place] [address] [place], [place] [address]  Comprehensive metabolic panel     Status: Abnormal   Collection Time: 12/08/18  7:50 AM  Result Value Ref Range   Sodium 131 (L) 135 - 145 mmol/L   Potassium 4.5 3.5 - 5.1 mmol/L   Chloride 104 98 - 111 mmol/L   CO2 18 (L) 22 - 32 mmol/L   Glucose, Bld 99 70 - 99 mg/dL   BUN 66 (H) 8 - 23 mg/dL   Creatinine, Ser 2.51 (H) 0.61 - 1.24 mg/dL   Calcium 9.3 8.9 - 10.3 mg/dL   Total Protein 4.6 (L) 6.5 - 8.1 g/dL   Albumin 1.7 (L) 3.5 - 5.0 g/dL   AST 133 (H) 15 - 41 U/L   ALT 88 (H) 0 - 44 U/L   Alkaline Phosphatase 344 (H) 38 - 126 U/L   Total Bilirubin 4.7 (H) 0.3 - 1.2 mg/dL   GFR calc non Af Amer 24 (L) >60 mL/min   GFR calc Af Amer 28 (L) >60 mL/min   Anion gap 9 5 - 15    Comment: Performed at [facility], [place] [address], [place], [place] [address]   Ct Abdomen Pelvis Wo Contrast  Result Date: 12/07/2018 CLINICAL DATA:  Back pain. History of renal failure. EXAM: CT ABDOMEN AND PELVIS WITHOUT CONTRAST TECHNIQUE: Multidetector CT imaging of the abdomen and  pelvis was performed following the standard protocol without IV contrast. COMPARISON:  12/13/2017 FINDINGS: Lower chest: Hazy ground-glass opacities in bilateral lung bases. Bibasilar atelectasis. Small pericardial effusion. Calcific atherosclerotic disease of the aorta and coronary arteries. Hepatobiliary: No focal liver abnormality is seen. No gallstones, gallbladder wall thickening, or biliary dilatation. Pancreas: Unremarkable. No pancreatic ductal dilatation or surrounding inflammatory changes. Spleen: Normal in size without focal abnormality. Adrenals/Urinary Tract: Adrenal glands are unremarkable. Kidneys are normal, without renal calculi,  focal lesion, or hydronephrosis. Diffuse thickening of the urinary bladder wall. Stomach/Bowel: Stomach is within normal limits. Appendix appears normal. No evidence of bowel wall thickening, distention, or inflammatory changes. Vascular/Lymphatic: Aortic atherosclerosis. No enlarged abdominal or pelvic lymph nodes. Reproductive: Enlargement of the prostate gland. Other: Moderate amount of abdominopelvic ascites. Ascites containing left inguinal hernia. Musculoskeletal: Diffusely mottled appearance of the osseous structures. Mild compression deformities of virtually all vertebral bodies of the lumbosacral spine and lower thoracic spine. Healing or healed right-sided rib fractures. IMPRESSION: 1. Moderate amount of abdominopelvic ascites. 2. Hazy ground-glass opacities in bilateral lung bases, likely infectious or inflammatory. 3. Small pericardial effusion. 4. Diffusely mottled appearance of the osseous structures with mild compression deformities of virtually all vertebral bodies of the lumbosacral spine, mildly progressed. New compression deformity of T10 and T12 vertebral bodies. These findings may represent severe osteopenia or diffuse osseous metastatic disease. 5. Enlargement of the prostate gland with mild diffuse thickening of the urinary bladder wall, possibly due to outlet obstruction. Please correlate to serum PSA values. Electronically Signed   By: Fidela Salisbury M.D.   On: 12/07/2018 01:47   Dg Chest 2 View  Result Date: 11/17/2018 CLINICAL DATA:  Cough productive of gray brown mucus for 3 weeks, generalized weakness, nausea without vomiting, history type II diabetes mellitus, CLL, hypertension EXAM: CHEST - 2 VIEW COMPARISON:  03/26/2018 FINDINGS: Normal heart size, mediastinal contours, and pulmonary vascularity. Decreased lung volumes with bibasilar atelectasis slightly greater on LEFT. Central peribronchial thickening. Upper lungs clear. No pleural effusion or pneumothorax. Bones  demineralized. IMPRESSION: Low lung volumes with bibasilar atelectasis and mild bronchitic changes. Electronically Signed   By: Lavonia Dana M.D.   On: 11/25/2018 22:32    PMH:   Past Medical History:  Diagnosis Date  . Cataract   . CLL (chronic lymphocytic leukemia) ([facility])    not treated yet  . Colon polyps   . Diabetes ([facility]) 1992   type 2  . ED (erectile dysfunction)   . Femur fracture ([facility])    left  . Heart murmur   . History of hiatal hernia   . Iron (Fe) deficiency anemia   . Obesity   . Other and unspecified hyperlipidemia   . Premature beats, unspecified   . Unspecified essential hypertension   . Vitamin D deficiency     PSH:   Past Surgical History:  Procedure Laterality Date  . ETT - MYOVIEW     7'45", stopped due to fatigue, no chest pain. EF55%, no ischemia or infarction  . HERNIA REPAIR     hiatal hernia  1994 , inguinal scheduled 01-02-18 with dr. Coralie Keens  . INGUINAL HERNIA REPAIR Bilateral 02/06/2018   Procedure: LAPAROSCOPIC BILATERAL INGUINAL HERNIA REPAIR;  Surgeon: Coralie Keens, MD;  Location: [facility];  Service: General;  Laterality: Bilateral;  . INSERTION OF MESH Bilateral 02/06/2018   Procedure: INSERTION OF MESH;  Surgeon: Coralie Keens, MD;  Location: [facility];  Service: General;  Laterality: Bilateral;  .  nissan fundoplication N/A 16/06/9603    Allergies:  Allergies  Allergen Reactions  . Amaryl [Glimepiride] Diarrhea  . Codeine Itching  . Pioglitazone Diarrhea, Itching and Anxiety    Medications:   Prior to Admission medications   Medication Sig Start Date End Date Taking? Authorizing Provider  acetaminophen (TYLENOL) 500 MG tablet Take 1,000 mg by mouth every 6 (six) hours as needed for moderate pain or headache.   Yes [provider]  aspirin 81 MG tablet Take 81 mg by mouth daily.   Yes [provider]  atenolol (TENORMIN) 25 MG tablet Take 0.5 tablets (12.5 mg total) by  mouth at bedtime. 10/02/18  Yes Liane Comber, NP  atorvastatin (LIPITOR) 80 MG tablet TAKE 1 TABLET A DAY Patient taking differently: Take 80 mg by mouth daily at 6 PM.  07/07/18  Yes [name], MD  Cholecalciferol (VITAMIN D3) 1000 units CAPS Take 2,000 Units by mouth daily.   Yes [provider]  LEVEMIR FLEXTOUCH 100 UNIT/ML Pen INJECT 30 UNITS INTO THE SKIN DAILY Patient taking differently: Inject 30 Units into the skin daily.  05/16/18  Yes [name], MD  loratadine (CLARITIN) 10 MG tablet Take 1 tablet daily for Allergies Patient taking differently: Take 10 mg by mouth daily as needed for allergies.  09/18/17  Yes Vicie Mutters, PA-C  Magnesium 250 MG TABS Take 1 tablet (250 mg total) by mouth 2 (two) times daily with a meal. 10/03/18  Yes Liane Comber, NP  metFORMIN (GLUCOPHAGE) 1000 MG tablet TAKE ONE TABLET BY MOUTH TWICE A DAY Patient taking differently: Take 1,000 mg by mouth 2 (two) times daily with a meal.  07/14/18  Yes [name], MD  mirtazapine (REMERON) 30 MG tablet Take 1/2 to 1 tablet 1 hour before bedtime  for appetite & sleep Patient taking differently: Take 30 mg by mouth at bedtime.  03/25/18  Yes [name], MD  Tetrahydrozoline HCl (VISINE OP) Place 2 drops into both eyes daily as needed (for dryness).    Yes [provider]  IMBRUVICA 420 MG TABS TAKE 1 TABLET (420 MG) BY MOUTH DAILY. TAKE WITH A GLASS OF WATER AT APPROX THE SAME TIME EACH DAY. Patient not taking: TAKE WITH A GLASS OF WATER AT APPROX THE SAME TIME EACH DAY 10/13/18   Wyatt Portela, MD  Insulin Pen Needle (B-D ULTRAFINE III SHORT PEN) 31G X 8 MM MISC USE DAILY WITH LEVEMIR. [id number]. 05/28/18   [name], MD    Discontinued Meds:   Medications Discontinued During This Encounter  Medication Reason  . insulin aspart (novoLOG) injection 0-15 Units   . insulin aspart (novoLOG) injection 0-9 Units   . 0.9 %  sodium chloride infusion   . 0.9 %  sodium  chloride infusion     Social History:  reports that he has never smoked. He has never used smokeless tobacco. He reports that he does not drink alcohol or use drugs.  Family History:   Family History  Problem Relation Age of Onset  . Throat cancer Mother   . Alzheimer's disease Father   . Hypertension Other        family history  . Heart attack Other 79    Blood pressure 121/60, pulse 96, temperature 98.4 F (36.9 C), temperature source Oral, resp. rate 18, height 5\' 8"  (1.727 m), weight 70.8 kg, SpO2 96 %. General appearance: alert, cooperative and appears stated age Head: Normocephalic, without obvious abnormality, atraumatic Eyes: negative Neck: no adenopathy, no  carotid bruit, supple, symmetrical, trachea midline and thyroid not enlarged, symmetric, no tenderness/mass/nodules Back: symmetric, no curvature. ROM normal. No CVA tenderness. Resp: rales bibasilar and bilaterally Chest wall: no tenderness Cardio: regular rate and rhythm, S1, S2 normal, no murmur, click, rub or gallop GI: soft, non-tender; bowel sounds normal; no masses,  no organomegaly Extremities: edema 1+ pretib Pulses: 2+ and symmetric Skin: Skin color, texture, turgor normal. No rashes or lesions Lymph nodes: Cervical, supraclavicular, and axillary nodes normal. Neurologic: Grossly normal       Werner Labella, Hunt Oris, MD 12/08/2018, 11:28 AM

## 2018-12-08 NOTE — Progress Notes (Signed)
Events noted over the last 24 hours.  Mr. Nathan Davis was hospitalized for failure to thrive and acute kidney injury.  He was started on aggressive hydration and supportive management.  He is known to me with history of CLL with a therapy on hold since March 10 of 2020.  Labs on admission were reviewed and shows slight increase in his LDH, slight elevation in his transaminases and bilirubin.  CT scan of the abdomen and pelvis on December 07, 2018 was also reviewed did not show any acute findings.  He does have enlarged prostate with possible bladder outlet obstruction.  At this time, I recommend continued supportive management as you are doing.  I recommended urological evaluation for possible obstructive uropathy given his persistent renal insufficiency and potential bladder outlet obstruction.  I will continue to follow his clinical status and laboratory data while he is hospitalized.

## 2018-12-08 NOTE — Progress Notes (Signed)
  Echocardiogram 2D Echocardiogram has been performed.  Nathan Davis G Kinleigh Nault 12/08/2018, 5:00 PM

## 2018-12-08 NOTE — Evaluation (Signed)
Physical Therapy Evaluation Patient Details Name: Nathan Davis MRN: [id number] DOB: [birth year] Today's Date: 12/08/2018   History of Present Illness  Pt is a 73 y/o male with a PMH significant for HTN, anemia, history of hernia x2 s/p repair x2, DM II, CLL. Pt presents with progressive weakness over ~4 weeks and reportedly was independent PTA. Imaging revealed mild compression in virtually all vertebral bodies, with new compression deformity in T10-T12 (osteopenia vs metastatic).    Clinical Impression  Pt admitted with above diagnosis. Pt currently with functional limitations due to the deficits listed below (see PT Problem List). At the time of PT eval pt was able to perform transfers and ambulation with up to mod assist for balance support, safety, and management of RW. Pt reports his wife is limited in the amount of physical assistance she is able to provide, and does not mention any other family/friends that would be available to assist him upon d/c. Recommending continued rehab at the SNF level to maximize functional independence and safety prior to return home with wife. Acutely, pt will benefit from skilled PT to increase their independence and safety with mobility to allow discharge to the venue listed below.       Follow Up Recommendations SNF;Supervision/Assistance - 24 hour    Equipment Recommendations  Rolling walker with 5" wheels;3in1 (PT)    Recommendations for Other Services       Precautions / Restrictions Precautions Precautions: Fall Precaution Comments: Pt reports a fall of of his porch an unspecified time ago (sounded recent?) Restrictions Weight Bearing Restrictions: No      Mobility  Bed Mobility Overal bed mobility: Needs Assistance Bed Mobility: Supine to Sit;Sit to Supine     Supine to sit: Min guard Sit to supine: Min assist   General bed mobility comments: Assist to elevate LE's back up into bed.   Transfers Overall transfer level: Needs  assistance Equipment used: Rolling walker (2 wheeled) Transfers: Sit to/from Stand Sit to Stand: Mod assist         General transfer comment: Heavy mod assist to power-up to full standing position. VC's for hand placement on seated surface for safety.   Ambulation/Gait Ambulation/Gait assistance: Min assist Gait Distance (Feet): 25 Feet Assistive device: Rolling walker (2 wheeled) Gait Pattern/deviations: Decreased stride length;Shuffle;Trunk flexed;Narrow base of support Gait velocity: Decreased Gait velocity interpretation: 1.31 - 2.62 ft/sec, indicative of limited community ambulator General Gait Details: VC's for improved posture and forward gaze. Pt taking very small, shuffling steps and fatiguing quickly. Consistent min assist provided for balance support as well as walker management.   Stairs            Wheelchair Mobility    Modified Rankin (Stroke Patients Only)       Balance Overall balance assessment: Needs assistance Sitting-balance support: Feet supported;No upper extremity supported Sitting balance-Leahy Scale: Fair     Standing balance support: No upper extremity supported Standing balance-Leahy Scale: Poor Standing balance comment: Reliant on UE support throughout gait training.                             Pertinent Vitals/Pain Pain Assessment: No/denies pain    Home Living Family/patient expects to be discharged to:: Private residence Living Arrangements: Spouse/significant other Available Help at Discharge: Family;Available 24 hours/day Type of Home: House Home Access: Stairs to enter   [employer] of Steps: 12 Home Layout: Two level;Bed/bath upstairs Home Equipment: Kasandra Knudsen -  single point      Prior Function Level of Independence: Needs assistance   Gait / Transfers Assistance Needed: Using a cane for balance  ADL's / Homemaking Assistance Needed: Wife assisting with ADL's - pt reporting he has not attempted to get  in the shower in over a week due to feeling weak.         Hand Dominance        Extremity/Trunk Assessment   Upper Extremity Assessment Upper Extremity Assessment: Generalized weakness    Lower Extremity Assessment Lower Extremity Assessment: Generalized weakness    Cervical / Trunk Assessment Cervical / Trunk Assessment: Kyphotic;Other exceptions Cervical / Trunk Exceptions: Forward head posture with rounded shoulders  Communication      Cognition Arousal/Alertness: Awake/alert Behavior During Therapy: WFL for tasks assessed/performed Overall Cognitive Status: Within Functional Limits for tasks assessed                                        General Comments      Exercises     Assessment/Plan    PT Assessment Patient needs continued PT services  PT Problem List Decreased strength;Decreased activity tolerance;Decreased balance;Decreased mobility;Decreased knowledge of use of DME;Decreased safety awareness;Decreased knowledge of precautions       PT Treatment Interventions DME instruction;Gait training;Stair training;Functional mobility training;Therapeutic activities;Therapeutic exercise;Neuromuscular re-education;Patient/family education    PT Goals (Current goals can be found in the Care Plan section)  Acute Rehab PT Goals Patient Stated Goal: Return home PT Goal Formulation: With patient Time For Goal Achievement: 12/22/18 Potential to Achieve Goals: Good    Frequency Min 2X/week   Barriers to discharge Decreased caregiver support Pt reports his wife is limited in the amount of physical assist she can provide. At this time pt is requiring heavy mod A to complete transfers.     Co-evaluation               AM-PAC PT "6 Clicks" Mobility  Outcome Measure Help needed turning from your back to your side while in a flat bed without using bedrails?: None Help needed moving from lying on your back to sitting on the side of a flat bed  without using bedrails?: A Little Help needed moving to and from a bed to a chair (including a wheelchair)?: A Lot Help needed standing up from a chair using your arms (e.g., wheelchair or bedside chair)?: A Lot Help needed to walk in hospital room?: A Little Help needed climbing 3-5 steps with a railing? : Total 6 Click Score: 15    End of Session Equipment Utilized During Treatment: Gait belt Activity Tolerance: Patient limited by fatigue Patient left: in bed;with call bell/phone within reach;with bed alarm set Nurse Communication: Mobility status PT Visit Diagnosis: Unsteadiness on feet (R26.81);Muscle weakness (generalized) (M62.81);Difficulty in walking, not elsewhere classified (R26.2)    Time: 1440-1510 PT Time Calculation (min) (ACUTE ONLY): 30 min   Charges:   PT Evaluation $PT Eval Moderate Complexity: 1 Mod PT Treatments $Gait Training: 8-22 mins        [name], PT, DPT Acute Rehabilitation Services Pager: [phone] Office: [phone]   [name] 12/08/2018, 3:32 PM

## 2018-12-09 LAB — HEPATIC FUNCTION PANEL
ALT: 80 U/L — ABNORMAL HIGH (ref 0–44)
AST: 116 U/L — ABNORMAL HIGH (ref 15–41)
Albumin: 1.6 g/dL — ABNORMAL LOW (ref 3.5–5.0)
Alkaline Phosphatase: 346 U/L — ABNORMAL HIGH (ref 38–126)
Bilirubin, Direct: 3.5 mg/dL — ABNORMAL HIGH (ref 0.0–0.2)
Indirect Bilirubin: 1.5 mg/dL — ABNORMAL HIGH (ref 0.3–0.9)
Total Bilirubin: 5 mg/dL — ABNORMAL HIGH (ref 0.3–1.2)
Total Protein: 4.5 g/dL — ABNORMAL LOW (ref 6.5–8.1)

## 2018-12-09 LAB — CBC
HCT: 26.5 % — ABNORMAL LOW (ref 39.0–52.0)
Hemoglobin: 9 g/dL — ABNORMAL LOW (ref 13.0–17.0)
MCH: 21.5 pg — ABNORMAL LOW (ref 26.0–34.0)
MCHC: 34 g/dL (ref 30.0–36.0)
MCV: 63.4 fL — ABNORMAL LOW (ref 80.0–100.0)
Platelets: 387 10*3/uL (ref 150–400)
RBC: 4.18 MIL/uL — ABNORMAL LOW (ref 4.22–5.81)
RDW: 22.5 % — ABNORMAL HIGH (ref 11.5–15.5)
WBC: 24 10*3/uL — ABNORMAL HIGH (ref 4.0–10.5)
nRBC: 0 % (ref 0.0–0.2)

## 2018-12-09 LAB — PROTEIN / CREATININE RATIO, URINE
Creatinine, Urine: 139.33 mg/dL
Protein Creatinine Ratio: 11.89 mg/mg{Cre} — ABNORMAL HIGH (ref 0.00–0.15)
Total Protein, Urine: 1656 mg/dL

## 2018-12-09 LAB — MULTIPLE MYELOMA PANEL, SERUM
Albumin SerPl Elph-Mcnc: 2 g/dL — ABNORMAL LOW (ref 2.9–4.4)
Albumin/Glob SerPl: 0.9 (ref 0.7–1.7)
Alpha 1: 0.2 g/dL (ref 0.0–0.4)
Alpha2 Glob SerPl Elph-Mcnc: 0.7 g/dL (ref 0.4–1.0)
B-Globulin SerPl Elph-Mcnc: 0.8 g/dL (ref 0.7–1.3)
Gamma Glob SerPl Elph-Mcnc: 0.8 g/dL (ref 0.4–1.8)
Globulin, Total: 2.4 g/dL (ref 2.2–3.9)
IgA: 68 mg/dL (ref 61–437)
IgG (Immunoglobin G), Serum: 1027 mg/dL (ref 700–1600)
IgM (Immunoglobulin M), Srm: 26 mg/dL (ref 15–143)
M Protein SerPl Elph-Mcnc: 0.5 g/dL — ABNORMAL HIGH
Total Protein ELP: 4.4 g/dL — ABNORMAL LOW (ref 6.0–8.5)

## 2018-12-09 LAB — BASIC METABOLIC PANEL
Anion gap: 9 (ref 5–15)
BUN: 69 mg/dL — ABNORMAL HIGH (ref 8–23)
CO2: 19 mmol/L — ABNORMAL LOW (ref 22–32)
Calcium: 9.6 mg/dL (ref 8.9–10.3)
Chloride: 106 mmol/L (ref 98–111)
Creatinine, Ser: 2.55 mg/dL — ABNORMAL HIGH (ref 0.61–1.24)
GFR calc Af Amer: 28 mL/min — ABNORMAL LOW (ref >60)
GFR calc non Af Amer: 24 mL/min — ABNORMAL LOW (ref >60)
Glucose, Bld: 72 mg/dL (ref 70–99)
Potassium: 4.5 mmol/L (ref 3.5–5.1)
Sodium: 134 mmol/L — ABNORMAL LOW (ref 135–145)

## 2018-12-09 LAB — GLUCOSE, CAPILLARY
Glucose-Capillary: 63 mg/dL — ABNORMAL LOW (ref 70–99)
Glucose-Capillary: 154 mg/dL — ABNORMAL HIGH (ref 70–99)
Glucose-Capillary: 171 mg/dL — ABNORMAL HIGH (ref 70–99)
Glucose-Capillary: 171 mg/dL — ABNORMAL HIGH (ref 70–99)

## 2018-12-09 LAB — ECHOCARDIOGRAM COMPLETE
Height: 68 in
Weight: 2497.37 oz

## 2018-12-09 MED ORDER — CALCITONIN (SALMON) 200 UNIT/ML IJ SOLN
4.0000 [IU]/kg | Freq: Once | INTRAMUSCULAR | Status: AC
  Administered 2018-12-09: 284 [IU] via SUBCUTANEOUS
  Filled 2018-12-09: qty 1.42

## 2018-12-09 MED ORDER — SODIUM CHLORIDE 0.9 % IV SOLN
INTRAVENOUS | Status: DC
  Administered 2018-12-09 – 2018-12-10 (×3): via INTRAVENOUS

## 2018-12-09 MED ORDER — CALCITONIN (SALMON) 200 UNIT/ACT NA SOLN: 1.0000 | Freq: Every day | NASAL | Status: DC

## 2018-12-09 NOTE — Progress Notes (Signed)
Nathan Davis Progress Note   73 y.o. male CLL DM HTN CKD3 here with anorexia and weakness for a few days. Imbruvica was stopped on 3/10 bec of SE. Cr was 1.28 11/25/2018 and has fluctuated from 1.2-1.5 in 2019.  He has had very poor appetite for solids and fluids with malaise over the past 2-3 weeks even after the Imbruvica was discontinued.  Some NSAID use a week ago.   Assessment/ Plan:   1. Renal failure - pt is positive >1.5L during this hospitalization and renal function is stable but not improved. Certainly from his trends this is AKI with a creatinine of 1.28 on 11/25/2018. Would have thought this is prerenal azotemia vs progression to ATN and he has not improved much with hydration. No e/o obstruction; sometimes specific  tyrosine kinase inhibitors can cause tubular dysfunction. -  CXR PA/lateral suggests bilateral scarring which explains the crackles as there does not appear to be pulmonary edema. - Will check UPC today. -  Foley placed 3/23 -> I  personally reviewed a microscopy 3/24 -> 0-5 WBC/HPF but otherwise bland sediment. - Less likely to be obstruction as no hydronephrosis present.  - WBC incr with leucocyturia; ? UTI. Will send a culture.  - Also will give fluids (just 1L) given he has lower ext edema. Not c/o dyspnea. - Hemodynamically mediated? Doesn't appear to be ATN by microscopy this AM.  2. DM  3. CLL 4. HTN  Subjective:   Has appetite. Denies f/c/n/v/dyspnea.   Objective:   BP 121/66 (BP Location: Right Arm)   Pulse (!) 102   Temp 98.1 F (36.7 C) (Oral)   Resp 19   Ht 5\' 8"  (1.727 m)   Wt 70.9 kg   SpO2 95%   BMI 23.76 kg/m   Intake/Output Summary (Last 24 hours) at 12/09/2018 0716 Last data filed at 12/09/2018 0539 Gross per 24 hour  Intake 1335.74 ml  Output 550 ml  Net 785.74 ml   Weight change: 0.09 kg  Physical Exam: GEN: NAD, A&Ox3, NCAT HEENT: No conjunctival pallor, EOMI NECK: Supple, no thyromegaly LUNGS: CTA B/L  rales CV: RRR, No M/R/G ABD: SNDNT +BS  EXT: 1+ pretibial edema GU: Foley to gravity Imaging: Dg Chest 2 View  Result Date: 12/08/2018 CLINICAL DATA:  Chest and back pain EXAM: CHEST - 2 VIEW COMPARISON:  December 06, 2018 chest radiograph and chest CT April 18, 2018 FINDINGS: There is a shallow degree of inspiration. There is scarring in each lung base, stable. There is no frank edema or consolidation. Heart is upper normal in size with pulmonary vascularity normal. No adenopathy. There is degenerative change in each shoulder. IMPRESSION: Stable bibasilar scarring. No new opacity evident. Stable cardiac silhouette. Electronically Signed   By: Lowella Grip III M.D.   On: 12/08/2018 14:01    Labs: BMET Recent Labs  Lab 12/11/2018 2144 12/07/18 0333 12/08/18 0750 12/09/18 0359  NA 133* 129* 131* 134*  K 5.4* 4.9 4.5 4.5  CL 103 101 104 106  CO2 19* 20* 18* 19*  GLUCOSE 287* 241* 99 72  BUN 64* 63* 66* 69*  CREATININE 2.51* 2.42* 2.51* 2.55*  CALCIUM 10.0 9.4 9.3 9.6   CBC Recent Labs  Lab 12/01/2018 2144 12/07/18 0333 12/08/18 0750 12/09/18 0359  WBC 20.6* 20.4* 23.7* 24.0*  NEUTROABS  --   --  11.1*  --   HGB 9.3* 8.8* 9.0* 9.0*  HCT 26.8* 26.6* 26.0* 26.5*  MCV 64.7* 65.2* 64.7* 63.4*  PLT  393 390 431* 387    Medications:    . calcitonin (salmon)  1 spray Alternating Nares Daily  . feeding supplement (ENSURE ENLIVE)  237 mL Oral BID BM  . heparin  5,000 Units Subcutaneous Q8H  . insulin aspart  0-5 Units Subcutaneous QHS  . insulin aspart  0-9 Units Subcutaneous TID WC  . insulin detemir  15 Units Subcutaneous Daily  . mirtazapine  15-30 mg Oral QHS      Otelia Santee, MD 12/09/2018, 7:16 AM

## 2018-12-09 NOTE — TOC Initial Note (Addendum)
Transition of Care (TOC) - Initial/Assessment Note - 12/09/18  **DISCHARGE PLAN IS HOME WITH HOME HEALTH    Patient Details  Name: Nathan Davis MRN: [id number] Date of Birth: May 23, 1946  Transition of Care [facility]) CM/SW Contact:    Sable Feil, LCSW Phone Number: 12/09/2018, 11:52 AM  Clinical Narrative: CSW talked with patient at the bedside and later with wife by phone. Nathan Davis was sitting up in a chair and was alert and oriented. He kept his eyes closed during most of CSW's visit, and would open them when he responded to CSW's questions. When asked, patient responded that he has never been to a facility for ST rehab. Patient advised of PT/MD's recommendation of ST rehab and the process was explained and Medicare SNF list provided. Nathan Davis did not express disagreement with going to rehab and gave CSW permission to contact his wife.  CSW talked later by phone with Nathan Davis regarding the recommended discharge plan and she declined. Wife reported that she assists patient at home, along with one of their sons who lives with the. Nathan Davis indicated that they have 4 children and adult grandchildren and they have and will help out with patient as needed. When asked, Wife informed CSW that her husband's last hospital stay was in the 1980's for a hernia. Nathan Davis indicated that they have never had home health services, and was informed that this will be discussed with her prior to her husband's discharge..                    Expected Discharge Plan: [facility](Patient's wife does not want SNF) Barriers to Discharge: Continued Medical Work up   Patient Goals and CMS Choice Patient states their goals for this hospitalization and ongoing recovery are:: Per wife, she wants patient to get better and come home. Care will be provided by family and home health CMS Medicare.gov Compare Post Acute Care list provided to:: Patient Choice offered to /  list presented to : NA(Initial assessment - bed search not yet initiated)  Expected Discharge Plan and Services Expected Discharge Plan: [facility](Patient's wife does not want SNF) In-house Referral: Clinical Social Work Discharge Planning Services: CM Consult Post Acute Care Choice: Home Health(Patient agreeable to ST rehab) Living arrangements for the past 2 months: Single Family Home                 DME Arranged: (Appropriate services/equipment will be put in place prior to discharge) DME Agency: Other - Comment(DME choice not offered at this time - will be arranged prior to discharge) [place] Arranged: Refused SNF(Wife declined SNF and is agreeable to [facility] services) [place] Agency: Other - See comment(Choice not offered at this time)  Prior Living Arrangements/Services Living arrangements for the past 2 months: [place] Lives with:: Spouse Patient language and need for interpreter reviewed:: No Do you feel safe going back to the place where you live?: Yes(Wife reported that she, her children and grandchldren will assist in looking after patient)      Need for Family Participation in Patient Care: Yes (Comment) Care giver support system in place?: Yes (comment) Current home services: Other (comment)(No home health services per wife) Criminal Activity/Legal Involvement Pertinent to Current Situation/Hospitalization: No - Comment as needed  Activities of Daily Living Home Assistive Devices/Equipment: Cane (specify quad or straight) ADL Screening (condition at time of admission) Patient's cognitive ability adequate to safely complete daily activities?: Yes Is  the patient deaf or have difficulty hearing?: No Does the patient have difficulty seeing, even when wearing glasses/contacts?: No Does the patient have difficulty concentrating, remembering, or making decisions?: No Patient able to express need for assistance with ADLs?: Yes Does the patient have difficulty  dressing or bathing?: No Independently performs ADLs?: Yes (appropriate for developmental age) Does the patient have difficulty walking or climbing stairs?: Yes Weakness of Legs: Both Weakness of Arms/Hands: None  Permission Sought/Granted Permission sought to share information with : Family Supports Permission granted to share information with : Yes, Verbal Permission Granted  Share Information with NAME: Nathan Davis     Permission granted to share info w Relationship: Wife  Permission granted to share info w Contact Information: [phone]  Emotional Assessment Appearance:: Appears stated age Attitude/Demeanor/Rapport: Other (comment)(Patient did not initiate conversation, however did respond to CSW's questions) Affect (typically observed): Pleasant, Appropriate Orientation: : Oriented to Self, Oriented to Place, Oriented to  Time, Oriented to Situation Alcohol / Substance Use: Tobacco Use, Alcohol Use, Illicit Drugs(Per H&P, patient has never smoked and does not drink or use illict drugs) Psych Involvement: No (comment)  Admission diagnosis:  afib Patient Active Problem List   Diagnosis Date Noted  . AKI (acute kidney injury) ([place]) 12/07/2018  . Acute liver failure 12/07/2018  . Osteoporosis 12/07/2018  . Hypercalcemia 10/03/2018  . CKD stage 3 due to type 2 diabetes mellitus ([place]) 06/26/2018  . BPH with obstruction/lower urinary tract symptoms 06/26/2018  . Left inguinal hernia 01/22/2018  . Lumbar compression fracture ([place]) 12/03/2017  . CLL (chronic lymphocytic leukemia) ([place]) 10/09/2016  . Hyperlipidemia associated with type 2 diabetes mellitus ([place]) 02/23/2016  . PVC (premature ventricular contraction) 02/04/2014  . Essential hypertension 12/09/2013  . Vitamin D deficiency 12/09/2013  . Medication management 12/09/2013  . Type 2 diabetes mellitus with stage 3 chronic kidney disease, with long-term current use of insulin ([place]) 01/04/2010   PCP:  [name], MD Pharmacy:   [facility] [address] [place] - [address] [place] [address] Phone: [phone] Fax: [phone]  Nathan [place], [place] - [place] [address] Phone: [phone] Fax: [phone]  [id number] [place], [place] [address] [place] [address] Phone: [phone] Fax: [phone]  [place], [place] [facility] [address] [place] [address] [place] [address] Phone: [phone] Fax: [phone]     Social Determinants of Health ([place]) Interventions SDOH Intervention services not needed at this time.    Readmission Risk Interventions No flowsheet data found.

## 2018-12-09 NOTE — Progress Notes (Signed)
PROGRESS NOTE    Nathan Davis  AST:[id number] DOB: 05/17/1946 DOA: 11/24/2018 PCP: [name], MD   Brief Narrative: Patient is a 73 year old male with past medical history of CLL diagnosed in 2017, on Imbruvica, iron deficiency anemia, hypertension, hyperlipidemia, diabetes type 2, CKD stage III who follows with oncology  Dr. Morene Rankins who presented to the ED for  evaluation of progressive weakness, fatigue, joint pain and weight loss.  Patient reported progressive worsening of loss of appetite, general weakness.  Patient found to have acute kidney injury on presentation, found to be dehydrated, hyperkalemic, hypercalcemic.  Also found to have abnormal LFTs.  Nephrology consulted for persistent acute kidney injury.  Oncology also following. Assessment & Plan:   Principal Problem:   AKI (acute kidney injury) ([facility]) Active Problems:   Type 2 diabetes mellitus with stage 3 chronic kidney disease, with long-term current use of insulin (HCC)   CLL (chronic lymphocytic leukemia) (HCC)   Lumbar compression fracture (HCC)   Hypercalcemia   Acute liver failure   Acute on chronic CKD stage III: Baseline creatinine around 1.2.  Presented with creatinine of more than 2.5.  Suspected to be from dehydration from poor oral intake and hypercalcemia.  CT abdomen/pelvis did not show any acute intra-abdominal pathology.  No clinical findings of urinary retention.  Nephrology following. Nephrology sending urine albumin/creatinine today.  Bladder scan did not show any significant retention.  Kidney function has not improved. He has been given 1 L of IV fluid today.  He is nonoliguric.  Initially thought to be secondary to Imbruvica but it is less likely.  Dehydration/hyponatremia: Improving with IV fluids.  He will be given 1 L of fluid today.  Hypercalcemia: He was on nasal calcitonin spray till today.  We will stop that and give him a dose of subcutaneous calcitonin.  Corrected calcium level is 11.5.   Will check calcium level tomorrow. PTH related peptide, multiple myeloma panel pending.  Home vitamin D on hold.  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  Abnormal LFTs: Unclear etiology.  Might be treated with CLL or Imbruvica.  Imbruvica discontinued on 3/10.  No GI symptoms were acute hepatitis panel negative.  CLL/leukocytosis: H&H stable.  Follows with oncology Dr. Alen Blew.  Was also on prednisone.  On Imbruvica which is on hold. Nephrology also sent urine culture to rule out urinary tract infection for persistent leukocytosis  Bibasilar crackles:Stable bibasilar scarring as per  chest x-ray.  No clinical evidence of pulmonary edema.  Uncontrolled diabetes type 2 with hyperglycemia: Hemoglobin A1c of 7.6 on 10/02/2018.  On Levemir at home.  Hypoglycemic this morning.  Levemir will be on hold.  Only continue sliding scale insulin for now.  Also on metformin at home.  Hypertension: Currently controlled.  Atenolol on hold  Hyperlipidemia: Statins on hold due to elevated LFTs.  Iron deficiency anemia: Stable  Vertebral compression fractures: CT showed mild compression deformities of all vertebral bodies of lumbar sacral spine, new compression deformity of T10-T12.  No back pain reported.  Continue supportive care  Prostatomegaly: Follow-up with urology as an outpatient.  No urinary retention at present.  Abdominal pelvic ascites: Moderate on CT scan but clinically not severe to be tapped.  Generalized deconditioning/failure to thrive/multiple comorbidities: Physical therapy evaluated the patient recommended skilled nursing facility on discharge.  Social worker consulted.    Nutrition Problem: Moderate Malnutrition Etiology: chronic illness      DVT prophylaxis: Heparin [place] Code Status: Full Family Communication: None present at the  bedside Disposition Plan: Skilled nursing facility after full work-up   Consultants: Oncology, nephrology  Procedures: None   Antimicrobials:  Anti-infectives (From admission, onward)   None      Subjective: Patient seen and examined the bedside this morning.  Hemodynamically stable but appears very weak and debilitated.  Denies any chest pain or shortness of breath.  Objective: Vitals:   12/08/18 0851 12/08/18 1753 12/08/18 2047 12/09/18 0539  BP: 121/60 (!) 103/56 (!) 107/55 121/66  Pulse: 96 96 100 (!) 102  Resp: 18 18 18 19  Temp: 98.4 F (36.9 C) 98 F (36.7 C) 98 F (36.7 C) 98.1 F (36.7 C)  TempSrc: Oral Oral Oral Oral  SpO2: 96% 96% 95% 95%  Weight:   70.9 kg   Height:        Intake/Output Summary (Last 24 hours) at 12/09/2018 0846 Last data filed at 12/09/2018 0700 Gross per 24 hour  Intake 1575.74 ml  Output 550 ml  Net 1025.74 ml   Filed Weights   12/07/18 0417 12/07/18 2018 12/08/18 2047  Weight: 71.8 kg 70.8 kg 70.9 kg    Examination:  General exam: Generalized weakness, chronically ill looking, debilitated elderly male  HEENT:PERRL,Oral mucosa moist, Ear/Nose normal on gross exam Respiratory system: Bilateral basilar crackles Cardiovascular system: S1 & S2 heard, RRR. No JVD, murmurs, rubs, gallops or clicks.  Trace pedal edema. Gastrointestinal system: Abdomen is nondistended, soft and nontender. No organomegaly or masses felt. Normal bowel sounds heard. Central nervous system: Alert and oriented. No focal neurological deficits. Extremities: Trace bilateral lower extremity edema, no clubbing ,no cyanosis, distal peripheral pulses palpable. Skin: No rashes, lesions or ulcers,no icterus ,no pallor GU: Condom catheter   Data Reviewed: I have personally reviewed following labs and imaging studies  CBC: Recent Labs  Lab 11/26/2018 2144 12/07/18 0333 12/08/18 0750 12/09/18 0359  WBC 20.6* 20.4* 23.7* 24.0*  NEUTROABS  --   --  11.1*  --   HGB 9.3* 8.8* 9.0* 9.0*  HCT 26.8* 26.6* 26.0* 26.5*  MCV 64.7* 65.2* 64.7* 63.4*  PLT 393 390 431* 387   Basic Metabolic Panel:  Recent Labs  Lab 11/25/2018 2144 12/07/18 0333 12/08/18 0750 12/09/18 0359  NA 133* 129* 131* 134*  K 5.4* 4.9 4.5 4.5  CL 103 101 104 106  CO2 19* 20* 18* 19*  GLUCOSE 287* 241* 99 72  BUN 64* 63* 66* 69*  CREATININE 2.51* 2.42* 2.51* 2.55*  CALCIUM 10.0 9.4 9.3 9.6   GFR: Estimated Creatinine Clearance: 25 mL/min (A) (by C-G formula based on SCr of 2.55 mg/dL (H)). Liver Function Tests: Recent Labs  Lab 11/21/2018 2144 12/07/18 0333 12/08/18 0750 12/09/18 0359  AST 136* 130* 133* 116*  ALT 82* 81* 88* 80*  ALKPHOS 335* 320* 344* 346*  BILITOT 4.0* 4.2* 4.7* 5.0*  PROT 4.8* 4.6* 4.6* 4.5*  ALBUMIN 1.8* 1.7* 1.7* 1.6*   Recent Labs  Lab 12/05/2018 2144  LIPASE 38   Recent Labs  Lab 12/07/18 0333  AMMONIA 43*   Coagulation Profile: Recent Labs  Lab 12/07/18 0333  INR 1.4*   Cardiac Enzymes: No results for input(s): CKTOTAL, CKMB, CKMBINDEX, TROPONINI in the last 168 hours. BNP (last 3 results) No results for input(s): PROBNP in the last 8760 hours. HbA1C: No results for input(s): HGBA1C in the last 72 hours. CBG: Recent Labs  Lab 12/08/18 0720 12/08/18 1151 12/08/18 1624 12/08/18 2046 12/09/18 0657  GLUCAP 84 78 85 113* 63*   Lipid Profile: Recent Labs      12/07/18 0333  CHOL 146  HDL 10*  LDLCALC 94  TRIG 211*  CHOLHDL 14.6   Thyroid Function Tests: No results for input(s): TSH, T4TOTAL, FREET4, T3FREE, THYROIDAB in the last 72 hours. Anemia Panel: No results for input(s): VITAMINB12, FOLATE, FERRITIN, TIBC, IRON, RETICCTPCT in the last 72 hours. Sepsis Labs: No results for input(s): PROCALCITON, LATICACIDVEN in the last 168 hours.  No results found for this or any previous visit (from the past 240 hour(s)).       Radiology Studies: Dg Chest 2 View  Result Date: 12/08/2018 CLINICAL DATA:  Chest and back pain EXAM: CHEST - 2 VIEW COMPARISON:  December 06, 2018 chest radiograph and chest CT April 18, 2018 FINDINGS: There is a shallow  degree of inspiration. There is scarring in each lung base, stable. There is no frank edema or consolidation. Heart is upper normal in size with pulmonary vascularity normal. No adenopathy. There is degenerative change in each shoulder. IMPRESSION: Stable bibasilar scarring. No new opacity evident. Stable cardiac silhouette. Electronically Signed   By: William  Woodruff III M.D.   On: 12/08/2018 14:01        Scheduled Meds: . calcitonin  4 Units/kg Subcutaneous Once  . feeding supplement (ENSURE ENLIVE)  237 mL Oral BID BM  . heparin  5,000 Units Subcutaneous Q8H  . insulin aspart  0-5 Units Subcutaneous QHS  . insulin aspart  0-9 Units Subcutaneous TID WC  . mirtazapine  15-30 mg Oral QHS   Continuous Infusions: . sodium chloride 10 mL/hr at 12/08/18 1324  . sodium chloride       LOS: 2 days    Time spent: 35 mins.More than 50% of that time was spent in counseling and/or coordination of care.      Amrit Adhikari, MD [facility] Pager [phone]  If 7PM-7AM, please contact night-coverage [email or web address] Password [email or web address] 12/09/2018, 8:46 AM  

## 2018-12-09 NOTE — Progress Notes (Signed)
Inpatient Diabetes Program Recommendations  AACE/ADA: New Consensus Statement on Inpatient Glycemic Control (2015)  Target Ranges:  Prepandial:   less than 140 mg/dL      Peak postprandial:   less than 180 mg/dL (1-2 hours)      Critically ill patients:  140 - 180 mg/dL   Lab Results  Component Value Date   GLUCAP 154 (H) 12/09/2018   HGBA1C 7.6 (H) 10/02/2018    Review of Glycemic Control Results for Nathan Davis, COIA (MRN [id number]) as of 12/09/2018 15:20  Ref. Range 12/08/2018 20:46 12/09/2018 06:57 12/09/2018 11:12  Glucose-Capillary Latest Ref Range: 70 - 99 mg/dL 113 (H) 63 (L) 154 (H)   Diabetes history: Type 2 DM Outpatient Diabetes medications: Levemir 30 units daily; Metformin 1000 mg BID Current orders for Inpatient glycemic control: Novolog Sensitive Correction Scale/SSI (0-9 units)  Inpatient Diabetes Program Recommendations:     Noted hypoglycemic event this AM and subsequent discontinuation of Levemir. CBGs now within goal. Will continue to follow.  Also noted GFR < 45 and AKI on admission, therefore would not recommend metformin on discharge.  Thanks, Bronson Curb, MSN, RNC-OB Diabetes Coordinator [phone] (8a-5p)

## 2018-12-09 NOTE — Progress Notes (Signed)
Physical Therapy Treatment Patient Details Name: Nathan Davis MRN: [id number] DOB: [birth year] Today's Date: 12/09/2018    History of Present Illness Pt is a 73 y/o male with a PMH significant for HTN, anemia, history of hernia x2 s/p repair x2, DM II, CLL. Pt presents with progressive weakness over ~4 weeks and reportedly was independent PTA. Imaging revealed mild compression in virtually all vertebral bodies, with new compression deformity in T10-T12 (osteopenia vs metastatic).      PT Comments    Pt appears slightly more lethargic today. Pt was able to tolerate ambulating around the bed to the chair. Once sitting, pt SOB but at 91% on RA. Continue to feel SNF is the safest d/c disposition at this time. Will continue to follow.     Follow Up Recommendations  SNF;Supervision/Assistance - 24 hour     Equipment Recommendations  Rolling walker with 5" wheels;3in1 (PT)    Recommendations for Other Services       Precautions / Restrictions Precautions Precautions: Fall Precaution Comments: Pt reports a fall off of his porch an unspecified time ago (sounded recent?) Restrictions Weight Bearing Restrictions: No    Mobility  Bed Mobility Overal bed mobility: Needs Assistance Bed Mobility: Supine to Sit     Supine to sit: Min guard     General bed mobility comments: Increased time to transition to EOB. Once sitting, pt required continued cues to scoot out fully to EOB.  Transfers Overall transfer level: Needs assistance Equipment used: Rolling walker (2 wheeled) Transfers: Sit to/from Stand Sit to Stand: Mod assist         General transfer comment: Heavy mod assist to power-up to full standing position. VC's for hand placement on seated surface for safety.   Ambulation/Gait Ambulation/Gait assistance: Min assist Gait Distance (Feet): 12 Feet Assistive device: Rolling walker (2 wheeled) Gait Pattern/deviations: Decreased stride length;Shuffle;Trunk flexed;Narrow  base of support Gait velocity: Decreased Gait velocity interpretation: <1.31 ft/sec, indicative of household ambulator General Gait Details: VC's for improved posture and forward gaze. Pt taking very small, shuffling steps and fatiguing quickly. Consistent min assist provided for balance support as well as walker management.    Stairs             Wheelchair Mobility    Modified Rankin (Stroke Patients Only)       Balance Overall balance assessment: Needs assistance Sitting-balance support: Feet supported;No upper extremity supported Sitting balance-Leahy Scale: Fair     Standing balance support: No upper extremity supported Standing balance-Leahy Scale: Poor Standing balance comment: Reliant on UE support throughout gait training.                            Cognition Arousal/Alertness: Awake/alert Behavior During Therapy: WFL for tasks assessed/performed Overall Cognitive Status: Impaired/Different from baseline Area of Impairment: Orientation;Problem solving                 Orientation Level: Disoriented to;Time           Problem Solving: Slow processing        Exercises      General Comments        Pertinent Vitals/Pain Pain Assessment: No/denies pain    Home Living                      Prior Function            PT Goals (current goals can now be found  in the care plan section) Acute Rehab PT Goals Patient Stated Goal: Return home PT Goal Formulation: With patient Time For Goal Achievement: 12/22/18 Potential to Achieve Goals: Good Progress towards PT goals: Progressing toward goals    Frequency    Min 2X/week      PT Plan Current plan remains appropriate    Co-evaluation              AM-PAC PT "6 Clicks" Mobility   Outcome Measure  Help needed turning from your back to your side while in a flat bed without using bedrails?: None Help needed moving from lying on your back to sitting on the side  of a flat bed without using bedrails?: A Little Help needed moving to and from a bed to a chair (including a wheelchair)?: A Lot Help needed standing up from a chair using your arms (e.g., wheelchair or bedside chair)?: A Lot Help needed to walk in hospital room?: A Little Help needed climbing 3-5 steps with a railing? : Total 6 Click Score: 15    End of Session Equipment Utilized During Treatment: Gait belt Activity Tolerance: Patient limited by fatigue Patient left: in bed;with call bell/phone within reach;with bed alarm set Nurse Communication: Mobility status PT Visit Diagnosis: Unsteadiness on feet (R26.81);Muscle weakness (generalized) (M62.81);Difficulty in walking, not elsewhere classified (R26.2)     Time: 6378-5885 PT Time Calculation (min) (ACUTE ONLY): 25 min  Charges:  $Gait Training: 23-37 mins                     [name], PT, DPT Acute Rehabilitation Services Pager: [phone] Office: [phone]    [name] 12/09/2018, 10:38 AM

## 2018-12-10 LAB — CBC WITH DIFFERENTIAL/PLATELET
Abs Immature Granulocytes: 0 10*3/uL (ref 0.00–0.07)
Basophils Absolute: 0 10*3/uL (ref 0.0–0.1)
Basophils Relative: 0 %
Eosinophils Absolute: 0 10*3/uL (ref 0.0–0.5)
Eosinophils Relative: 0 %
HCT: 27 % — ABNORMAL LOW (ref 39.0–52.0)
Hemoglobin: 9.4 g/dL — ABNORMAL LOW (ref 13.0–17.0)
Lymphocytes Relative: 18 %
Lymphs Abs: 4.7 10*3/uL — ABNORMAL HIGH (ref 0.7–4.0)
MCH: 22.1 pg — ABNORMAL LOW (ref 26.0–34.0)
MCHC: 34.8 g/dL (ref 30.0–36.0)
MCV: 63.5 fL — ABNORMAL LOW (ref 80.0–100.0)
Monocytes Absolute: 1.3 10*3/uL — ABNORMAL HIGH (ref 0.1–1.0)
Monocytes Relative: 5 %
Neutro Abs: 20.3 10*3/uL — ABNORMAL HIGH (ref 1.7–7.7)
Neutrophils Relative %: 77 %
Platelets: 405 10*3/uL — ABNORMAL HIGH (ref 150–400)
RBC: 4.25 MIL/uL (ref 4.22–5.81)
RDW: 22.9 % — ABNORMAL HIGH (ref 11.5–15.5)
WBC: 26.3 10*3/uL — ABNORMAL HIGH (ref 4.0–10.5)
nRBC: 0 % (ref 0.0–0.2)

## 2018-12-10 LAB — BASIC METABOLIC PANEL
Anion gap: 8 (ref 5–15)
BUN: 86 mg/dL — ABNORMAL HIGH (ref 8–23)
CO2: 20 mmol/L — ABNORMAL LOW (ref 22–32)
Calcium: 9 mg/dL (ref 8.9–10.3)
Chloride: 105 mmol/L (ref 98–111)
Creatinine, Ser: 2.96 mg/dL — ABNORMAL HIGH (ref 0.61–1.24)
GFR calc Af Amer: 23 mL/min — ABNORMAL LOW (ref >60)
GFR calc non Af Amer: 20 mL/min — ABNORMAL LOW (ref >60)
Glucose, Bld: 199 mg/dL — ABNORMAL HIGH (ref 70–99)
Potassium: 5.4 mmol/L — ABNORMAL HIGH (ref 3.5–5.1)
Sodium: 133 mmol/L — ABNORMAL LOW (ref 135–145)

## 2018-12-10 LAB — GLUCOSE, CAPILLARY
Glucose-Capillary: 213 mg/dL — ABNORMAL HIGH (ref 70–99)
Glucose-Capillary: 245 mg/dL — ABNORMAL HIGH (ref 70–99)
Glucose-Capillary: 255 mg/dL — ABNORMAL HIGH (ref 70–99)

## 2018-12-10 MED ORDER — INSULIN DETEMIR 100 UNIT/ML ~~LOC~~ SOLN
5.0000 [IU] | Freq: Every day | SUBCUTANEOUS | Status: DC
  Administered 2018-12-10: 5 [IU] via SUBCUTANEOUS
  Filled 2018-12-10 (×3): qty 0.05

## 2018-12-10 MED ORDER — HEPARIN SODIUM (PORCINE) 5000 UNIT/ML IJ SOLN
5000.0000 [IU] | Freq: Three times a day (TID) | INTRAMUSCULAR | Status: AC
  Administered 2018-12-10 (×2): 5000 [IU] via SUBCUTANEOUS
  Filled 2018-12-10 (×2): qty 1

## 2018-12-10 MED ORDER — HEPARIN SODIUM (PORCINE) 5000 UNIT/ML IJ SOLN: 5000.0000 [IU] | Freq: Three times a day (TID) | INTRAMUSCULAR | Status: DC

## 2018-12-10 NOTE — Progress Notes (Signed)
PT Cancellation Note  Patient Details Name: Nathan Davis MRN: [id number] DOB: Jun 23, 1946   Cancelled Treatment:    Reason Eval/Treat Not Completed: Medical issues which prohibited therapy. Discussed pt case with RN who states pt hypotensive this morning, and asks that PT hold until after kidney biopsy scheduled for this afternoon. Will continue to follow and attempt again at the next available date.    [name] 12/10/2018, 10:30 AM   [name], PT, DPT Acute Rehabilitation Services Pager: [phone] Office: [phone]

## 2018-12-10 NOTE — Progress Notes (Signed)
Events noted in the last few days.  Laboratory data were also reviewed.  His CBC did not show any dramatic changes with white cell count remains under reasonable control.  No rapid elevation in his lymphocytosis.  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  His renal failure work-up remains ongoing.  Kidney biopsy is planned in the near future.  CLL involvement in the kidney is certainly possible and if this is confirmed, treatment for his CLL will commence using systemic chemotherapy as an outpatient.  We will continue to follow his progress during his hospitalization.

## 2018-12-10 NOTE — Progress Notes (Signed)
[facility] Progress Note    Assessment/ Plan:    73 y.o.maleCLL DM HTN CKD3 here with anorexia and weakness for a few days. Imbruvica was stopped on 3/10 bec of SE. Cr was 1.28 11/25/2018 and has fluctuated from 1.2-1.5 in 2019.He has had very poor appetite for solids and fluids with malaise over the past 2-3 weeks even after the Imbruvica was discontinued.  Some NSAID use a week ago.   Assessment/ Plan:   1. Renal failure - pt is positive >1.5L during this hospitalization and renal function is stable but not improved. Certainly from his trends this is AKI with a creatinine of 1.28 on 11/25/2018. Would have thought this is prerenal azotemia vs progression to ATN and he has not improved much with hydration. No e/o obstruction; sometimes specific tyrosine kinase inhibitors can cause tubular dysfunction. -  CXR PA/lateral suggests bilateral scarring which explains the crackles as there does not appear to be pulmonary edema. -  UPC 11.9 likely from today; SPEP monoclonal IgG kappa. -  I  personally reviewed a microscopy 3/24 -> 0-5 WBC/HPF but otherwise bland sediment. Currently has a condom cath. - Less likely to be obstruction as no hydronephrosis present but foley to gravity to be placed today.  - WBC incr with leucocyturia; ? UTI. Will send a culture.  - Has not responded to fluids and has lower ext edema. Not c/o dyspnea and will continue fluids for now given insensible losses as well.  - Hemodynamically mediated? Doesn't appear to be ATN by microscopy (3/24).  - Will check urine alb/Cr to confirm this is mainly non albumin based proteinuria. - Plan for renal biopsy tomorrow; may be monoclonal based tubular damage vs ATN vs MCD vs membranous. CLL can sometimes cause a MPGN picture but this does not appear to be the case.  2. DM  3. CLL 4. HTN   Subjective:   Has appetite. Denies f/c/n/v/dyspnea.   Objective:   BP (!) 113/56 (BP Location: Left Arm)   Pulse  (!) 110   Temp 98.1 F (36.7 C) (Oral)   Resp 16   Ht 5\' 8"  (1.727 m)   Wt 71 kg   SpO2 96%   BMI 23.80 kg/m   Intake/Output Summary (Last 24 hours) at 12/10/2018 0719 Last data filed at 12/10/2018 0454 Gross per 24 hour  Intake 680 ml  Output 1125 ml  Net -445 ml   Weight change: 0.11 kg  Physical Exam: GEN: NAD, A&Ox3, NCAT HEENT: No conjunctival pallor, EOMI NECK: Supple, no thyromegaly LUNGS: CTA B/L rales CV: RRR, No M/R/G ABD: SNDNT +BS  EXT: 1+ pretibial edema GU: Condom Foley   Imaging: Dg Chest 2 View  Result Date: 12/08/2018 CLINICAL DATA:  Chest and back pain EXAM: CHEST - 2 VIEW COMPARISON:  December 06, 2018 chest radiograph and chest CT April 18, 2018 FINDINGS: There is a shallow degree of inspiration. There is scarring in each lung base, stable. There is no frank edema or consolidation. Heart is upper normal in size with pulmonary vascularity normal. No adenopathy. There is degenerative change in each shoulder. IMPRESSION: Stable bibasilar scarring. No new opacity evident. Stable cardiac silhouette. Electronically Signed   By: Lowella Grip III M.D.   On: 12/08/2018 14:01    Labs: BMET Recent Labs  Lab 12/14/2018 2144 12/07/18 0333 12/08/18 0750 12/09/18 0359 12/10/18 0510  NA 133* 129* 131* 134* 133*  K 5.4* 4.9 4.5 4.5 5.4*  CL 103 101 104 106 105  CO2 19* 20* 18* 19* 20*  GLUCOSE 287* 241* 99 72 199*  BUN 64* 63* 66* 69* 86*  CREATININE 2.51* 2.42* 2.51* 2.55* 2.96*  CALCIUM 10.0 9.4 9.3 9.6 9.0   CBC Recent Labs  Lab 12/07/18 0333 12/08/18 0750 12/09/18 0359 12/10/18 0510  WBC 20.4* 23.7* 24.0* 26.3*  NEUTROABS  --  11.1*  --  20.3*  HGB 8.8* 9.0* 9.0* 9.4*  HCT 26.6* 26.0* 26.5* 27.0*  MCV 65.2* 64.7* 63.4* 63.5*  PLT 390 431* 387 405*    Medications:    . feeding supplement (ENSURE ENLIVE)  237 mL Oral BID BM  . heparin  5,000 Units Subcutaneous Q8H  . insulin aspart  0-5 Units Subcutaneous QHS  . insulin aspart  0-9 Units  Subcutaneous TID WC  . mirtazapine  15-30 mg Oral QHS      Otelia Santee, MD 12/10/2018, 7:19 AM

## 2018-12-10 NOTE — Progress Notes (Addendum)
PROGRESS NOTE    Nathan Davis  WUJ:[id number] DOB: Apr 10, 1946 DOA: 12/08/2018 PCP: [name], MD   Brief Narrative: Patient is a 73 year old male with past medical history of CLL diagnosed in 2017, on Imbruvica, iron deficiency anemia, hypertension, hyperlipidemia, diabetes type 2, CKD stage III who follows with oncology  Dr. Morene Rankins who presented to the ED for  evaluation of progressive weakness, fatigue, joint pain and weight loss.  Patient reported progressive worsening of loss of appetite, general weakness.  Patient found to have acute kidney injury on presentation, found to be dehydrated, hyperkalemic, hypercalcemic.  Also found to have abnormal LFTs.  Nephrology consulted for persistent acute kidney injury.  Oncology also following.Planning for kidney biopsy by nephrology.Foley inserted today.  Assessment & Plan:   Principal Problem:   AKI (acute kidney injury) ([place]) Active Problems:   Type 2 diabetes mellitus with stage 3 chronic kidney disease, with long-term current use of insulin (HCC)   CLL (chronic lymphocytic leukemia) (HCC)   Lumbar compression fracture (HCC)   Hypercalcemia   Acute liver failure   Acute on chronic CKD stage III: Baseline creatinine around 1.2.  Presented with creatinine of more than 2.5.  Suspected to be from dehydration from poor oral intake and hypercalcemia.  CT abdomen/pelvis did not show any acute intra-abdominal pathology.  No clinical findings of urinary retention.  Nephrology following. Bladder scan did not show any significant retention.  Kidney function has not improved. He has been started on gentle IV fluids.  He is nonoliguric.  Initially thought to be secondary to Imbruvica but it is less likely. Myeloma panel sent.  Elevated M protein. Nephrology planning for Kidney biopsy.  Dehydration/hyponatremia: Improved with fluids.  Hypercalcemia: Improving. He was given few doses of calcitonin.  Corrected calcium level was 11.5.   PTH  related peptide pending.  Home vitamin D on hold.  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  Abnormal LFTs: Unclear etiology.  Might be treated with CLL or Imbruvica.  Imbruvica discontinued on 3/10.  No GI symptoms were acute hepatitis panel negative.  CLL/leukocytosis: H&H stable.  Follows with oncology Dr. Alen Blew.  Was also on prednisone.  On Imbruvica which is on hold. Nephrology also sent urine culture to rule out urinary tract infection for persistent leukocytosis  Bibasilar crackles:Stable bibasilar scarring as per  chest x-ray.  No clinical evidence of pulmonary edema.  Uncontrolled diabetes type 2 with hyperglycemia: Hemoglobin A1c of 7.6 on 10/02/2018.  On Levemir at home.    Only continue sliding scale insulin for now.  Also on metformin at home.  Hypertension: Currently controlled. Hypotensive earlier. Atenolol on hold  Hyperlipidemia: Statins on hold due to elevated LFTs.  Iron deficiency anemia: Stable  Vertebral compression fractures: CT showed mild compression deformities of all vertebral bodies of lumbar sacral spine, new compression deformity of T10-T12.  No back pain reported.  Continue supportive care  Prostatomegaly: Follow-up with urology as an outpatient.  No urinary retention at present.Foley inserted.  Abdominal pelvic ascites: Moderate on CT scan but clinically not severe to be tapped.  Generalized deconditioning/failure to thrive/multiple comorbidities: Physical therapy evaluated the patient recommended skilled nursing facility on discharge.  Social worker consulted.    Nutrition Problem: Moderate Malnutrition Etiology: chronic illness      DVT prophylaxis: Heparin [place] Code Status: Full Family Communication: Discussed with wife on phone Disposition Plan: Skilled nursing facility after full work-up   Consultants: Oncology, nephrology  Procedures: None  Antimicrobials:  Anti-infectives (From admission, onward)  None      Subjective:  Patient seen and examined the bedside this morning.  Hemodynamically stable now.  Mildly hypotensive earlier. Feels better.  Denies any complaints.  Having urine output.  Objective: Vitals:   12/09/18 2113 12/10/18 0454 12/10/18 0925 12/10/18 0943  BP: (!) 110/56 (!) 113/56 (!) 69/47 100/66  Pulse: (!) 102 (!) 110 (!) 114   Resp: 15 16 18    Temp: 98.4 F (36.9 C) 98.1 F (36.7 C) (!) 97.4 F (36.3 C)   TempSrc:  Oral Oral   SpO2: 96% 96% 96%   Weight: 71 kg     Height:        Intake/Output Summary (Last 24 hours) at 12/10/2018 1015 Last data filed at 12/10/2018 0935 Gross per 24 hour  Intake 580 ml  Output 1175 ml  Net -595 ml   Filed Weights   12/07/18 2018 12/08/18 2047 12/09/18 2113  Weight: 70.8 kg 70.9 kg 71 kg    Examination:  General exam: Appears calm and comfortable , chronically ill looking, generalized weakness, debilitated male  HEENT:PERRL,Oral mucosa moist, Ear/Nose normal on gross exam Respiratory system: Bilateral basilar crackles  Cardiovascular system: S1 & S2 heard, RRR. No JVD, murmurs, rubs, gallops or clicks. Gastrointestinal system: Abdomen is nondistended, soft and nontender. No organomegaly or masses felt. Normal bowel sounds heard. Central nervous system: Alert and oriented. No focal neurological deficits. Extremities:Trace lower extremity edema, no clubbing ,no cyanosis, distal peripheral pulses palpable. Skin: No rashes, lesions or ulcers,no icterus ,no pallor  Data Reviewed: I have personally reviewed following labs and imaging studies  CBC: Recent Labs  Lab 11/27/2018 2144 12/07/18 0333 12/08/18 0750 12/09/18 0359 12/10/18 0510  WBC 20.6* 20.4* 23.7* 24.0* 26.3*  NEUTROABS  --   --  11.1*  --  20.3*  HGB 9.3* 8.8* 9.0* 9.0* 9.4*  HCT 26.8* 26.6* 26.0* 26.5* 27.0*  MCV 64.7* 65.2* 64.7* 63.4* 63.5*  PLT 393 390 431* 387 106*   Basic Metabolic Panel: Recent Labs  Lab 12/13/2018 2144 12/07/18 0333 12/08/18 0750 12/09/18 0359  12/10/18 0510  NA 133* 129* 131* 134* 133*  K 5.4* 4.9 4.5 4.5 5.4*  CL 103 101 104 106 105  CO2 19* 20* 18* 19* 20*  GLUCOSE 287* 241* 99 72 199*  BUN 64* 63* 66* 69* 86*  CREATININE 2.51* 2.42* 2.51* 2.55* 2.96*  CALCIUM 10.0 9.4 9.3 9.6 9.0   GFR: Estimated Creatinine Clearance: 21.5 mL/min (A) (by C-G formula based on SCr of 2.96 mg/dL (H)). Liver Function Tests: Recent Labs  Lab 11/26/2018 2144 12/07/18 0333 12/08/18 0750 12/09/18 0359  AST 136* 130* 133* 116*  ALT 82* 81* 88* 80*  ALKPHOS 335* 320* 344* 346*  BILITOT 4.0* 4.2* 4.7* 5.0*  PROT 4.8* 4.6* 4.6* 4.5*  ALBUMIN 1.8* 1.7* 1.7* 1.6*   Recent Labs  Lab 11/22/2018 2144  LIPASE 38   Recent Labs  Lab 12/07/18 0333  AMMONIA 43*   Coagulation Profile: Recent Labs  Lab 12/07/18 0333  INR 1.4*   Cardiac Enzymes: No results for input(s): CKTOTAL, CKMB, CKMBINDEX, TROPONINI in the last 168 hours. BNP (last 3 results) No results for input(s): PROBNP in the last 8760 hours. HbA1C: No results for input(s): HGBA1C in the last 72 hours. CBG: Recent Labs  Lab 12/09/18 0657 12/09/18 1112 12/09/18 1636 12/09/18 2113 12/10/18 0653  GLUCAP 63* 154* 171* 171* 213*   Lipid Profile: No results for input(s): CHOL, HDL, LDLCALC, TRIG, CHOLHDL, LDLDIRECT in the last 72  hours. Thyroid Function Tests: No results for input(s): TSH, T4TOTAL, FREET4, T3FREE, THYROIDAB in the last 72 hours. Anemia Panel: No results for input(s): VITAMINB12, FOLATE, FERRITIN, TIBC, IRON, RETICCTPCT in the last 72 hours. Sepsis Labs: No results for input(s): PROCALCITON, LATICACIDVEN in the last 168 hours.  No results found for this or any previous visit (from the past 240 hour(s)).       Radiology Studies: Dg Chest 2 View  Result Date: 12/08/2018 CLINICAL DATA:  Chest and back pain EXAM: CHEST - 2 VIEW COMPARISON:  December 06, 2018 chest radiograph and chest CT April 18, 2018 FINDINGS: There is a shallow degree of inspiration.  There is scarring in each lung base, stable. There is no frank edema or consolidation. Heart is upper normal in size with pulmonary vascularity normal. No adenopathy. There is degenerative change in each shoulder. IMPRESSION: Stable bibasilar scarring. No new opacity evident. Stable cardiac silhouette. Electronically Signed   By: Lowella Grip III M.D.   On: 12/08/2018 14:01        Scheduled Meds: . feeding supplement (ENSURE ENLIVE)  237 mL Oral BID BM  . [START ON 12/11/2018] heparin  5,000 Units Subcutaneous Q8H  . insulin aspart  0-5 Units Subcutaneous QHS  . insulin aspart  0-9 Units Subcutaneous TID WC  . insulin detemir  5 Units Subcutaneous Daily  . mirtazapine  15-30 mg Oral QHS   Continuous Infusions: . sodium chloride 75 mL/hr at 12/09/18 2158     LOS: 3 days    Time spent: 25 mins.More than 50% of that time was spent in counseling and/or coordination of care.      Shelly Coss, MD [facility] Pager [phone]  If 7PM-7AM, please contact night-coverage [email or web address] Password [facility] 12/10/2018, 10:15 AM

## 2018-12-10 NOTE — Progress Notes (Signed)
OT Cancellation Note  Patient Details Name: Nathan Davis MRN: [id number] DOB: Apr 09, 1946   Cancelled Treatment:    Reason Eval/Treat Not Completed: Medical issues which prohibited therapy.  Pt is hypotensive this am and pending a kidney biopsy this pm. Will check back tomorrow.  [name] 12/10/2018, 10:38 AM  Lesle Chris, OTR/L Acute Rehabilitation Services [phone] [facility] pager [phone] office 12/10/2018

## 2018-12-11 ENCOUNTER — Inpatient Hospital Stay (HOSPITAL_COMMUNITY): Payer: Medicare Other

## 2018-12-11 LAB — MICROALBUMIN / CREATININE URINE RATIO
Creatinine, Urine: 141.9 mg/dL
Microalb Creat Ratio: 5401 mg/g creat — ABNORMAL HIGH (ref 0–29)
Microalb, Ur: 7664 ug/mL — ABNORMAL HIGH

## 2018-12-11 LAB — COMPREHENSIVE METABOLIC PANEL
ALT: 61 U/L — ABNORMAL HIGH (ref 0–44)
AST: 76 U/L — ABNORMAL HIGH (ref 15–41)
Albumin: 1.4 g/dL — ABNORMAL LOW (ref 3.5–5.0)
Alkaline Phosphatase: 360 U/L — ABNORMAL HIGH (ref 38–126)
Anion gap: 9 (ref 5–15)
BUN: 104 mg/dL — ABNORMAL HIGH (ref 8–23)
CO2: 18 mmol/L — ABNORMAL LOW (ref 22–32)
Calcium: 8.7 mg/dL — ABNORMAL LOW (ref 8.9–10.3)
Chloride: 106 mmol/L (ref 98–111)
Creatinine, Ser: 3.94 mg/dL — ABNORMAL HIGH (ref 0.61–1.24)
GFR calc Af Amer: 16 mL/min — ABNORMAL LOW (ref >60)
GFR calc non Af Amer: 14 mL/min — ABNORMAL LOW (ref >60)
Glucose, Bld: 212 mg/dL — ABNORMAL HIGH (ref 70–99)
Potassium: 5.4 mmol/L — ABNORMAL HIGH (ref 3.5–5.1)
Sodium: 133 mmol/L — ABNORMAL LOW (ref 135–145)
Total Bilirubin: 5.8 mg/dL — ABNORMAL HIGH (ref 0.3–1.2)
Total Protein: 4.3 g/dL — ABNORMAL LOW (ref 6.5–8.1)

## 2018-12-11 LAB — CBC WITH DIFFERENTIAL/PLATELET
Abs Immature Granulocytes: 0 10*3/uL (ref 0.00–0.07)
Basophils Absolute: 0 10*3/uL (ref 0.0–0.1)
Basophils Relative: 0 %
Eosinophils Absolute: 0 10*3/uL (ref 0.0–0.5)
Eosinophils Relative: 0 %
HCT: 26.4 % — ABNORMAL LOW (ref 39.0–52.0)
Hemoglobin: 9.1 g/dL — ABNORMAL LOW (ref 13.0–17.0)
Lymphocytes Relative: 9 %
Lymphs Abs: 2.1 10*3/uL (ref 0.7–4.0)
MCH: 21.7 pg — ABNORMAL LOW (ref 26.0–34.0)
MCHC: 34.5 g/dL (ref 30.0–36.0)
MCV: 63 fL — ABNORMAL LOW (ref 80.0–100.0)
Monocytes Absolute: 0.9 10*3/uL (ref 0.1–1.0)
Monocytes Relative: 4 %
Neutro Abs: 20.5 10*3/uL — ABNORMAL HIGH (ref 1.7–7.7)
Neutrophils Relative %: 87 %
Platelets: 370 10*3/uL (ref 150–400)
RBC: 4.19 MIL/uL — ABNORMAL LOW (ref 4.22–5.81)
RDW: 22.8 % — ABNORMAL HIGH (ref 11.5–15.5)
WBC: 23.6 10*3/uL — ABNORMAL HIGH (ref 4.0–10.5)
nRBC: 0.1 % (ref 0.0–0.2)
nRBC: 1 /100 WBC — ABNORMAL HIGH

## 2018-12-11 LAB — GLUCOSE, CAPILLARY
Glucose-Capillary: 189 mg/dL — ABNORMAL HIGH (ref 70–99)
Glucose-Capillary: 198 mg/dL — ABNORMAL HIGH (ref 70–99)
Glucose-Capillary: 186 mg/dL — ABNORMAL HIGH (ref 70–99)
Glucose-Capillary: 200 mg/dL — ABNORMAL HIGH (ref 70–99)

## 2018-12-11 LAB — URINE CULTURE: Culture: NO GROWTH

## 2018-12-11 LAB — PTH-RELATED PEPTIDE: PTH-related peptide: <2 pmol/L

## 2018-12-11 MED ORDER — MIDAZOLAM HCL 2 MG/2ML IJ SOLN
INTRAMUSCULAR | Status: AC
  Filled 2018-12-11: qty 2

## 2018-12-11 MED ORDER — FENTANYL CITRATE (PF) 100 MCG/2ML IJ SOLN
INTRAMUSCULAR | Status: AC
  Filled 2018-12-11: qty 2

## 2018-12-11 MED ORDER — SODIUM CHLORIDE 0.9 % IV BOLUS
500.0000 mL | Freq: Once | INTRAVENOUS | Status: AC
  Administered 2018-12-11: 500 mL via INTRAVENOUS

## 2018-12-11 MED ORDER — HEPARIN SODIUM (PORCINE) 5000 UNIT/ML IJ SOLN: 5000.0000 [IU] | Freq: Three times a day (TID) | INTRAMUSCULAR | Status: DC

## 2018-12-11 MED ORDER — FENTANYL CITRATE (PF) 100 MCG/2ML IJ SOLN
INTRAMUSCULAR | Status: AC | PRN
  Administered 2018-12-11: 25 ug via INTRAVENOUS

## 2018-12-11 MED ORDER — MIDAZOLAM HCL 2 MG/2ML IJ SOLN
INTRAMUSCULAR | Status: AC | PRN
  Administered 2018-12-11: 0.5 mg via INTRAVENOUS

## 2018-12-11 MED ORDER — LIDOCAINE-EPINEPHRINE 1 %-1:100000 IJ SOLN
INTRAMUSCULAR | Status: AC
  Filled 2018-12-11: qty 1

## 2018-12-11 MED ORDER — ONDANSETRON HCL 4 MG/2ML IJ SOLN
INTRAMUSCULAR | Status: AC
  Filled 2018-12-11: qty 2

## 2018-12-11 MED ORDER — SODIUM ZIRCONIUM CYCLOSILICATE 10 G PO PACK
10.0000 g | PACK | Freq: Every day | ORAL | Status: DC
  Administered 2018-12-11: 10 g via ORAL
  Filled 2018-12-11 (×2): qty 1

## 2018-12-11 NOTE — Progress Notes (Signed)
Pharmacy consulted to restart prophylactic heparin after IR procedure (renal biopsy). IR consult suggests restart of prophylactic heparin at least 48 hours after procedure. D/W primary MD, and will restart heparin 5000 units sq q8h starting 12/13/18 at 2200.   Nickalas Mccarrick A. Levada Dy, PharmD, [place] Please utilize Amion for appropriate phone number to reach the unit pharmacist ([place])

## 2018-12-11 NOTE — Progress Notes (Signed)
Spoke with Secretary who was close by the [employer]. Asked if patient still needed a PIV due to patient being off the unit when IV team came to assess the patient and the RN stated no.

## 2018-12-11 NOTE — Progress Notes (Signed)
Inpatient Diabetes Program Recommendations  AACE/ADA: New Consensus Statement on Inpatient Glycemic Control (2015)  Target Ranges:  Prepandial:   less than 140 mg/dL      Peak postprandial:   less than 180 mg/dL (1-2 hours)      Critically ill patients:  140 - 180 mg/dL   Lab Results  Component Value Date   GLUCAP 189 (H) 12/11/2018   HGBA1C 7.6 (H) 10/02/2018    Review of Glycemic Control Results for JAYTHEN, HAMME (MRN [id number]) as of 12/11/2018 09:19  Ref. Range 12/10/2018 11:25 12/10/2018 22:12 12/11/2018 07:00  Glucose-Capillary Latest Ref Range: 70 - 99 mg/dL 245 (H) 255 (H) 189 (H)   Diabetes history:Type 2 DM Outpatient Diabetes medications:Levemir 30 units daily; Metformin 1000 mg BID Current orders for Inpatient glycemic control:Novolog Sensitive Correction Scale/SSI (0-9 units), Levemir 5 units QD  Inpatient Diabetes Program Recommendations:  Noted glucose trends increased above inpatient goals.   Consider increasing Levemir to 7 units QD.  Given Ensure and elevations >180 mg/dL, consider adding coverage with nutritional supplements following biopsy: Novolog 2 units (1000, 1400).  Also, noted GFR < 45 and AKI on admission, therefore would not recommend metformin on discharge.  Thanks, Bronson Curb, MSN, RNC-OB Diabetes Coordinator [phone] (8a-5p)

## 2018-12-11 NOTE — Progress Notes (Signed)
Physical Therapy Treatment Patient Details Name: Nathan Davis MRN: [id number] DOB: 10-02-1945 Today's Date: 12/11/2018    History of Present Illness Pt is a 73 y/o male with a PMH significant for HTN, anemia, history of hernia x2 s/p repair x2, DM II, CLL. Pt presents with progressive weakness over ~4 weeks and reportedly was independent PTA. Imaging revealed mild compression in virtually all vertebral bodies, with new compression deformity in T10-T12 (osteopenia vs metastatic).      PT Comments    Pt with orthostatic hypotension this session which limited tolerance for functional mobility. Pt received sitting on BSC and BP 65/43. Pt initially refusing to transfer back to bed, however became increasingly lethargic and required increased cues to keep eyes open. Therapists assisted pt back to bed and positioned in supine with HOB slightly elevated (10). BP at end of session was 103/63 and RN notified. Note that pt is to d/c home (refusing SNF). Asked MD for Palliative Care consult to help pt/family plan for transition home, and any additional services that may be available to assist family with care. Will continue to follow.    Follow Up Recommendations  SNF;Supervision/Assistance - 24 hour     Equipment Recommendations  Rolling walker with 5" wheels;3in1 (PT)    Recommendations for Other Services       Precautions / Restrictions Precautions Precautions: Fall Precaution Comments: Pt reports a fall off of his porch an unspecified time ago (sounded recent?) Restrictions Weight Bearing Restrictions: No    Mobility  Bed Mobility Overal bed mobility: Needs Assistance Bed Mobility: Sit to Supine       Sit to supine: Max assist   General bed mobility comments: Pt required max assist to elevate LE's back up to bed. +2 to scoot pt up to [facility] with bed pad.   Transfers Overall transfer level: Needs assistance Equipment used: 2 person hand held assist Transfers: Sit to/from  [facility] Sit to Stand: Mod assist;+2 physical assistance Stand pivot transfers: Min assist;+2 physical assistance       General transfer comment: +2 to power up to full stand and pivot around to the bed.   Ambulation/Gait             General Gait Details: Unsafe to progress to gait training due to orthostatic hypotension   Stairs             Wheelchair Mobility    Modified Rankin (Stroke Patients Only)       Balance Overall balance assessment: Needs assistance Sitting-balance support: Feet supported;No upper extremity supported Sitting balance-Leahy Scale: Fair     Standing balance support: No upper extremity supported Standing balance-Leahy Scale: Poor Standing balance comment: Reliant on UE support throughout OOB mobility                            Cognition Arousal/Alertness: Awake/alert Behavior During Therapy: WFL for tasks assessed/performed Overall Cognitive Status: Impaired/Different from baseline Area of Impairment: Orientation;Problem solving                 Orientation Level: Disoriented to;Time           Problem Solving: Slow processing        Exercises      General Comments        Pertinent Vitals/Pain Pain Assessment: No/denies pain    Home Living  Prior Function            PT Goals (current goals can now be found in the care plan section) Acute Rehab PT Goals Patient Stated Goal: Return home PT Goal Formulation: With patient Time For Goal Achievement: 12/22/18 Potential to Achieve Goals: Good Progress towards PT goals: Progressing toward goals    Frequency    Min 2X/week      PT Plan Current plan remains appropriate    Co-evaluation PT/OT/SLP Co-Evaluation/Treatment: Yes Reason for Co-Treatment: Complexity of the patient's impairments (multi-system involvement);To address functional/ADL transfers PT goals addressed during session:  Mobility/safety with mobility;Balance        AM-PAC PT "6 Clicks" Mobility   Outcome Measure  Help needed turning from your back to your side while in a flat bed without using bedrails?: None Help needed moving from lying on your back to sitting on the side of a flat bed without using bedrails?: A Little Help needed moving to and from a bed to a chair (including a wheelchair)?: A Lot Help needed standing up from a chair using your arms (e.g., wheelchair or bedside chair)?: A Lot Help needed to walk in hospital room?: A Little Help needed climbing 3-5 steps with a railing? : Total 6 Click Score: 15    End of Session Equipment Utilized During Treatment: Gait belt Activity Tolerance: Patient limited by fatigue Patient left: in bed;with call bell/phone within reach;with bed alarm set Nurse Communication: Mobility status PT Visit Diagnosis: Unsteadiness on feet (R26.81);Muscle weakness (generalized) (M62.81);Difficulty in walking, not elsewhere classified (R26.2)     Time: 2751-7001 PT Time Calculation (min) (ACUTE ONLY): 23 min  Charges:  $Gait Training: 8-22 mins                     [name], PT, DPT Acute Rehabilitation Services Pager: [phone] Office: [phone]    [name] 12/11/2018, 10:30 AM

## 2018-12-11 NOTE — Procedures (Signed)
Pre Procedure Dx: Worsening renal function Post Procedural Dx: Same  Technically successful US guided biopsy of inferior pole of the left kidney.   EBL: None  No immediate complications.   Ronny Bacon, MD Pager #: [phone]

## 2018-12-11 NOTE — TOC Progression Note (Signed)
Transition of Care [facility] - Progression Note    Patient Details  Name: Nathan Davis MRN: [id number] Date of Birth: 1946/05/14  Transition of Care [facility]) CM/SW Contact  Carles Collet, RN Phone Number: 12/11/2018, 10:20 AM  Clinical Narrative:     Damaris Schooner w patient's wife to establish DC plan. SHe would like DME rec by PT, 3/1 and RW. Order placed. NEEDS: referral to [facility] for DME to brought to room prior to DC. Discussed wife to wipe down with disinfecting wipes prior to using at home. Reviewed Medicare quality list for [facility] providers and she would like to use [facility]. Referral placed for PT and OT. Preferred [facility] can accept with these disciplines as nursing is limited. They are able to assess need for nursing and can establish it once patient is home. NEEDS: HH orders for PT OT    Expected Discharge Plan: [facility](Patient's wife does not want SNF) Barriers to Discharge: Continued Medical Work up  Expected Discharge Plan and Services Expected Discharge Plan: [facility](Patient's wife does not want SNF) In-house Referral: Clinical Social Work Discharge Planning Services: CM Consult Post Acute Care Choice: Home Health(Patient agreeable to [facility] rehab) Living arrangements for the past 2 months: Single Family Home                 DME Arranged: 3-N-1, Walker rolling DME Agency: [facility] HH Arranged: PT, OT HH Agency: [facility]   Social Determinants of Health (SDOH) Interventions    Readmission Risk Interventions No flowsheet data found.

## 2018-12-11 NOTE — Progress Notes (Signed)
Patient's wife relayed to unit's secretary that ,patient refused to go on his scheduled kidney biopsy.With his wife on the phone line,me and the unit's asst.director went into patient's room as witness ,patient said'' idont want to do that procedure and I want to go home''.Will call M.D.

## 2018-12-11 NOTE — Progress Notes (Addendum)
PROGRESS NOTE    Nathan Davis  WPY:[id number] DOB: 05/30/1946 DOA: 12/15/2018 PCP: [name], MD   Brief Narrative: Patient is a 73 year old male with past medical history of CLL diagnosed in 2017, on Imbruvica, iron deficiency anemia, hypertension, hyperlipidemia, diabetes type 2, CKD stage III who follows with oncology  Dr. Morene Rankins who presented to the ED for  evaluation of progressive weakness, fatigue, joint pain and weight loss.  Patient reported progressive worsening of loss of appetite, general weakness.  Patient found to have acute kidney injury on presentation, found to be dehydrated, hyperkalemic, hypercalcemic.  Also found to have abnormal LFTs.  Nephrology consulted for persistent acute kidney injury.  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   Assessment & Plan:   Principal Problem:   AKI (acute kidney injury) ([place]) Active Problems:   Type 2 diabetes mellitus with stage 3 chronic kidney disease, with long-term current use of insulin (HCC)   CLL (chronic lymphocytic leukemia) (HCC)   Lumbar compression fracture (HCC)   Hypercalcemia   Acute liver failure   Acute on chronic CKD stage III: Baseline creatinine around 1.2.  Presented with creatinine of more than 2.5.  Suspected initially to be from dehydration from poor oral intake and hypercalcemia.  CT abdomen/pelvis did not show any acute intra-abdominal pathology.  No clinical findings of urinary retention.  Bladder scan did not show any significant retention.  Kidney function has not improved with IV fluids. Also thought to be secondary to Imbruvica but it is less likely. 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  Possible CLL involvement in the kidneys. Renal biopsy today. IV fluids will be stopped.  Hypercalcemia: Improved. He was given few doses of  calcitonin.    PTH related peptide pending.  Home vitamin D on hold.  Likely this is hypercalcemia of malignancy.  Abnormal LFTs: Unclear etiology.Mild.  Might be related with CLL or Imbruvica.  Imbruvica discontinued on 3/10.  No GI symptoms were acute hepatitis panel negative.  CLL/leukocytosis: H&H stable.  Follows with oncology Dr. Alen Blew.  Was also on prednisone.  On Imbruvica which is on hold. Urine culture did not show any growth.  Bibasilar crackles:Stable bibasilar scarring as per  chest x-ray.  No clinical evidence of pulmonary edema.  Diabetes type 2 with hyperglycemia: Hemoglobin A1c of 7.6 on 10/02/2018.  On Levemir at home, metformin at home.Continue current regimen.  Hypertension: Might have orthostatic hypotension now. Atenolol on hold.IV fluids stopped due to developing edema.  Hyperlipidemia: Statins on hold due to elevated LFTs.  Iron deficiency anemia: Stable  Vertebral compression fractures: CT showed mild compression deformities of all vertebral bodies of lumbar sacral spine, new compression deformity of T10-T12.  No back pain reported.  Continue supportive care  Prostatomegaly: Follow-up with urology as an outpatient.  No urinary retention at present.Foley inserted.  Hyperkalemia: Will be given a dose of Lokelma.  Abdominal pelvic ascites: Moderate on CT scan but clinically not severe to be tapped.  Confusion/altered mental status: Might be multifactorial.  Probably hospital induced delirium, uremia or metabolic encephalopathy.  Not agitated.  We will continue to monitor his mental status.  Generalized deconditioning/failure to thrive/multiple comorbidities: Physical therapy evaluated the patient recommended skilled nursing facility on discharge.  Social worker consulted.    Nutrition Problem: Moderate Malnutrition Etiology: chronic illness      DVT prophylaxis: Heparin [place] Code Status: Full Family Communication:  Discussed with wife on phone on 12/10/18  Disposition Plan: Skilled nursing facility after full work-up   Consultants: Oncology, nephrology  Procedures: None  Antimicrobials:  Anti-infectives (From admission, onward)   None      Subjective: Patient seen and examined the bedside this morning.  Currently hemodynamically stable.  Denies any complaints.  Patient was confused this morning.  Looks very weak.  Objective: Vitals:   12/10/18 1703 12/10/18 2214 12/11/18 0515 12/11/18 0855  BP: (!) 105/56 (!) 98/55 (!) 107/55 (!) 104/52  Pulse: (!) 108 (!) 102 99 (!) 105  Resp: 18 18 18 18   Temp: 97.8 F (36.6 C) 98.6 F (37 C) 98.2 F (36.8 C) (!) 97.4 F (36.3 C)  TempSrc: Oral Oral Oral Oral  SpO2: 95% 96% (!) 85% 92%  Weight:  71 kg    Height:        Intake/Output Summary (Last 24 hours) at 12/11/2018 0925 Last data filed at 12/11/2018 0826 Gross per 24 hour  Intake 945 ml  Output 450 ml  Net 495 ml   Filed Weights   12/08/18 2047 12/09/18 2113 12/10/18 2214  Weight: 70.9 kg 71 kg 71 kg    Examination:  General exam: Appears calm and comfortable ,chronically ill looking,weak HEENT:PERRL,Oral mucosa moist, Ear/Nose normal on gross exam Respiratory system: Bilateral decreased air entry at the bases Cardiovascular system: S1 & S2 heard, RRR. No JVD, murmurs, rubs, gallops or clicks. Gastrointestinal system: Abdomen is nondistended, soft and nontender. No organomegaly or masses felt. Normal bowel sounds heard. Central nervous system: Alert ,oriented to place and person only. No focal neurological deficits. Extremities: Lateral trace lower extremity edema, no clubbing ,no cyanosis, distal peripheral pulses palpable. Skin: No rashes, lesions or ulcers,no icterus ,no pallor   Data Reviewed: I have personally reviewed following labs and imaging studies  CBC: Recent Labs  Lab 12/07/18 0333 12/08/18 0750 12/09/18 0359 12/10/18 0510 12/11/18 0508  WBC 20.4* 23.7* 24.0* 26.3* 23.6*  NEUTROABS  --  11.1*  --   20.3* 20.5*  HGB 8.8* 9.0* 9.0* 9.4* 9.1*  HCT 26.6* 26.0* 26.5* 27.0* 26.4*  MCV 65.2* 64.7* 63.4* 63.5* 63.0*  PLT 390 431* 387 405* 595   Basic Metabolic Panel: Recent Labs  Lab 12/07/18 0333 12/08/18 0750 12/09/18 0359 12/10/18 0510 12/11/18 0508  NA 129* 131* 134* 133* 133*  K 4.9 4.5 4.5 5.4* 5.4*  CL 101 104 106 105 106  CO2 20* 18* 19* 20* 18*  GLUCOSE 241* 99 72 199* 212*  BUN 63* 66* 69* 86* 104*  CREATININE 2.42* 2.51* 2.55* 2.96* 3.94*  CALCIUM 9.4 9.3 9.6 9.0 8.7*   GFR: Estimated Creatinine Clearance: 16.2 mL/min (A) (by C-G formula based on SCr of 3.94 mg/dL (H)). Liver Function Tests: Recent Labs  Lab 11/30/2018 2144 12/07/18 0333 12/08/18 0750 12/09/18 0359 12/11/18 0508  AST 136* 130* 133* 116* 76*  ALT 82* 81* 88* 80* 61*  ALKPHOS 335* 320* 344* 346* 360*  BILITOT 4.0* 4.2* 4.7* 5.0* 5.8*  PROT 4.8* 4.6* 4.6* 4.5* 4.3*  ALBUMIN 1.8* 1.7* 1.7* 1.6* 1.4*   Recent Labs  Lab 12/11/2018 2144  LIPASE 38   Recent Labs  Lab 12/07/18 0333  AMMONIA 43*   Coagulation Profile: Recent Labs  Lab 12/07/18 0333  INR 1.4*   Cardiac Enzymes: No results for input(s): CKTOTAL, CKMB, CKMBINDEX, TROPONINI in the last 168 hours. BNP (last 3 results) No results for input(s): PROBNP in the last 8760 hours. HbA1C: No results for  input(s): HGBA1C in the last 72 hours. CBG: Recent Labs  Lab 12/09/18 2113 12/10/18 0653 12/10/18 1125 12/10/18 2212 12/11/18 0700  GLUCAP 171* 213* 245* 255* 189*   Lipid Profile: No results for input(s): CHOL, HDL, LDLCALC, TRIG, CHOLHDL, LDLDIRECT in the last 72 hours. Thyroid Function Tests: No results for input(s): TSH, T4TOTAL, FREET4, T3FREE, THYROIDAB in the last 72 hours. Anemia Panel: No results for input(s): VITAMINB12, FOLATE, FERRITIN, TIBC, IRON, RETICCTPCT in the last 72 hours. Sepsis Labs: No results for input(s): PROCALCITON, LATICACIDVEN in the last 168 hours.  Recent Results (from the past 240 hour(s))   Culture, Urine     Status: None   Collection Time: 12/10/18  9:00 AM  Result Value Ref Range Status   Specimen Description URINE, CLEAN CATCH  Final   Special Requests NONE  Final   Culture   Final    NO GROWTH Performed at [facility], [address] [place], [place] [address]    Report Status 12/11/2018 FINAL  Final         Radiology Studies: No results found.      Scheduled Meds: . feeding supplement (ENSURE ENLIVE)  237 mL Oral BID BM  . insulin aspart  0-5 Units Subcutaneous QHS  . insulin aspart  0-9 Units Subcutaneous TID WC  . insulin detemir  5 Units Subcutaneous Daily  . mirtazapine  15-30 mg Oral QHS  . sodium zirconium cyclosilicate  10 g Oral Daily   Continuous Infusions: . sodium chloride 75 mL/hr at 12/10/18 2211     LOS: 4 days    Time spent: 25 mins.More than 50% of that time was spent in counseling and/or coordination of care.      Shelly Coss, MD [facility] Pager [phone]  If 7PM-7AM, please contact night-coverage [email or web address] Password [facility] 12/11/2018, 9:25 AM

## 2018-12-11 NOTE — Progress Notes (Signed)
Nephrologist approached this R.N.,as he came out from the patient's room,he said "patient agreed to go on his scheduled kidney biopsy".I.R  made aware that patient still on board for this procedure.

## 2018-12-11 NOTE — Care Management Important Message (Signed)
Important Message  Patient Details  Name: Nathan Davis MRN: [id number] Date of Birth: 1946-07-26   Medicare Important Message Given:  Yes    [name] 12/11/2018, 4:29 PM

## 2018-12-11 NOTE — Progress Notes (Signed)
[facility] Progress Note    73 y.o.maleCLL DM HTN CKD3 here with anorexia and weakness for a few days. Imbruvica was stopped on 3/10 becof SE.Cr was 1.28 11/25/2018 and has fluctuated from 1.2-1.5 in 2019.He has had very poor appetite for solids and fluids with malaise over the past 2-3 weeks even after the Imbruvica was discontinued.SomeNSAIDuse a week ago.  Assessment/ Plan:   1. Renal failure - pt is positive >1.5L during this hospitalization and renal function is stable but not improved. Certainly from his trends this is AKI with a creatinine of 1.28 on 11/25/2018. Would have thought this is prerenal azotemia vs progression to ATN and he has not improved much with hydration. No e/o obstruction; sometimes specific tyrosine kinase inhibitors can cause tubular dysfunction. - CXR PA/lateral suggests bilateral scarring which explains the crackles as there does not appear to be pulmonary edema.  - UPC 11.9 likely from today; SPEP monoclonal IgG kappa. Microalb/cr 5.4 which is consistent from a paraprotein. - I personally revieweda microscopy 3/24 -> 0-5 WBC/HPF but otherwise bland sediment.  - Less likely to be obstruction as no hydronephrosis present.  - Has not responded to fluids and has lower ext edema -> IVF stopped 3/26.   - Plan for renal biopsy tomorrow; may be kappa based infiltrative tubular damage/ LCDD vs ATN vs MCD vs membranous   - I spoke with the spouse [place] [phone] 3/25 and also this AM to update her of what's going on. She wants the biopsy to proceed and then make decisions from there. If dialysis is eventually needed she will cross the bridge at that point but I did discuss the possibility of HD with her today. It appears she would want dialysis to support him to allow chemo to be given.  - Also d/w interventional and he is on the list for a renal biopsy today.  2. DM 3. CLL 4. HTN  Subjective:   Complaining mainly of  thirst. IVF had to be stopped bec of incr LE edema   Objective:   BP (!) 107/55 (BP Location: Right Arm)   Pulse 99   Temp 98.2 F (36.8 C) (Oral)   Resp 18   Ht 5\' 8"  (1.727 m)   Wt 71 kg   SpO2 (!) 85%   BMI 23.80 kg/m   Intake/Output Summary (Last 24 hours) at 12/11/2018 0842 Last data filed at 12/11/2018 0826 Gross per 24 hour  Intake 945 ml  Output 450 ml  Net 495 ml   Weight change: 0 kg  Physical Exam: GEN: NAD, A&Ox3, NCAT HEENT: No conjunctival pallor, EOMI NECK: Supple, no thyromegaly LUNGS:  rales CV: RRR, No M/R/G ABD: SNDNT +BS  EXT:1+ pretibialedema GU:  Foley   Imaging: No results found.  Labs: BMET Recent Labs  Lab 11/16/2018 2144 12/07/18 0333 12/08/18 0750 12/09/18 0359 12/10/18 0510 12/11/18 0508  NA 133* 129* 131* 134* 133* 133*  K 5.4* 4.9 4.5 4.5 5.4* 5.4*  CL 103 101 104 106 105 106  CO2 19* 20* 18* 19* 20* 18*  GLUCOSE 287* 241* 99 72 199* 212*  BUN 64* 63* 66* 69* 86* 104*  CREATININE 2.51* 2.42* 2.51* 2.55* 2.96* 3.94*  CALCIUM 10.0 9.4 9.3 9.6 9.0 8.7*   CBC Recent Labs  Lab 12/08/18 0750 12/09/18 0359 12/10/18 0510 12/11/18 0508  WBC 23.7* 24.0* 26.3* 23.6*  NEUTROABS 11.1*  --  20.3* 20.5*  HGB 9.0* 9.0* 9.4* 9.1*  HCT 26.0* 26.5* 27.0* 26.4*  MCV  64.7* 63.4* 63.5* 63.0*  PLT 431* 387 405* 370    Medications:    . feeding supplement (ENSURE ENLIVE)  237 mL Oral BID BM  . insulin aspart  0-5 Units Subcutaneous QHS  . insulin aspart  0-9 Units Subcutaneous TID WC  . insulin detemir  5 Units Subcutaneous Daily  . mirtazapine  15-30 mg Oral QHS      Otelia Santee, MD 12/11/2018, 8:42 AM

## 2018-12-11 NOTE — Evaluation (Signed)
Occupational Therapy Evaluation Patient Details Name: Nathan Davis MRN: [id number] DOB: 03-May-1946 Today's Date: 12/11/2018    History of Present Illness Pt is a 73 y/o male with a PMH significant for HTN, anemia, history of hernia x2 s/p repair x2, DM II, CLL. Pt presents with progressive weakness over ~4 weeks and reportedly was independent PTA. Imaging revealed mild compression in virtually all vertebral bodies, with new compression deformity in T10-T12 (osteopenia vs metastatic).     Clinical Impression   Pt admitted with the above diagnoses and presents with below problem list. Pt will benefit from continued acute OT to address the below listed deficits and maximize independence with basic ADLs.  PTA pt was needing some assist from spouse with ADLs and used a cane. Pt presents with acute orthostatic hypotension limiting session as well as generalized weakness. Pt received sitting on BSC and BP 65/43, symptomatic. Assisted back to bed with +2 HHA. BP at end of session in supine was 103/63. RN notified. Pt with eyes closed most of session but answering questions appropriately. Noted that pt likely to d/c home, recommend maximizing [facility] services including addition of a [place] aide. A palliative consult would also be beneficial for establishing GOC and providing resources for the pt and spouse. Pt thanking therapy for working with him at end of session.     Follow Up Recommendations  SNF;Supervision/Assistance - 24 hour    Equipment Recommendations  Other (comment);3 in 1 bedside commode(3n1 if d/c home otherwise defer to next venue)    Recommendations for Other Services  Palliative consult     Precautions / Restrictions Precautions Precautions: Fall Precaution Comments: Pt reports a fall off of his porch an unspecified time ago (sounded recent?) Restrictions Weight Bearing Restrictions: No      Mobility Bed Mobility Overal bed mobility: Needs Assistance Bed Mobility: Sit to  Supine       Sit to supine: Max assist   General bed mobility comments: Pt required max assist to elevate LE's back up to bed. +2 to scoot pt up to [facility] with bed pad.   Transfers Overall transfer level: Needs assistance Equipment used: 2 person hand held assist Transfers: Sit to/from [facility] Sit to Stand: Mod assist;+2 physical assistance Stand pivot transfers: Min assist;+2 physical assistance       General transfer comment: +2 to power up to full stand and pivot around to the bed.     Balance Overall balance assessment: Needs assistance Sitting-balance support: Feet supported;No upper extremity supported Sitting balance-Leahy Scale: Fair     Standing balance support: No upper extremity supported Standing balance-Leahy Scale: Poor Standing balance comment: Reliant on UE support throughout OOB mobility                           ADL either performed or assessed with clinical judgement   ADL Overall ADL's : Needs assistance/impaired Eating/Feeding: Set up;Minimal assistance;Sitting   Grooming: Moderate assistance;Sitting   Upper Body Bathing: Maximal assistance;Sitting   Lower Body Bathing: +2 for physical assistance;Moderate assistance;Sit to/from stand;Maximal assistance   Upper Body Dressing : Maximal assistance;Sitting   Lower Body Dressing: +2 for physical assistance;Moderate assistance   Toilet Transfer: Minimal assistance;+2 for physical assistance;Stand-pivot   Toileting- Clothing Manipulation and Hygiene: Moderate assistance;Sit to/from stand;+2 for physical assistance         General ADL Comments: SPT from [facility] to EOB. Pt feeling very poorly. Eyes closed most of session, responsive and  appropriate responses.      Vision         Perception     Praxis      Pertinent Vitals/Pain Pain Assessment: No/denies pain     Hand Dominance     Extremity/Trunk Assessment Upper Extremity Assessment Upper Extremity Assessment:  Generalized weakness   Lower Extremity Assessment Lower Extremity Assessment: Generalized weakness   Cervical / Trunk Assessment Cervical / Trunk Assessment: Kyphotic;Other exceptions Cervical / Trunk Exceptions: Forward head posture with rounded shoulders   Communication     Cognition Arousal/Alertness: Awake/alert Behavior During Therapy: WFL for tasks assessed/performed Overall Cognitive Status: Impaired/Different from baseline Area of Impairment: Orientation;Problem solving                 Orientation Level: Disoriented to;Time           Problem Solving: Slow processing     General Comments       Exercises     Shoulder Instructions      Home Living Family/patient expects to be discharged to:: Private residence Living Arrangements: Spouse/significant other Available Help at Discharge: Family;Available 24 hours/day Type of Home: House Home Access: Stairs to enter [employer] of Steps: 12   Home Layout: Two level;Bed/bath upstairs Alternate Level Stairs-Number of Steps: 15   Bathroom Shower/Tub: Tub/shower unit;Walk-in shower   Bathroom Toilet: Standard     Home Equipment: Cane - single point          Prior Functioning/Environment Level of Independence: Needs assistance  Gait / Transfers Assistance Needed: Using a cane for balance ADL's / Homemaking Assistance Needed: Wife assisting with ADL's - pt reporting he has not attempted to get in the shower in over a week due to feeling weak.             OT Problem List: Decreased activity tolerance;Decreased strength;Impaired balance (sitting and/or standing);Decreased knowledge of use of DME or AE;Decreased knowledge of precautions;Cardiopulmonary status limiting activity;Pain      OT Treatment/Interventions: Self-care/ADL training;Therapeutic exercise;Energy conservation;DME and/or AE instruction;Therapeutic activities;Patient/family education;Balance training    OT Goals(Current goals  can be found in the care plan section) Acute Rehab OT Goals Patient Stated Goal: Return home OT Goal Formulation: With patient Time For Goal Achievement: 12/25/18 Potential to Achieve Goals: Good ADL Goals Pt Will Perform Grooming: with min assist;sitting Pt Will Perform Upper Body Bathing: with min assist;sitting Pt Will Perform Lower Body Bathing: with mod assist;sit to/from stand Pt Will Perform Upper Body Dressing: with min assist;sitting Pt Will Perform Lower Body Dressing: with mod assist;sit to/from stand Pt Will Transfer to Toilet: with min assist;stand pivot transfer;bedside commode Pt Will Perform Toileting - Clothing Manipulation and hygiene: with min assist;sit to/from stand Pt/caregiver will Perform Home Exercise Program: Increased strength;Both right and left upper extremity;With written HEP provided;With theraband;With Supervision Additional ADL Goal #1: Pt will complete bed mobility at min A level to prepare for EOB/OOB ADLs.  OT Frequency: Min 2X/week   Barriers to D/C: Decreased caregiver support  pt currently needing +2 assist for transfers       Co-evaluation PT/OT/SLP Co-Evaluation/Treatment: Yes Reason for Co-Treatment: Complexity of the patient's impairments (multi-system involvement);To address functional/ADL transfers PT goals addressed during session: Mobility/safety with mobility;Balance        AM-PAC OT "6 Clicks" Daily Activity     Outcome Measure Help from another person eating meals?: A Little Help from another person taking care of personal grooming?: A Lot Help from another person toileting, which includes using toliet, bedpan,  or urinal?: A Lot Help from another person bathing (including washing, rinsing, drying)?: A Lot Help from another person to put on and taking off regular upper body clothing?: A Lot Help from another person to put on and taking off regular lower body clothing?: A Lot 6 Click Score: 13   End of Session Equipment Utilized  During Treatment: Gait belt;Rolling walker Nurse Communication: Mobility status;Other (comment)(low bp and symptomatic)  Activity Tolerance: Patient limited by fatigue;Other (comment)(feeling very poorly, low bp on [facility]) Patient left: in bed;with call bell/phone within reach;with bed alarm set  OT Visit Diagnosis: Unsteadiness on feet (R26.81);Muscle weakness (generalized) (M62.81);History of falling (Z91.81)                Time: 0017-4944 OT Time Calculation (min): 22 min Charges:  OT General Charges $OT Visit: 1 Visit OT Evaluation $OT Eval Low Complexity: [place], OT Acute Rehabilitation Services Pager: [phone] Office: [phone]   Hortencia Pilar 12/11/2018, 12:26 PM

## 2018-12-11 NOTE — Consult Note (Signed)
Chief Complaint: Patient was seen in consultation today for random renal biopsy Chief Complaint  Patient presents with  . Anorexia   at the request of Dr Corliss Marcus   Supervising Physician: Sandi Mariscal  Patient Status: [facility] - In-pt  History of Present Illness: Nathan Davis is a 73 y.o. male   Hx CLL; DM; HTN; CKD3 Anorexia and weakness Wt loss  Renal failure; persistent-- no improvement with hydration Acute kidney injury  Dr Augustin Coupe note: Plan for renal biopsy tomorrow; may be kappa based infiltrative tubular damage/ LCDD vs ATN vs MCD vs membranous   Scheduled now for same   Past Medical History:  Diagnosis Date  . Cataract   . CLL (chronic lymphocytic leukemia) ([place])    not treated yet  . Colon polyps   . Diabetes ([place]) 1992   type 2  . ED (erectile dysfunction)   . Femur fracture ([facility])    left  . Heart murmur   . History of hiatal hernia   . Iron (Fe) deficiency anemia   . Obesity   . Other and unspecified hyperlipidemia   . Premature beats, unspecified   . Unspecified essential hypertension   . Vitamin D deficiency     Past Surgical History:  Procedure Laterality Date  . ETT - MYOVIEW     7'45", stopped due to fatigue, no chest pain. EF55%, no ischemia or infarction  . HERNIA REPAIR     hiatal hernia  1994 , inguinal scheduled 01-02-18 with dr. Coralie Keens  . INGUINAL HERNIA REPAIR Bilateral 02/06/2018   Procedure: LAPAROSCOPIC BILATERAL INGUINAL HERNIA REPAIR;  Surgeon: Coralie Keens, MD;  Location: [place];  Service: General;  Laterality: Bilateral;  . INSERTION OF MESH Bilateral 02/06/2018   Procedure: INSERTION OF MESH;  Surgeon: Coralie Keens, MD;  Location: [place];  Service: General;  Laterality: Bilateral;  . nissan fundoplication N/A 19/14/7829    Allergies: Amaryl [glimepiride]; Codeine; and Pioglitazone  Medications: Prior to Admission medications   Medication Sig Start Date End Date  Taking? Authorizing Provider  acetaminophen (TYLENOL) 500 MG tablet Take 1,000 mg by mouth every 6 (six) hours as needed for moderate pain or headache.   Yes [provider]  aspirin 81 MG tablet Take 81 mg by mouth daily.   Yes [provider]  atenolol (TENORMIN) 25 MG tablet Take 0.5 tablets (12.5 mg total) by mouth at bedtime. 10/02/18  Yes Liane Comber, NP  atorvastatin (LIPITOR) 80 MG tablet TAKE 1 TABLET A DAY Patient taking differently: Take 80 mg by mouth daily at 6 PM.  07/07/18  Yes [name], MD  Cholecalciferol (VITAMIN D3) 1000 units CAPS Take 2,000 Units by mouth daily.   Yes [provider]  LEVEMIR FLEXTOUCH 100 UNIT/ML Pen INJECT 30 UNITS INTO THE SKIN DAILY Patient taking differently: Inject 30 Units into the skin daily.  05/16/18  Yes [name], MD  loratadine (CLARITIN) 10 MG tablet Take 1 tablet daily for Allergies Patient taking differently: Take 10 mg by mouth daily as needed for allergies.  09/18/17  Yes Vicie Mutters, PA-C  Magnesium 250 MG TABS Take 1 tablet (250 mg total) by mouth 2 (two) times daily with a meal. 10/03/18  Yes Liane Comber, NP  metFORMIN (GLUCOPHAGE) 1000 MG tablet TAKE ONE TABLET BY MOUTH TWICE A DAY Patient taking differently: Take 1,000 mg by mouth 2 (two) times daily with a meal.  07/14/18  Yes [name], MD  mirtazapine (REMERON)  30 MG tablet Take 1/2 to 1 tablet 1 hour before bedtime  for appetite & sleep Patient taking differently: Take 30 mg by mouth at bedtime.  03/25/18  Yes [name], MD  Tetrahydrozoline HCl (VISINE OP) Place 2 drops into both eyes daily as needed (for dryness).    Yes [provider]  IMBRUVICA 420 MG TABS TAKE 1 TABLET (420 MG) BY MOUTH DAILY. TAKE WITH A GLASS OF WATER AT APPROX THE SAME TIME EACH DAY. Patient not taking: TAKE WITH A GLASS OF WATER AT APPROX THE SAME TIME EACH DAY 10/13/18   Wyatt Portela, MD  Insulin Pen Needle (B-D ULTRAFINE III SHORT  PEN) 31G X 8 MM MISC USE DAILY WITH LEVEMIR. DX-E11.29. 05/28/18   [name], MD     Family History  Problem Relation Age of Onset  . Throat cancer Mother   . Alzheimer's disease Father   . Hypertension Other        family history  . Heart attack Other 59    Social History   Socioeconomic History  . Marital status: Married    Spouse name: Not on file  . Number of children: Not on file  . Years of education: Not on file  . Highest education level: Not on file  Occupational History  . Not on file  Social Needs  . Financial resource strain: Not on file  . Food insecurity:    Worry: Not on file    Inability: Not on file  . Transportation needs:    Medical: Not on file    Non-medical: Not on file  Tobacco Use  . Smoking status: Never Smoker  . Smokeless tobacco: Never Used  Substance and Sexual Activity  . Alcohol use: No  . Drug use: No  . Sexual activity: Not Currently  Lifestyle  . Physical activity:    Days per week: Not on file    Minutes per session: Not on file  . Stress: Not on file  Relationships  . Social connections:    Talks on phone: Not on file    Gets together: Not on file    Attends religious service: Not on file    Active member of club or organization: Not on file    Attends meetings of clubs or organizations: Not on file    Relationship status: Not on file  Other Topics Concern  . Not on file  Social History Narrative  . Not on file     Review of Systems: A 12 point ROS discussed and pertinent positives are indicated in the HPI above.  All other systems are negative.  Review of Systems  Constitutional: Positive for activity change, appetite change, fatigue and unexpected weight change. Negative for fever.  Respiratory: Positive for shortness of breath.   Cardiovascular: Negative for chest pain.  Gastrointestinal: Negative for abdominal pain.  Neurological: Positive for weakness.  Psychiatric/Behavioral: Positive for decreased  concentration.    Vital Signs: BP (!) 104/52 (BP Location: Right Arm)   Pulse (!) 105   Temp (!) 97.4 F (36.3 C) (Oral)   Resp 18   Ht 5\' 8"  (1.727 m)   Wt 156 lb 8.4 oz (71 kg)   SpO2 92%   BMI 23.80 kg/m   Physical Exam Vitals signs reviewed.  Cardiovascular:     Rate and Rhythm: Normal rate and regular rhythm.     Heart sounds: Normal heart sounds.  Pulmonary:     Breath sounds: Wheezing present.  Abdominal:  General: Bowel sounds are normal.  Musculoskeletal: Normal range of motion.  Skin:    General: Skin is warm and dry.  Neurological:     Mental Status: He is alert and oriented to person, place, and time.  Psychiatric:     Comments: Able to say name and DOB correctly Unable to tell me what we are to do today Where he is?  Spoke to wife via phone consent in chart     Imaging: Ct Abdomen Pelvis Wo Contrast  Result Date: 12/07/2018 CLINICAL DATA:  Back pain. History of renal failure. EXAM: CT ABDOMEN AND PELVIS WITHOUT CONTRAST TECHNIQUE: Multidetector CT imaging of the abdomen and pelvis was performed following the standard protocol without IV contrast. COMPARISON:  12/13/2017 FINDINGS: Lower chest: Hazy ground-glass opacities in bilateral lung bases. Bibasilar atelectasis. Small pericardial effusion. Calcific atherosclerotic disease of the aorta and coronary arteries. Hepatobiliary: No focal liver abnormality is seen. No gallstones, gallbladder wall thickening, or biliary dilatation. Pancreas: Unremarkable. No pancreatic ductal dilatation or surrounding inflammatory changes. Spleen: Normal in size without focal abnormality. Adrenals/Urinary Tract: Adrenal glands are unremarkable. Kidneys are normal, without renal calculi, focal lesion, or hydronephrosis. Diffuse thickening of the urinary bladder wall. Stomach/Bowel: Stomach is within normal limits. Appendix appears normal. No evidence of bowel wall thickening, distention, or inflammatory changes.  Vascular/Lymphatic: Aortic atherosclerosis. No enlarged abdominal or pelvic lymph nodes. Reproductive: Enlargement of the prostate gland. Other: Moderate amount of abdominopelvic ascites. Ascites containing left inguinal hernia. Musculoskeletal: Diffusely mottled appearance of the osseous structures. Mild compression deformities of virtually all vertebral bodies of the lumbosacral spine and lower thoracic spine. Healing or healed right-sided rib fractures. IMPRESSION: 1. Moderate amount of abdominopelvic ascites. 2. Hazy ground-glass opacities in bilateral lung bases, likely infectious or inflammatory. 3. Small pericardial effusion. 4. Diffusely mottled appearance of the osseous structures with mild compression deformities of virtually all vertebral bodies of the lumbosacral spine, mildly progressed. New compression deformity of T10 and T12 vertebral bodies. These findings may represent severe osteopenia or diffuse osseous metastatic disease. 5. Enlargement of the prostate gland with mild diffuse thickening of the urinary bladder wall, possibly due to outlet obstruction. Please correlate to serum PSA values. Electronically Signed   By: Fidela Salisbury M.D.   On: 12/07/2018 01:47   Dg Chest 2 View  Result Date: 12/08/2018 CLINICAL DATA:  Chest and back pain EXAM: CHEST - 2 VIEW COMPARISON:  December 06, 2018 chest radiograph and chest CT April 18, 2018 FINDINGS: There is a shallow degree of inspiration. There is scarring in each lung base, stable. There is no frank edema or consolidation. Heart is upper normal in size with pulmonary vascularity normal. No adenopathy. There is degenerative change in each shoulder. IMPRESSION: Stable bibasilar scarring. No new opacity evident. Stable cardiac silhouette. Electronically Signed   By: Lowella Grip III M.D.   On: 12/08/2018 14:01   Dg Chest 2 View  Result Date: 12/02/2018 CLINICAL DATA:  Cough productive of gray brown mucus for 3 weeks, generalized weakness,  nausea without vomiting, history type II diabetes mellitus, CLL, hypertension EXAM: CHEST - 2 VIEW COMPARISON:  03/26/2018 FINDINGS: Normal heart size, mediastinal contours, and pulmonary vascularity. Decreased lung volumes with bibasilar atelectasis slightly greater on LEFT. Central peribronchial thickening. Upper lungs clear. No pleural effusion or pneumothorax. Bones demineralized. IMPRESSION: Low lung volumes with bibasilar atelectasis and mild bronchitic changes. Electronically Signed   By: Lavonia Dana M.D.   On: 11/22/2018 22:32    Labs:  CBC: Recent Labs  12/08/18 0750 12/09/18 0359 12/10/18 0510 12/11/18 0508  WBC 23.7* 24.0* 26.3* 23.6*  HGB 9.0* 9.0* 9.4* 9.1*  HCT 26.0* 26.5* 27.0* 26.4*  PLT 431* 387 405* 370    COAGS: Recent Labs    12/07/18 0333  INR 1.4*  APTT 34    BMP: Recent Labs    12/08/18 0750 12/09/18 0359 12/10/18 0510 12/11/18 0508  NA 131* 134* 133* 133*  K 4.5 4.5 5.4* 5.4*  CL 104 106 105 106  CO2 18* 19* 20* 18*  GLUCOSE 99 72 199* 212*  BUN 66* 69* 86* 104*  CALCIUM 9.3 9.6 9.0 8.7*  CREATININE 2.51* 2.55* 2.96* 3.94*  GFRNONAA 24* 24* 20* 14*  GFRAA 28* 28* 23* 16*    LIVER FUNCTION TESTS: Recent Labs    12/07/18 0333 12/08/18 0750 12/09/18 0359 12/11/18 0508  BILITOT 4.2* 4.7* 5.0* 5.8*  AST 130* 133* 116* 76*  ALT 81* 88* 80* 61*  ALKPHOS 320* 344* 346* 360*  PROT 4.6* 4.6* 4.5* 4.3*  ALBUMIN 1.7* 1.7* 1.6* 1.4*    TUMOR MARKERS: No results for input(s): AFPTM, CEA, CA199, CHROMGRNA in the last 8760 hours.  Assessment and Plan:  Hx CLL CKD3 Renal failure AKI Scheduled for random renal biopsy Risks and benefits of random renal biopsy was discussed with the patient's wife [name] via phone  including, but not limited to bleeding, infection, damage to adjacent structures or low yield requiring additional tests.  All of the questions were answered and there is agreement to proceed. Consent signed and in chart.    Thank you for this interesting consult.  I greatly enjoyed meeting Nathan Davis and look forward to participating in their care.  A copy of this report was sent to the requesting provider on this date.  Electronically Signed: Lavonia Drafts, PA-C 12/11/2018, 12:13 PM   I spent a total of 20 Minutes    in face to face in clinical consultation, greater than 50% of which was counseling/coordinating care for random renal bx

## 2018-12-12 DIAGNOSIS — E440 Moderate protein-calorie malnutrition: Secondary | ICD-10-CM

## 2018-12-12 LAB — COMPREHENSIVE METABOLIC PANEL
ALT: 60 U/L — ABNORMAL HIGH (ref 0–44)
AST: 75 U/L — ABNORMAL HIGH (ref 15–41)
Albumin: 1.4 g/dL — ABNORMAL LOW (ref 3.5–5.0)
Alkaline Phosphatase: 361 U/L — ABNORMAL HIGH (ref 38–126)
Anion gap: 14 (ref 5–15)
BUN: 126 mg/dL — ABNORMAL HIGH (ref 8–23)
CO2: 15 mmol/L — ABNORMAL LOW (ref 22–32)
Calcium: 8.8 mg/dL — ABNORMAL LOW (ref 8.9–10.3)
Chloride: 104 mmol/L (ref 98–111)
Creatinine, Ser: 4.89 mg/dL — ABNORMAL HIGH (ref 0.61–1.24)
GFR calc Af Amer: 13 mL/min — ABNORMAL LOW (ref >60)
GFR calc non Af Amer: 11 mL/min — ABNORMAL LOW (ref >60)
Glucose, Bld: 266 mg/dL — ABNORMAL HIGH (ref 70–99)
Potassium: 6.2 mmol/L — ABNORMAL HIGH (ref 3.5–5.1)
Sodium: 133 mmol/L — ABNORMAL LOW (ref 135–145)
Total Bilirubin: 6.3 mg/dL — ABNORMAL HIGH (ref 0.3–1.2)
Total Protein: 4.1 g/dL — ABNORMAL LOW (ref 6.5–8.1)

## 2018-12-12 LAB — CBC WITH DIFFERENTIAL/PLATELET
Abs Immature Granulocytes: 1.7 10*3/uL — ABNORMAL HIGH (ref 0.00–0.07)
Basophils Absolute: 0.1 10*3/uL (ref 0.0–0.1)
Basophils Relative: 0 %
Eosinophils Absolute: 0 10*3/uL (ref 0.0–0.5)
Eosinophils Relative: 0 %
HCT: 24.6 % — ABNORMAL LOW (ref 39.0–52.0)
Hemoglobin: 8.8 g/dL — ABNORMAL LOW (ref 13.0–17.0)
Immature Granulocytes: 7 %
Lymphocytes Relative: 25 %
Lymphs Abs: 6.2 10*3/uL — ABNORMAL HIGH (ref 0.7–4.0)
MCH: 22.6 pg — ABNORMAL LOW (ref 26.0–34.0)
MCHC: 35.8 g/dL (ref 30.0–36.0)
MCV: 63.1 fL — ABNORMAL LOW (ref 80.0–100.0)
Monocytes Absolute: 2.3 10*3/uL — ABNORMAL HIGH (ref 0.1–1.0)
Monocytes Relative: 9 %
Neutro Abs: 14.5 10*3/uL — ABNORMAL HIGH (ref 1.7–7.7)
Neutrophils Relative %: 59 %
Platelets: 358 10*3/uL (ref 150–400)
RBC: 3.9 MIL/uL — ABNORMAL LOW (ref 4.22–5.81)
RDW: 23 % — ABNORMAL HIGH (ref 11.5–15.5)
WBC: 24.7 10*3/uL — ABNORMAL HIGH (ref 4.0–10.5)
nRBC: 0.1 % (ref 0.0–0.2)

## 2018-12-12 LAB — C3 COMPLEMENT: C3 Complement: 158 mg/dL (ref 82–167)

## 2018-12-12 LAB — C4 COMPLEMENT: Complement C4, Body Fluid: 89 mg/dL — ABNORMAL HIGH (ref 14–44)

## 2018-12-12 LAB — GLUCOSE, CAPILLARY
Glucose-Capillary: 303 mg/dL — ABNORMAL HIGH (ref 70–99)
Glucose-Capillary: 271 mg/dL — ABNORMAL HIGH (ref 70–99)

## 2018-12-17 NOTE — Code Documentation (Signed)
CODE BLUE NOTE  Patient Name: Nathan Davis   MRN: [id number]   Date of Birth/ Sex: [birth year] , male      Admission Date: 12/03/2018  Attending Provider: Shelly Coss, MD  Primary Diagnosis: afib    Indication: Pt was in his usual state of health until this AM, at 5:27 when he was noted to be unresponsive without pulse for an unknown period of time. Code blue was subsequently called. At the time of arrival on scene, ACLS protocol was underway.  Chest compressions were started approximately 5:27 AM.  Patient was found to be in PEA arrest.  Alternated chest compressions and epinephrine.  Total of 6 of epi were given.  Patient was noted to have elevated potassium early in the day.  Calcium gluconate, sodium bicarb, insulin with D10 were given.  2 times the patient was in V. fib and subsequently defibrillated but returned to PEA.  Time of death was called at 6 AM.   Technical Description:  - CPR performance duration:  30 minutes  - Was defibrillation or cardioversion used? Yes   - Was external pacer placed? No  - Was patient intubated pre/post CPR? Yes    Medications Administered: Y = Yes; Blank = No Amiodarone  Y  Atropine    Calcium  Y  Epinephrine  Y  Lidocaine    Magnesium    Norepinephrine    Phenylephrine    Sodium bicarbonate  Y  Vasopressin    Other Y    Post CPR evaluation:  - Final Status - Was patient successfully resuscitated ? No   Miscellaneous Information:  - Time of death:  0600 AM  - Primary team notified?  Yes  - Family Notified? Unable to reach during code        Benay Pike, MD   2019/01/10, 6:16 AM

## 2018-12-17 NOTE — Progress Notes (Signed)
   12/20/2018 0615  Clinical Encounter Type  Visited With Health care provider  Visit Type Initial;Code;Spiritual support  Referral From Nurse  Consult/Referral To Chaplain  The chaplain responded to the Code Blue by phone at 533 with no answer in the unit.  The chaplain successfully reached Unit Sec at 545 with info about the chaplain location in the ED.  The chaplain followed up at 615 with the Unit Sec on the unit.  The chaplain learned the Pt. did not survive the Code Blue and had no family present.  The chaplain understands the family has been called.  The Pt. wife is unsure of arrival at the hospital.  The chaplain offered F/U spiritual care as needed to the Unit Sec for the family.

## 2018-12-17 NOTE — Progress Notes (Signed)
   24-Dec-2018 0738  Clinical Encounter Type  Visited With Family;Other (Comment) (Pastor by phone)  Visit Type Follow-up;Spiritual support;Death  Referral From Chaplain  Consult/Referral To Chaplain  The chaplain returned the phone call from [phone], [name].  The pastor led prayer with the chaplain and Pt. Wife-[name].  The chaplain was pastorally present with the Pt. wife and son-[name] in the consult room.  [name] shared her grief and questions surrounding not being able to be with the Pt. until after he had died.  The family is waiting on other family members to arrive.  The chaplain connected the family with the upcoming transition to another chaplain.  The chaplain offered F/U spiritual care as needed.

## 2018-12-17 NOTE — Death Summary Note (Signed)
Death Summary  KEOKI MCHARGUE ZJI:[id number] DOB: 03/08/1946 DOA: 16-Dec-2018  PCP: [name], MD  Admit date: 12/16/18 Date of Death: 12/22/2018 Time of Death: 0600   History of present illness:  Patient is a 73 year old male with past medical history of CLL diagnosed in 2017, on [place], iron deficiency anemia, hypertension, hyperlipidemia, diabetes type 2, CKD stage III who follows with oncology  Dr. Morene Rankins who presented to the ED for  evaluation of progressive weakness, fatigue, joint pain and weight loss.  Patient reported progressive worsening of loss of appetite, general weakness.  Patient found to have acute kidney injury on presentation, found to be dehydrated, hyperkalemic, hypercalcemic.  Also found to have abnormal LFTs.  Nephrology consulted for persistent acute kidney injury.  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 Underwent kidney biopsy on 12/11/18. Patient was found to be unresponsive at around 5: 27 AM.Found to be in PEA arrest.  CODE BLUE called.  CPR restarted.  Reversal of circulation could not be obtained.  Death declared at 6 AM. Final Diagnoses:  1.   Acute kidney injury of unknown etiology but most likely secondary to CLL   The results of significant diagnostics from this hospitalization (including imaging, microbiology, ancillary and laboratory) are listed below for reference.    Significant Diagnostic Studies: Ct Abdomen Pelvis Wo Contrast  Result Date: 12/07/2018 CLINICAL DATA:  Back pain. History of renal failure. EXAM: CT ABDOMEN AND PELVIS WITHOUT CONTRAST TECHNIQUE: Multidetector CT imaging of the abdomen and pelvis was performed following the standard protocol without IV contrast. COMPARISON:  12/13/2017 FINDINGS: Lower chest: Hazy ground-glass opacities in bilateral lung bases. Bibasilar atelectasis. Small pericardial effusion. Calcific atherosclerotic disease of  the aorta and coronary arteries. Hepatobiliary: No focal liver abnormality is seen. No gallstones, gallbladder wall thickening, or biliary dilatation. Pancreas: Unremarkable. No pancreatic ductal dilatation or surrounding inflammatory changes. Spleen: Normal in size without focal abnormality. Adrenals/Urinary Tract: Adrenal glands are unremarkable. Kidneys are normal, without renal calculi, focal lesion, or hydronephrosis. Diffuse thickening of the urinary bladder wall. Stomach/Bowel: Stomach is within normal limits. Appendix appears normal. No evidence of bowel wall thickening, distention, or inflammatory changes. Vascular/Lymphatic: Aortic atherosclerosis. No enlarged abdominal or pelvic lymph nodes. Reproductive: Enlargement of the prostate gland. Other: Moderate amount of abdominopelvic ascites. Ascites containing left inguinal hernia. Musculoskeletal: Diffusely mottled appearance of the osseous structures. Mild compression deformities of virtually all vertebral bodies of the lumbosacral spine and lower thoracic spine. Healing or healed right-sided rib fractures. IMPRESSION: 1. Moderate amount of abdominopelvic ascites. 2. Hazy ground-glass opacities in bilateral lung bases, likely infectious or inflammatory. 3. Small pericardial effusion. 4. Diffusely mottled appearance of the osseous structures with mild compression deformities of virtually all vertebral bodies of the lumbosacral spine, mildly progressed. New compression deformity of T10 and T12 vertebral bodies. These findings may represent severe osteopenia or diffuse osseous metastatic disease. 5. Enlargement of the prostate gland with mild diffuse thickening of the urinary bladder wall, possibly due to outlet obstruction. Please correlate to serum PSA values. Electronically Signed   By: Fidela Salisbury M.D.   On: 12/07/2018 01:47   Dg Chest 2 View  Result Date: 12/08/2018 CLINICAL DATA:  Chest and back pain EXAM: CHEST - 2 VIEW COMPARISON:  12/16/2018 chest radiograph and chest CT April 18, 2018 FINDINGS: There is a shallow degree of inspiration. There is scarring in each lung base, stable. There is no frank edema or consolidation. Heart is  upper normal in size with pulmonary vascularity normal. No adenopathy. There is degenerative change in each shoulder. IMPRESSION: Stable bibasilar scarring. No new opacity evident. Stable cardiac silhouette. Electronically Signed   By: Lowella Grip III M.D.   On: 12/08/2018 14:01   Dg Chest 2 View  Result Date: 11/28/2018 CLINICAL DATA:  Cough productive of gray brown mucus for 3 weeks, generalized weakness, nausea without vomiting, history type II diabetes mellitus, CLL, hypertension EXAM: CHEST - 2 VIEW COMPARISON:  03/26/2018 FINDINGS: Normal heart size, mediastinal contours, and pulmonary vascularity. Decreased lung volumes with bibasilar atelectasis slightly greater on LEFT. Central peribronchial thickening. Upper lungs clear. No pleural effusion or pneumothorax. Bones demineralized. IMPRESSION: Low lung volumes with bibasilar atelectasis and mild bronchitic changes. Electronically Signed   By: Lavonia Dana M.D.   On: 11/20/2018 22:32   US Biopsy (kidney)  Result Date: 12/11/2018 INDICATION: Worsening renal insufficiency. Evaluate for kappa based infiltrative tubular damage / LCDD vs ATN vs MCD vs membranous glomerular nephritis. Please perform ultrasound-guided random renal biopsy for tissue diagnostic purposes. EXAM: ULTRASOUND GUIDED RENAL BIOPSY COMPARISON:  CT abdomen and pelvis - 12/07/2018 MEDICATIONS: None. ANESTHESIA/SEDATION: Fentanyl 25 mcg IV; Versed 0.5 mg IV Total Moderate Sedation time: 10 minutes; The patient was continuously monitored during the procedure by the interventional radiology nurse under my direct supervision. COMPLICATIONS: None immediate. PROCEDURE: Informed written consent was obtained from the patient after a discussion of the risks, benefits and alternatives to  treatment. The patient understands and consents the procedure. A timeout was performed prior to the initiation of the procedure. Ultrasound scanning was performed of the bilateral flanks. The inferior pole of the left kidney was selected for biopsy due to location and sonographic window. The procedure was planned. The operative site was prepped and draped in the usual sterile fashion. The overlying soft tissues were anesthetized with 1% lidocaine with epinephrine. A 17 gauge core needle biopsy device was advanced into the inferior cortex of the left kidney and 2 core biopsies were obtained under direct ultrasound guidance. Real time pathologic review confirmed adequate tissue acquisition. Images were saved for documentation purposes. The biopsy device was removed and hemostasis was obtained with manual compression. Post procedural scanning was negative for significant post procedural hemorrhage or additional complication. A dressing was placed. The patient tolerated the procedure well without immediate post procedural complication. IMPRESSION: Technically successful ultrasound guided left renal biopsy. Electronically Signed   By: Sandi Mariscal M.D.   On: 12/11/2018 13:49    Microbiology: Recent Results (from the past 240 hour(s))  Culture, Urine     Status: None   Collection Time: 12/10/18  9:00 AM  Result Value Ref Range Status   Specimen Description URINE, CLEAN CATCH  Final   Special Requests NONE  Final   Culture   Final    NO GROWTH Performed at [facility], [address] [place], [place] [address]    Report Status 12/11/2018 FINAL  Final     Labs: Basic Metabolic Panel: Recent Labs  Lab 12/08/18 0750 12/09/18 0359 12/10/18 0510 12/11/18 0508 01/07/19 0321  NA 131* 134* 133* 133* 133*  K 4.5 4.5 5.4* 5.4* 6.2*  CL 104 106 105 106 104  CO2 18* 19* 20* 18* 15*  GLUCOSE 99 72 199* 212* 266*  BUN 66* 69* 86* 104* 126*  CREATININE 2.51* 2.55* 2.96* 3.94* 4.89*  CALCIUM 9.3 9.6  9.0 8.7* 8.8*   Liver Function Tests: Recent Labs  Lab 12/07/18 0333 12/08/18  8127 12/09/18 0359 12/11/18 0508 12/13/18 0321  AST 130* 133* 116* 76* 75*  ALT 81* 88* 80* 61* 60*  ALKPHOS 320* 344* 346* 360* 361*  BILITOT 4.2* 4.7* 5.0* 5.8* 6.3*  PROT 4.6* 4.6* 4.5* 4.3* 4.1*  ALBUMIN 1.7* 1.7* 1.6* 1.4* 1.4*   Recent Labs  Lab 11/24/2018 2144  LIPASE 38   Recent Labs  Lab 12/07/18 0333  AMMONIA 43*   CBC: Recent Labs  Lab 12/08/18 0750 12/09/18 0359 12/10/18 0510 12/11/18 0508 12/13/2018 0321  WBC 23.7* 24.0* 26.3* 23.6* 24.7*  NEUTROABS 11.1*  --  20.3* 20.5* 14.5*  HGB 9.0* 9.0* 9.4* 9.1* 8.8*  HCT 26.0* 26.5* 27.0* 26.4* 24.6*  MCV 64.7* 63.4* 63.5* 63.0* 63.1*  PLT 431* 387 405* 370 358   Cardiac Enzymes: No results for input(s): CKTOTAL, CKMB, CKMBINDEX, TROPONINI in the last 168 hours. D-Dimer No results for input(s): DDIMER in the last 72 hours. BNP: Invalid input(s): POCBNP CBG: Recent Labs  Lab 12/11/18 0700 12/11/18 1131 12/11/18 1652 12/11/18 2146 12-13-18 0528  GLUCAP 189* 198* 186* 200* 271*   Anemia work up No results for input(s): VITAMINB12, FOLATE, FERRITIN, TIBC, IRON, RETICCTPCT in the last 72 hours. Urinalysis    Component Value Date/Time   COLORURINE AMBER (A) 12/07/2018 0544   APPEARANCEUR HAZY (A) 12/07/2018 0544   LABSPEC 1.026 12/07/2018 0544   PHURINE 5.0 12/07/2018 0544   GLUCOSEU NEGATIVE 12/07/2018 0544   HGBUR MODERATE (A) 12/07/2018 0544   BILIRUBINUR NEGATIVE 12/07/2018 0544   KETONESUR NEGATIVE 12/07/2018 0544   PROTEINUR 100 (A) 12/07/2018 0544   UROBILINOGEN 1 08/27/2013 1110   NITRITE NEGATIVE 12/07/2018 0544   LEUKOCYTESUR NEGATIVE 12/07/2018 0544   Sepsis Labs Invalid input(s): PROCALCITONIN,  WBC,  LACTICIDVEN     SIGNED:  Shelly Coss, MD  [facility] 13-Dec-2018, 10:45 AM Pager [phone]  If 7PM-7AM, please contact night-coverage [email or web address] Password [email or web address]

## 2018-12-17 NOTE — Procedures (Signed)
Intubation Procedure Note THIAGO RAGSDALE [id number] [birth year]  Procedure: Intubation Indications: Respiratory insufficiency  Procedure Details Consent: Risks of procedure as well as the alternatives and risks of each were explained to the (patient/caregiver).  Consent for procedure obtained. and Unable to obtain consent because of emergent medical necessity. Time Out: Verified patient identification, verified procedure, site/side was marked, verified correct patient position, special equipment/implants available, medications/allergies/relevent history reviewed, required imaging and test results available.  Performed  Maximum sterile technique was used including antiseptics, gloves and hand hygiene.  MAC and 4    Evaluation Hemodynamic Status: Unstable; O2 sats: Unstable Patient's Current Condition: unstable Complications: No apparent complications Patient did tolerate procedure well. Chest X-ray ordered to verify placement.  CXR: pending.  Patient is an active [facility] on my arrival. CPR in progress. ACLS protocol is being initiated . Patient was intubated by Self, positive color change noted. BBS to auscultation confirmed. No apparent complications with Endotracheal Intubation noted.   Leigh Aurora, B.S, RRT, RCP 01/09/2019

## 2018-12-17 NOTE — Progress Notes (Signed)
Family left the bedside. Wife did not want to take patients wedding band, ring left with the body. Transported to morgue

## 2018-12-17 NOTE — Progress Notes (Signed)
Late entry,called to room by nurse tech taking vital signs at 0525,Pt not breathing,pt found unresponsive in  Bed,no heart or breath sounds,pt warm to touch,CPR started,code blue called and team arrived 0527. Time of death 93 ,Next of kin notified,autopsy declined, Vivianne Master [facility] [place] [facility] notified record number [phone].Next of kin/family at beside.

## 2018-12-17 DEATH — deceased

## 2018-12-25 ENCOUNTER — Other Ambulatory Visit: Payer: Medicare Other

## 2018-12-25 ENCOUNTER — Ambulatory Visit: Payer: Medicare Other | Admitting: Oncology

## 2019-01-04 ENCOUNTER — Other Ambulatory Visit: Payer: Self-pay | Admitting: Internal Medicine

## 2019-01-14 ENCOUNTER — Ambulatory Visit: Payer: Self-pay | Admitting: Internal Medicine

## 2019-04-25 IMAGING — CR CHEST - 2 VIEW
2 series · 2 of 2 positions shown · non-contrast
Comparison: December 06, 2018 chest radiograph and chest CT April 18, 2018

CLINICAL DATA: Chest and back pain

EXAM:
CHEST - 2 VIEW

[chest ap]
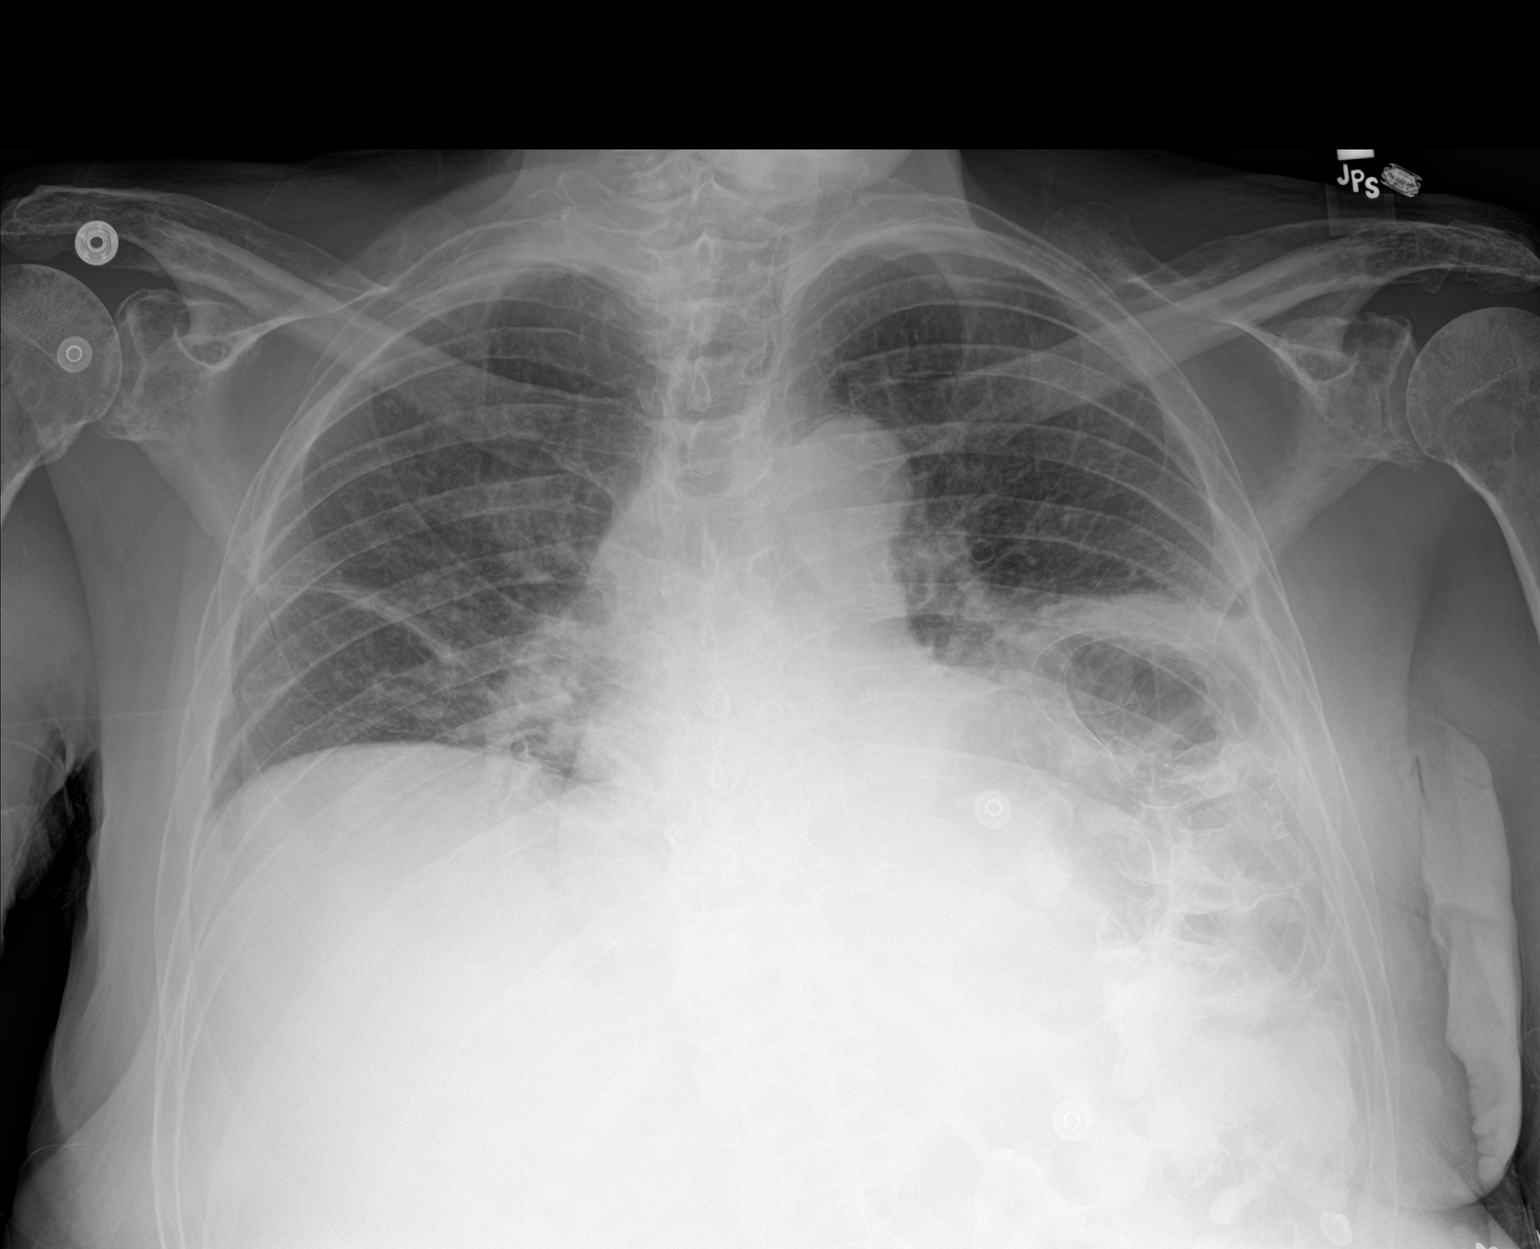

[chest lat]
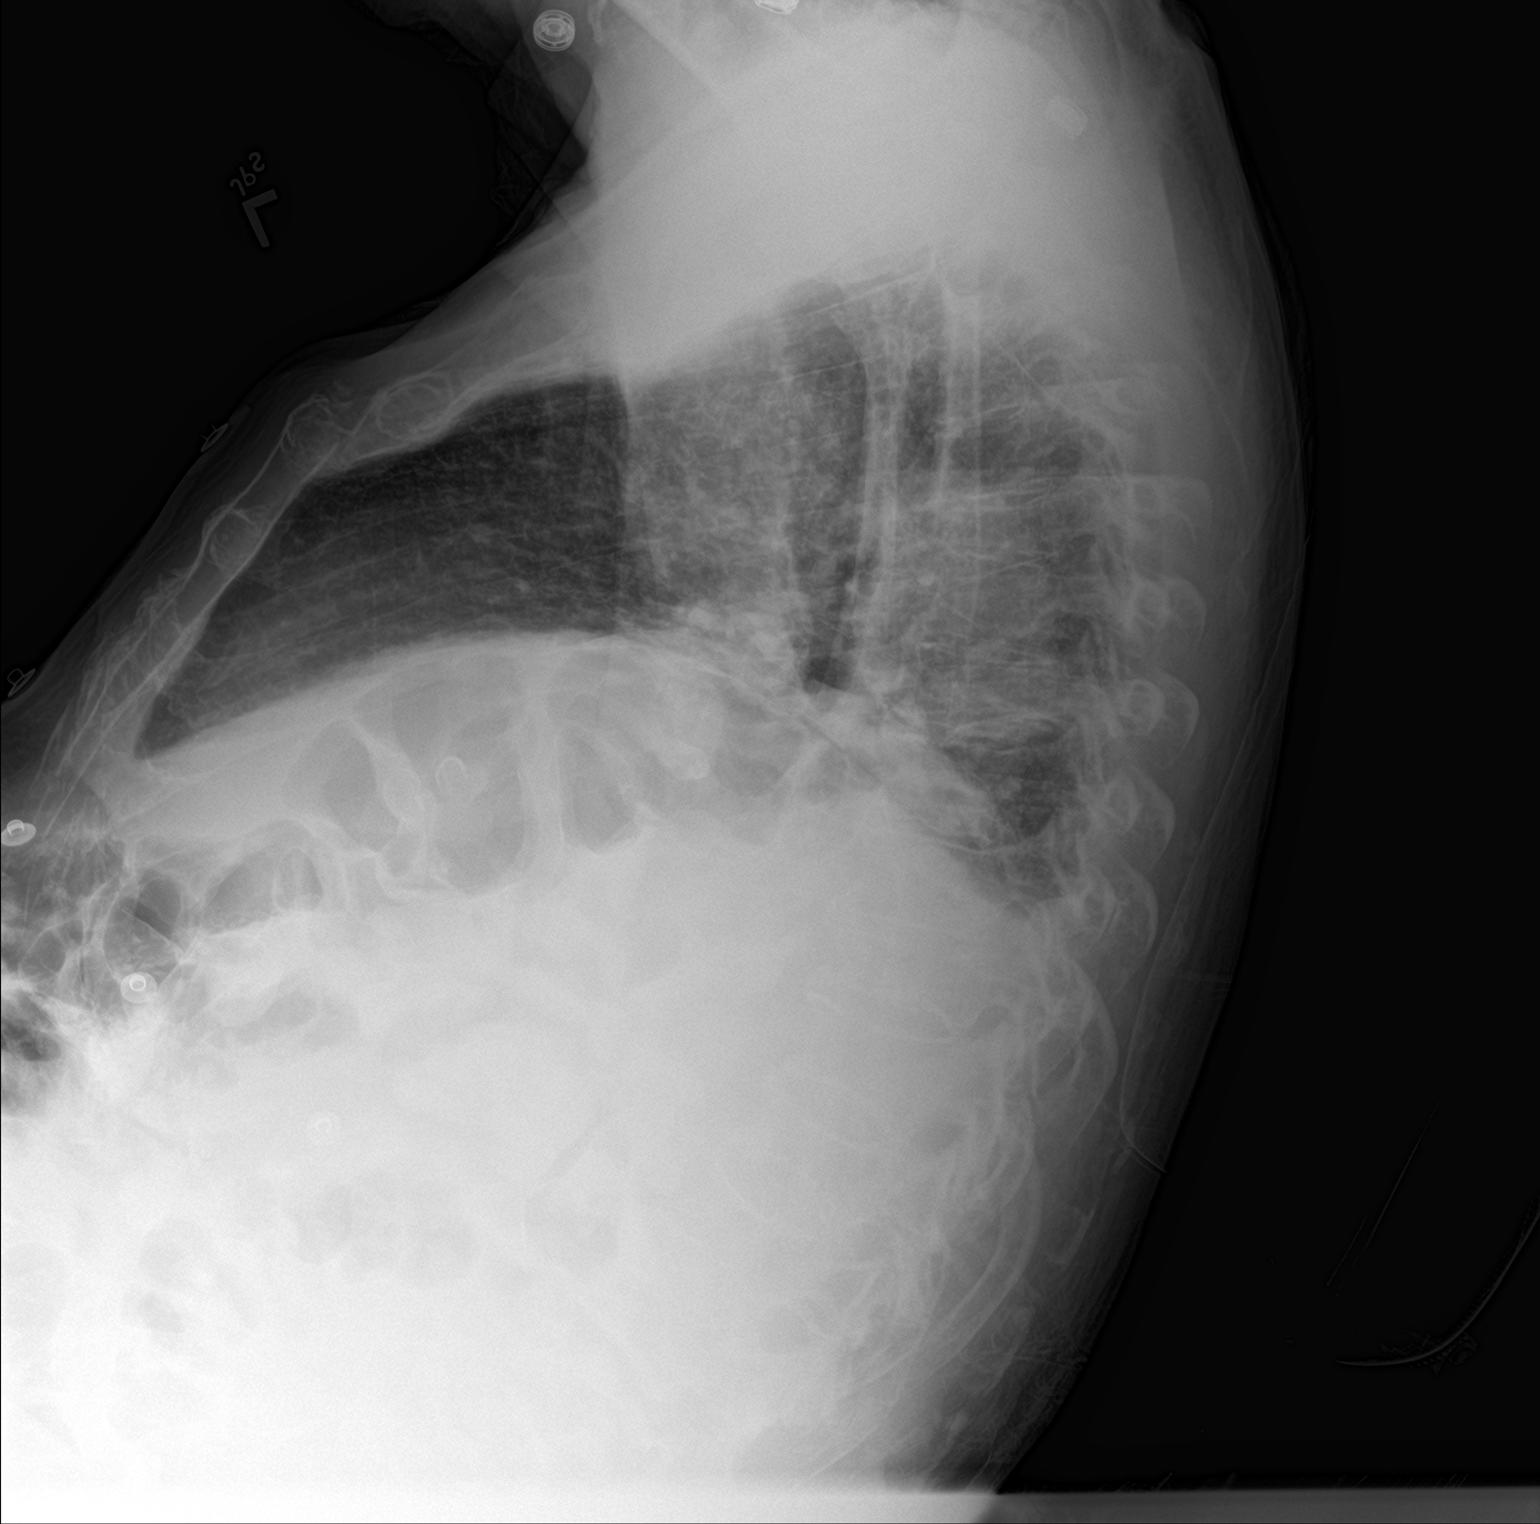

[2 of 2 positions shown; findings below may reference images not displayed]

FINDINGS: There is a shallow degree of inspiration. There is scarring in each
lung base, stable. There is no frank edema or consolidation. Heart
is upper normal in size with pulmonary vascularity normal. No
adenopathy. There is degenerative change in each shoulder.
IMPRESSION: Stable bibasilar scarring. No new opacity evident. Stable cardiac
silhouette.

## 2019-06-16 ENCOUNTER — Encounter: Payer: Self-pay | Admitting: Internal Medicine

## 2019-07-29 ENCOUNTER — Encounter: Payer: Self-pay | Admitting: Internal Medicine

## 2019-10-14 ENCOUNTER — Ambulatory Visit: Payer: Self-pay | Admitting: Adult Health

## 2023-01-08 ENCOUNTER — Encounter (HOSPITAL_COMMUNITY): Payer: Self-pay | Admitting: General Practice

## 2024-04-09 ENCOUNTER — Other Ambulatory Visit: Payer: Self-pay
